# Patient Record
Sex: Female | Born: 1950
Health system: Southern US, Community
[De-identification: ages and names within clinical notes are randomized; demographics above are authoritative.]

## PROBLEM LIST (undated history)

## (undated) DIAGNOSIS — I10 Essential (primary) hypertension: Secondary | ICD-10-CM

## (undated) DIAGNOSIS — E119 Type 2 diabetes mellitus without complications: Secondary | ICD-10-CM

## (undated) DIAGNOSIS — M1990 Unspecified osteoarthritis, unspecified site: Secondary | ICD-10-CM

## (undated) DIAGNOSIS — R42 Dizziness and giddiness: Secondary | ICD-10-CM

## (undated) DIAGNOSIS — R002 Palpitations: Secondary | ICD-10-CM

## (undated) DIAGNOSIS — E785 Hyperlipidemia, unspecified: Secondary | ICD-10-CM

## (undated) DIAGNOSIS — K219 Gastro-esophageal reflux disease without esophagitis: Secondary | ICD-10-CM

## (undated) DIAGNOSIS — I2510 Atherosclerotic heart disease of native coronary artery without angina pectoris: Secondary | ICD-10-CM

## (undated) HISTORY — DX: Hyperlipidemia, unspecified: E78.5

## (undated) HISTORY — DX: Essential (primary) hypertension: I10

## (undated) HISTORY — DX: Palpitations: R00.2

## (undated) HISTORY — DX: Dizziness and giddiness: R42

## (undated) HISTORY — DX: Type 2 diabetes mellitus without complications: E11.9

---

## 1998-11-04 ENCOUNTER — Other Ambulatory Visit: Admission: RE | Admit: 1998-11-04 | Discharge: 1998-11-04 | Payer: Self-pay | Admitting: Family Medicine

## 1999-12-29 ENCOUNTER — Other Ambulatory Visit: Admission: RE | Admit: 1999-12-29 | Discharge: 1999-12-29 | Payer: Self-pay | Admitting: Family Medicine

## 2001-03-20 ENCOUNTER — Other Ambulatory Visit: Admission: RE | Admit: 2001-03-20 | Discharge: 2001-03-20 | Payer: Self-pay | Admitting: Family Medicine

## 2001-10-21 ENCOUNTER — Emergency Department (HOSPITAL_COMMUNITY): Admission: EM | Admit: 2001-10-21 | Discharge: 2001-10-21 | Payer: Self-pay | Admitting: Internal Medicine

## 2001-10-21 ENCOUNTER — Encounter: Payer: Self-pay | Admitting: Internal Medicine

## 2002-04-19 ENCOUNTER — Other Ambulatory Visit: Admission: RE | Admit: 2002-04-19 | Discharge: 2002-04-19 | Payer: Self-pay | Admitting: Family Medicine

## 2003-10-22 ENCOUNTER — Other Ambulatory Visit: Admission: RE | Admit: 2003-10-22 | Discharge: 2003-10-22 | Payer: Self-pay | Admitting: Family Medicine

## 2005-09-28 ENCOUNTER — Other Ambulatory Visit: Admission: RE | Admit: 2005-09-28 | Discharge: 2005-09-28 | Payer: Self-pay | Admitting: Family Medicine

## 2007-01-20 ENCOUNTER — Other Ambulatory Visit: Admission: RE | Admit: 2007-01-20 | Discharge: 2007-01-20 | Payer: Self-pay | Admitting: Family Medicine

## 2008-04-18 ENCOUNTER — Emergency Department (HOSPITAL_COMMUNITY): Admission: EM | Admit: 2008-04-18 | Discharge: 2008-04-18 | Payer: Self-pay | Admitting: Emergency Medicine

## 2010-12-06 ENCOUNTER — Encounter: Payer: Self-pay | Admitting: Family Medicine

## 2011-08-12 LAB — DIFFERENTIAL
Basophils Absolute: 0
Basophils Relative: 1
Eosinophils Relative: 4
Monocytes Absolute: 0.4
Neutro Abs: 2.8

## 2011-08-12 LAB — CBC
HCT: 37.1
Hemoglobin: 12.7
MCHC: 34.4
MCV: 82
Platelets: 359
RBC: 4.52
RDW: 15.4
WBC: 4.9

## 2011-08-12 LAB — URINALYSIS, ROUTINE W REFLEX MICROSCOPIC
Bilirubin Urine: NEGATIVE
Glucose, UA: NEGATIVE
Hgb urine dipstick: NEGATIVE
Ketones, ur: NEGATIVE
Nitrite: NEGATIVE
Protein, ur: NEGATIVE
Specific Gravity, Urine: 1.015
Urobilinogen, UA: 0.2
pH: 6.5

## 2011-08-12 LAB — BASIC METABOLIC PANEL
BUN: 10
CO2: 28
Calcium: 9.4
Chloride: 106
Creatinine, Ser: 0.69
GFR calc Af Amer: >60
GFR calc non Af Amer: >60
Glucose, Bld: 129 — ABNORMAL HIGH
Potassium: 4.3
Sodium: 141

## 2013-04-16 ENCOUNTER — Telehealth: Payer: Self-pay | Admitting: Nurse Practitioner

## 2013-04-16 NOTE — Telephone Encounter (Signed)
APPT MADE WITH MMM TOMORROW

## 2013-04-17 ENCOUNTER — Other Ambulatory Visit: Payer: Self-pay | Admitting: *Deleted

## 2013-04-17 ENCOUNTER — Encounter: Payer: Self-pay | Admitting: Nurse Practitioner

## 2013-04-17 ENCOUNTER — Ambulatory Visit (INDEPENDENT_AMBULATORY_CARE_PROVIDER_SITE_OTHER): Payer: BC Managed Care – PPO | Admitting: Nurse Practitioner

## 2013-04-17 VITALS — BP 142/82 | Temp 98.1°F | Ht 67.0 in | Wt 236.0 lb

## 2013-04-17 DIAGNOSIS — Z7985 Long-term (current) use of injectable non-insulin antidiabetic drugs: Secondary | ICD-10-CM | POA: Insufficient documentation

## 2013-04-17 DIAGNOSIS — E785 Hyperlipidemia, unspecified: Secondary | ICD-10-CM | POA: Insufficient documentation

## 2013-04-17 DIAGNOSIS — E119 Type 2 diabetes mellitus without complications: Secondary | ICD-10-CM | POA: Insufficient documentation

## 2013-04-17 DIAGNOSIS — I152 Hypertension secondary to endocrine disorders: Secondary | ICD-10-CM | POA: Insufficient documentation

## 2013-04-17 DIAGNOSIS — I10 Essential (primary) hypertension: Secondary | ICD-10-CM

## 2013-04-17 LAB — COMPLETE METABOLIC PANEL WITH GFR
AST: 18 U/L (ref 0–37)
Alkaline Phosphatase: 67 U/L (ref 39–117)
BUN: 8 mg/dL (ref 6–23)
Creat: 0.75 mg/dL (ref 0.50–1.10)

## 2013-04-17 MED ORDER — SITAGLIPTIN PHOS-METFORMIN HCL 50-1000 MG PO TABS: 1.0000 | ORAL_TABLET | Freq: Every morning | ORAL | Status: DC

## 2013-04-17 MED ORDER — GLIPIZIDE ER 10 MG PO TB24: 10.0000 mg | ORAL_TABLET | Freq: Every day | ORAL | Status: DC

## 2013-04-17 MED ORDER — LISINOPRIL 20 MG PO TABS: 20.0000 mg | ORAL_TABLET | Freq: Every day | ORAL | Status: DC

## 2013-04-17 MED ORDER — METFORMIN HCL 1000 MG PO TABS: 1000.0000 mg | ORAL_TABLET | Freq: Every day | ORAL | Status: DC

## 2013-04-17 MED ORDER — ROSUVASTATIN CALCIUM 20 MG PO TABS: 20.0000 mg | ORAL_TABLET | Freq: Every day | ORAL | Status: DC

## 2013-04-17 MED ORDER — MECLIZINE HCL 25 MG PO TABS: 25.0000 mg | ORAL_TABLET | Freq: Three times a day (TID) | ORAL | Status: DC | PRN

## 2013-04-17 NOTE — Progress Notes (Signed)
Subjective:    Patient ID: Erica Evans, female    DOB: [birth year], 62 y.o.   MRN: [id number]  Hyperlipidemia This is a chronic problem. The current episode started more than 1 year ago. The problem is uncontrolled. Recent lipid tests were reviewed and are high. Exacerbating diseases include diabetes and obesity. There are no known factors aggravating her hyperlipidemia. Pertinent negatives include no chest pain or shortness of breath. Current antihyperlipidemic treatment includes statins. The current treatment provides moderate improvement of lipids. Compliance problems include adherence to diet and adherence to exercise.  Risk factors for coronary artery disease include hypertension, diabetes mellitus, obesity and post-menopausal.  Hypertension This is a chronic problem. The current episode started more than 1 year ago. The problem is unchanged. The problem is uncontrolled. Pertinent negatives include no blurred vision, chest pain, headaches, peripheral edema, shortness of breath or sweats. There are no associated agents to hypertension. Risk factors for coronary artery disease include diabetes mellitus, dyslipidemia, obesity, post-menopausal state and sedentary lifestyle. Past treatments include ACE inhibitors. The current treatment provides moderate improvement. Compliance problems include diet and exercise.   Diabetes She presents for her follow-up diabetic visit. She has type 2 diabetes mellitus. No MedicAlert identification noted. The initial diagnosis of diabetes was made 10 years ago. There are no hypoglycemic associated symptoms. Pertinent negatives for hypoglycemia include no headaches or sweats. Pertinent negatives for diabetes include no blurred vision, no chest pain, no polydipsia, no polyphagia, no polyuria, no weakness and no weight loss. There are no hypoglycemic complications. Symptoms are stable. There are no diabetic complications. Risk factors for coronary artery disease include  dyslipidemia, hypertension, obesity, post-menopausal and sedentary lifestyle. Current diabetic treatment includes oral agent (triple therapy). She is compliant with treatment all of the time. Her weight is stable. She is following a generally healthy diet. When asked about meal planning, she reported none. She has not had a previous visit with a dietician. She rarely participates in exercise. Her breakfast blood glucose is taken between 8-9 am. Her breakfast blood glucose range is generally 130-140 mg/dl. Her highest blood glucose is 140-180 mg/dl. Her overall blood glucose range is 130-140 mg/dl. An ACE inhibitor/angiotensin II receptor blocker is being taken. She does not see a podiatrist.Eye exam is current (over a year ago- patient encouraged to make appointment.).      Review of Systems  Constitutional: Negative for weight loss.  Eyes: Negative for blurred vision.  Respiratory: Negative for shortness of breath.   Cardiovascular: Negative for chest pain.  Endocrine: Negative for polydipsia, polyphagia and polyuria.  Neurological: Negative for weakness and headaches.  All other systems reviewed and are negative.       Objective:   Physical Exam  Constitutional: She is oriented to person, place, and time. She appears well-developed and well-nourished.  HENT:  Nose: Nose normal.  Mouth/Throat: Oropharynx is clear and moist.  Eyes: EOM are normal.  Neck: Trachea normal, normal range of motion and full passive range of motion without pain. Neck supple. No JVD present. Carotid bruit is not present. No thyromegaly present.  Cardiovascular: Normal rate, normal heart sounds and intact distal pulses.  Exam reveals no gallop and no friction rub.   No murmur heard. Regular irregularity  Pulmonary/Chest: Effort normal and breath sounds normal.  Abdominal: Soft. Bowel sounds are normal. She exhibits no distension and no mass. There is no tenderness.  Musculoskeletal: Normal range of motion.   Lymphadenopathy:    She has no cervical adenopathy.  Neurological: She  is alert and oriented to person, place, and time. She has normal reflexes.  Skin: Skin is warm and dry.  Psychiatric: She has a normal mood and affect. Her behavior is normal. Judgment and thought content normal.  BP 142/82  Temp(Src) 98.1 F (36.7 C) (Oral)  Ht 5\' 7"  (1.702 m)  Wt 236 lb (107.049 kg)  BMI 36.95 kg/m2 See diabetic foot exam documentation Results for orders placed in visit on 04/17/13  POCT GLYCOSYLATED HEMOGLOBIN (HGB A1C)      Result Value Range   Hemoglobin A1C 7.4%            Assessment & Plan:  1. HTN (hypertension) Low NA+ diet - COMPLETE METABOLIC PANEL WITH GFR - lisinopril (PRINIVIL,ZESTRIL) 20 MG tablet; Take 1 tablet (20 mg total) by mouth daily.  Dispense: 90 tablet; Refill: 1  2. Other and unspecified hyperlipidemia Low fat diet and exercise - NMR Lipoprofile with Lipids - rosuvastatin (CRESTOR) 20 MG tablet; Take 1 tablet (20 mg total) by mouth daily.  Dispense: 90 tablet; Refill: 1  3. Diabetes Count carbs - POCT glycosylated hemoglobin (Hb A1C) - glipiZIDE (GLUCOTROL XL) 10 MG 24 hr tablet; Take 1 tablet (10 mg total) by mouth daily.  Dispense: 90 tablet; Refill: 1 - metFORMIN (GLUCOPHAGE) 1000 MG tablet; Take 1 tablet (1,000 mg total) by mouth at bedtime.  Dispense: 90 tablet; Refill: 1 - sitaGLIPtan-metformin (JANUMET) 50-1000 MG per tablet; Take 1 tablet by mouth every morning.  Dispense: 90 tablet; Refill: 1  Mary-Margaret Daphine Deutscher, FNP

## 2013-04-17 NOTE — Patient Instructions (Signed)
Diets for Diabetes, Food Labeling Look at food labels to help you decide how much of a product you can eat. You will want to check the amount of total carbohydrate in a serving to see how the food fits into your meal plan. In the list of ingredients, the ingredient present in the largest amount by weight must be listed first, followed by the other ingredients in descending order. STANDARD OF IDENTITY Most products have a list of ingredients. However, foods that the Food and Drug Administration (FDA) has given a standard of identity do not need a list of ingredients. A standard of identity means that a food must contain certain ingredients if it is called a particular name. Examples are mayonnaise, peanut butter, ketchup, jelly, and cheese. LABELING TERMS There are many terms found on food labels. Some of these terms have specific definitions. Some terms are regulated by the FDA, and the FDA has clearly specified how they can be used. Others are not regulated or well-defined and can be misleading and confusing. SPECIFICALLY DEFINED TERMS Nutritive Sweetener.  A sweetener that contains calories,such as table sugar or honey. Nonnutritive Sweetener.  A sweetener with few or no calories,such as saccharin, aspartame, sucralose, and cyclamate. LABELING TERMS REGULATED BY THE FDA Free.  The product contains only a tiny or small amount of fat, cholesterol, sodium, sugar, or calories. For example, a "fat-free" product will contain less than 0.5 g of fat per serving. Low.  A food described as "low" in fat, saturated fat, cholesterol, sodium, or calories could be eaten fairly often without exceeding dietary guidelines. For example, "low in fat" means no more than 3 g of fat per serving. Lean.  "Lean" and "extra lean" are U.S. Department of Agriculture (USDA) terms for use on meat and poultry products. "Lean" means the product contains less than 10 g of fat, 4 g of saturated fat, and 95 mg of cholesterol  per serving. "Lean" is not as low in fat as a product labeled "low." Extra Lean.  "Extra lean" means the product contains less than 5 g of fat, 2 g of saturated fat, and 95 mg of cholesterol per serving. While "extra lean" has less fat than "lean," it is still higher in fat than a product labeled "low." Reduced, Less, Fewer.  A diet product that contains 25% less of a nutrient or calories than the regular version. For example, hot dogs might be labeled "25% less fat than our regular hot dogs." Light/Lite.  A diet product that contains  fewer calories or  the fat of the original. For example, "light in sodium" means a product with  the usual sodium. More.  One serving contains at least 10% more of the daily value of a vitamin, mineral, or fiber than usual. Good Source Of.  One serving contains 10% to 19% of the daily value for a particular vitamin, mineral, or fiber. Excellent Source Of.  One serving contains 20% or more of the daily value for a particular nutrient. Other terms used might be "high in" or "rich in." Enriched or Fortified.  The product contains added vitamins, minerals, or protein. Nutrition labeling must be used on enriched or fortified foods. Imitation.  The product has been altered so that it is lower in protein, vitamins, or minerals than the usual food,such as imitation peanut butter. Total Fat.  The number listed is the total of all fat found in a serving of the product. Under total fat, food labels must list saturated fat and   trans fat, which are associated with raising bad cholesterol and an increased risk of heart blood vessel disease. Saturated Fat.  Mainly fats from animal-based sources. Some examples are red meat, cheese, cream, whole milk, and coconut oil. Trans Fat.  Found in some fried snack foods, packaged foods, and fried restaurant foods. It is recommended you eat as close to 0 g of trans fat as possible, since it raises bad cholesterol and lowers  good cholesterol. Polyunsaturated and Monounsaturated Fats.  More healthful fats. These fats are from plant sources. Total Carbohydrate.  The number of carbohydrate grams in a serving of the product. Under total carbohydrate are listed the other carbohydrate sources, such as dietary fiber and sugars. Dietary Fiber.  A carbohydrate from plant sources. Sugars.  Sugars listed on the label contain all naturally occurring sugars as well as added sugars. LABELING TERMS NOT REGULATED BY THE FDA Sugarless.  Table sugar (sucrose) has not been added. However, the manufacturer may use another form of sugar in place of sucrose to sweeten the product. For example, sugar alcohols are used to sweeten foods. Sugar alcohols are a form of sugar but are not table sugar. If a product contains sugar alcohols in place of sucrose, it can still be labeled "sugarless." Low Salt, Salt-Free, Unsalted, No Salt, No Salt Added, Without Added Salt.  Food that is usually processed with salt has been made without salt. However, the food may contain sodium-containing additives, such as preservatives, leavening agents, or flavorings. Natural.  This term has no legal meaning. Organic.  Foods that are certified as organic have been inspected and approved by the USDA to ensure they are produced without pesticides, fertilizers containing synthetic ingredients, bioengineering, or ionizing radiation. Document Released: 11/04/2003 Document Revised: 01/24/2012 Document Reviewed: 05/22/2009 [employer] Patient Information 2014 [employer].  

## 2013-04-18 LAB — NMR LIPOPROFILE WITH LIPIDS
Cholesterol, Total: 119 mg/dL (ref <200)
LDL (calc): 40 mg/dL (ref <100)
LDL Particle Number: 704 nmol/L (ref <1000)
LP-IR Score: 63 — ABNORMAL HIGH (ref <=45)
Triglycerides: 148 mg/dL (ref <150)
VLDL Size: 48.3 nm — ABNORMAL HIGH (ref <=46.6)

## 2013-04-19 ENCOUNTER — Telehealth: Payer: Self-pay | Admitting: Nurse Practitioner

## 2013-04-19 NOTE — Telephone Encounter (Signed)
lmtcb- 6/5-jhb

## 2013-04-25 ENCOUNTER — Other Ambulatory Visit (INDEPENDENT_AMBULATORY_CARE_PROVIDER_SITE_OTHER): Payer: BC Managed Care – PPO

## 2013-04-25 DIAGNOSIS — R7989 Other specified abnormal findings of blood chemistry: Secondary | ICD-10-CM

## 2013-04-25 LAB — BASIC METABOLIC PANEL WITH GFR
Chloride: 100 mEq/L (ref 96–112)
Creat: 0.72 mg/dL (ref 0.50–1.10)
GFR, Est Non African American: >89 mL/min
Potassium: 4.8 mEq/L (ref 3.5–5.3)

## 2013-04-25 NOTE — Progress Notes (Unsigned)
Patient came in for re-check for potassium

## 2013-05-03 ENCOUNTER — Telehealth: Payer: Self-pay | Admitting: Nurse Practitioner

## 2013-05-03 NOTE — Telephone Encounter (Signed)
Samples up front 

## 2013-05-08 ENCOUNTER — Telehealth: Payer: Self-pay

## 2013-05-08 ENCOUNTER — Telehealth: Payer: Self-pay | Admitting: Nurse Practitioner

## 2013-05-08 DIAGNOSIS — I499 Cardiac arrhythmia, unspecified: Secondary | ICD-10-CM

## 2013-05-08 NOTE — Telephone Encounter (Signed)
Want to get referral now for irregular heartbeat to [facility]

## 2013-05-08 NOTE — Telephone Encounter (Signed)
Patient aware referral made.

## 2013-05-08 NOTE — Telephone Encounter (Signed)
Referral made 

## 2013-07-09 ENCOUNTER — Ambulatory Visit (INDEPENDENT_AMBULATORY_CARE_PROVIDER_SITE_OTHER): Payer: BC Managed Care – PPO | Admitting: Cardiovascular Disease

## 2013-07-09 ENCOUNTER — Encounter: Payer: Self-pay | Admitting: Cardiovascular Disease

## 2013-07-09 ENCOUNTER — Other Ambulatory Visit: Payer: Self-pay

## 2013-07-09 ENCOUNTER — Encounter (INDEPENDENT_AMBULATORY_CARE_PROVIDER_SITE_OTHER): Payer: BC Managed Care – PPO

## 2013-07-09 ENCOUNTER — Encounter: Payer: Self-pay | Admitting: *Deleted

## 2013-07-09 ENCOUNTER — Ambulatory Visit (HOSPITAL_COMMUNITY): Payer: BC Managed Care – PPO | Attending: Internal Medicine | Admitting: Radiology

## 2013-07-09 VITALS — BP 152/72 | HR 65 | Wt 246.0 lb

## 2013-07-09 DIAGNOSIS — R002 Palpitations: Secondary | ICD-10-CM | POA: Insufficient documentation

## 2013-07-09 DIAGNOSIS — I4510 Unspecified right bundle-branch block: Secondary | ICD-10-CM | POA: Insufficient documentation

## 2013-07-09 DIAGNOSIS — R079 Chest pain, unspecified: Secondary | ICD-10-CM | POA: Insufficient documentation

## 2013-07-09 DIAGNOSIS — Z87891 Personal history of nicotine dependence: Secondary | ICD-10-CM | POA: Insufficient documentation

## 2013-07-09 DIAGNOSIS — I10 Essential (primary) hypertension: Secondary | ICD-10-CM | POA: Insufficient documentation

## 2013-07-09 DIAGNOSIS — R0609 Other forms of dyspnea: Secondary | ICD-10-CM

## 2013-07-09 DIAGNOSIS — R9431 Abnormal electrocardiogram [ECG] [EKG]: Secondary | ICD-10-CM

## 2013-07-09 DIAGNOSIS — R0602 Shortness of breath: Secondary | ICD-10-CM

## 2013-07-09 DIAGNOSIS — E785 Hyperlipidemia, unspecified: Secondary | ICD-10-CM

## 2013-07-09 DIAGNOSIS — E119 Type 2 diabetes mellitus without complications: Secondary | ICD-10-CM | POA: Insufficient documentation

## 2013-07-09 DIAGNOSIS — R55 Syncope and collapse: Secondary | ICD-10-CM

## 2013-07-09 DIAGNOSIS — R0600 Dyspnea, unspecified: Secondary | ICD-10-CM | POA: Insufficient documentation

## 2013-07-09 NOTE — Assessment & Plan Note (Signed)
In setting of diabetes and multiple cardiac symptoms including palpitations and dyspnea needs myovue

## 2013-07-09 NOTE — Assessment & Plan Note (Signed)
Seems to be the root casue of her pressure and dyspnea  F/u event monitor  Intermitant nature requires event monitor and not holter

## 2013-07-09 NOTE — Progress Notes (Signed)
Patient ID: Erica Evans, female   DOB: 1951/11/06, 62 y.o.   MRN: [id number] 62 yo referred by primary for irregular heart beat.  CRF;s elevated lipids DM and HTN She has let her self go quite a bit.  Sedentary with weight gain.  Husband has hydrocephalus and she is caring for him  Palpitations most days.  Fluttering feeling in throat with dyspnea and pressure.  Exacerbated by exertion.  Has had pre syncope that has been atributed to vertigo in past No previous ETT.  Palpitatoins worse last few months.  Pressure in chest associated with rapid palpitations and fluttering.    ROS: Denies fever, malais, weight loss, blurry vision, decreased visual acuity, cough, sputum, SOB, hemoptysis, pleuritic pain, palpitaitons, heartburn, abdominal pain, melena, lower extremity edema, claudication, or rash.  All other systems reviewed and negative   General: Affect appropriate Obese white female HEENT: normal Neck supple with no adenopathy JVP normal no bruits no thyromegaly Lungs clear with no wheezing and good diaphragmatic motion Heart:  S1/S2 no murmur,rub, gallop or click PMI normal Abdomen: benighn, BS positve, no tenderness, no AAA no bruit.  No HSM or HJR Distal pulses intact with no bruits No edema Neuro non-focal Skin warm and dry No muscular weakness  Medications Current Outpatient Prescriptions  Medication Sig Dispense Refill  . Cholecalciferol (VITAMIN D) 2000 UNITS CAPS Take 1 capsule by mouth daily.      Marland Kitchen glipiZIDE (GLUCOTROL XL) 10 MG 24 hr tablet Take 1 tablet (10 mg total) by mouth daily.  90 tablet  1  . lisinopril (PRINIVIL,ZESTRIL) 20 MG tablet Take 1 tablet (20 mg total) by mouth daily.  90 tablet  1  . meclizine (ANTIVERT) 25 MG tablet Take 1 tablet (25 mg total) by mouth 3 (three) times daily as needed.  30 tablet  3  . rosuvastatin (CRESTOR) 20 MG tablet Take 10 mg by mouth daily.      . sitaGLIPtan-metformin (JANUMET) 50-1000 MG per tablet Take 1 tablet by mouth every  morning.  90 tablet  1  . Sulfamethoxazole-Trimethoprim (BACTRIM PO) Take by mouth as directed.       No current facility-administered medications for this visit.    Allergies Lipitor and Morphine and related  Family History: Family History  Problem Relation Age of Onset  . Coronary artery disease Mother   . Diabetes Mother   . Coronary artery disease Father   . Diabetes Sister     Social History: History   Social History  . Marital Status: Married    Spouse Name: N/A    Number of Children: N/A  . Years of Education: N/A   Occupational History  . Not on file.   Social History Main Topics  . Smoking status: Former Games developer  . Smokeless tobacco: Not on file  . Alcohol Use: No  . Drug Use: No  . Sexual Activity: Not on file   Other Topics Concern  . Not on file   Social History Narrative  . No narrative on file    Electrocardiogram: SR rate 65 Low voltage nonspecific ST/T wave changes  Assessment and Plan

## 2013-07-09 NOTE — Assessment & Plan Note (Signed)
Cholesterol is at goal.  Continue current dose of statin and diet Rx.  No myalgias or side effects.  F/U  LFT's in 6 months. Lab Results  Component Value Date   LDLCALC 40 04/17/2013

## 2013-07-09 NOTE — Assessment & Plan Note (Signed)
Well controlled.  Continue current medications and low sodium Dash type diet.  I suspect she will have HTN response to exercise

## 2013-07-09 NOTE — Assessment & Plan Note (Signed)
Discussed low carb diet.  Target hemoglobin A1c is 6.5 or less.  Continue current medications.  

## 2013-07-09 NOTE — Assessment & Plan Note (Signed)
Seems funcitonal F/U echo for RV and LV function

## 2013-07-09 NOTE — Progress Notes (Signed)
Patient ID: Erica Evans, female   DOB: 03-17-1951, 62 y.o.   MRN: [id number] E-Cardio verite 30 day cardiac event monitor applied to patient.

## 2013-07-09 NOTE — Progress Notes (Signed)
Echocardiogram performed by Melissa Al-Rammal, RVT, RDMS.   Erica Evans, RT-N

## 2013-07-09 NOTE — Patient Instructions (Signed)
Your physician recommends that you schedule a follow-up appointment in: NEXT AVAILABLE Your physician recommends that you continue on your current medications as directed. Please refer to the Current Medication list given to you today.  Your physician has requested that you have an echocardiogram. Echocardiography is a painless test that uses sound waves to create images of your heart. It provides your doctor with information about the size and shape of your heart and how well your heart's chambers and valves are working. This procedure takes approximately one hour. There are no restrictions for this procedure.  Your physician has recommended that you wear an event monitor. Event monitors are medical devices that record the heart's electrical activity. Doctors most often [place] these monitors to diagnose arrhythmias. Arrhythmias are problems with the speed or rhythm of the heartbeat. The monitor is a small, portable device. You can wear one while you do your normal daily activities. This is usually used to diagnose what is causing palpitations/syncope (passing out). X 2 WEEKS  Your physician has requested that you have en exercise stress myoview. For further information please visit [email or web address] Please follow instruction sheet, as given.

## 2013-07-10 ENCOUNTER — Other Ambulatory Visit (HOSPITAL_COMMUNITY): Payer: BC Managed Care – PPO

## 2013-07-18 ENCOUNTER — Ambulatory Visit (HOSPITAL_COMMUNITY): Payer: BC Managed Care – PPO | Attending: Cardiovascular Disease | Admitting: Radiology

## 2013-07-18 DIAGNOSIS — R9431 Abnormal electrocardiogram [ECG] [EKG]: Secondary | ICD-10-CM

## 2013-07-18 DIAGNOSIS — R079 Chest pain, unspecified: Secondary | ICD-10-CM

## 2013-07-18 DIAGNOSIS — R0989 Other specified symptoms and signs involving the circulatory and respiratory systems: Secondary | ICD-10-CM

## 2013-07-18 MED ORDER — TECHNETIUM TC 99M SESTAMIBI GENERIC - CARDIOLITE
30.0000 | Freq: Once | INTRAVENOUS | Status: AC | PRN
  Administered 2013-07-18: 30 via INTRAVENOUS

## 2013-07-19 ENCOUNTER — Other Ambulatory Visit: Payer: Self-pay

## 2013-07-19 ENCOUNTER — Ambulatory Visit (HOSPITAL_COMMUNITY): Payer: BC Managed Care – PPO | Attending: Cardiology | Admitting: Radiology

## 2013-07-19 VITALS — BP 148/75 | HR 70 | Ht 67.0 in | Wt 244.0 lb

## 2013-07-19 DIAGNOSIS — R55 Syncope and collapse: Secondary | ICD-10-CM | POA: Insufficient documentation

## 2013-07-19 DIAGNOSIS — R110 Nausea: Secondary | ICD-10-CM | POA: Insufficient documentation

## 2013-07-19 DIAGNOSIS — R0602 Shortness of breath: Secondary | ICD-10-CM

## 2013-07-19 DIAGNOSIS — Z8249 Family history of ischemic heart disease and other diseases of the circulatory system: Secondary | ICD-10-CM | POA: Insufficient documentation

## 2013-07-19 DIAGNOSIS — R000 Tachycardia, unspecified: Secondary | ICD-10-CM | POA: Insufficient documentation

## 2013-07-19 DIAGNOSIS — R079 Chest pain, unspecified: Secondary | ICD-10-CM

## 2013-07-19 DIAGNOSIS — R5381 Other malaise: Secondary | ICD-10-CM | POA: Insufficient documentation

## 2013-07-19 DIAGNOSIS — E785 Hyperlipidemia, unspecified: Secondary | ICD-10-CM | POA: Insufficient documentation

## 2013-07-19 DIAGNOSIS — E119 Type 2 diabetes mellitus without complications: Secondary | ICD-10-CM | POA: Insufficient documentation

## 2013-07-19 DIAGNOSIS — Z87891 Personal history of nicotine dependence: Secondary | ICD-10-CM | POA: Insufficient documentation

## 2013-07-19 DIAGNOSIS — R0789 Other chest pain: Secondary | ICD-10-CM | POA: Insufficient documentation

## 2013-07-19 DIAGNOSIS — R0609 Other forms of dyspnea: Secondary | ICD-10-CM | POA: Insufficient documentation

## 2013-07-19 DIAGNOSIS — R0989 Other specified symptoms and signs involving the circulatory and respiratory systems: Secondary | ICD-10-CM | POA: Insufficient documentation

## 2013-07-19 DIAGNOSIS — R002 Palpitations: Secondary | ICD-10-CM | POA: Insufficient documentation

## 2013-07-19 DIAGNOSIS — R42 Dizziness and giddiness: Secondary | ICD-10-CM | POA: Insufficient documentation

## 2013-07-19 DIAGNOSIS — I10 Essential (primary) hypertension: Secondary | ICD-10-CM | POA: Insufficient documentation

## 2013-07-19 MED ORDER — TECHNETIUM TC 99M SESTAMIBI GENERIC - CARDIOLITE
30.0000 | Freq: Once | INTRAVENOUS | Status: AC | PRN
  Administered 2013-07-19: 30 via INTRAVENOUS

## 2013-07-19 NOTE — Telephone Encounter (Signed)
Last seen 05/14/13  MMM  Not on EPIC med list

## 2013-07-19 NOTE — Progress Notes (Signed)
[facility] SITE [address] NUCLEAR MED [address] [place], [place] [address] [phone]    Cardiology Nuclear Med Study  Erica Evans is a 62 y.o. female     MRN : [id number]     DOB: 1950-11-18  Procedure Date: 07/19/2013  Nuclear Med Background Indication for Stress Test:  Evaluation for Ischemia History: No prior known history of CAD,10 yrs ago GXT: Normal per patient at [facility], 07-09-13 Echo: EF=60-65% Cardiac Risk Factors: Strong Family History - CAD, History of Smoking, Hypertension, Lipids and NIDDM  Symptoms:Chest Pressure with/without exertion (last occurrence 1 to 2 weeks ago),   Dizziness, DOE, Fatigue, Fatigue with Exertion, Nausea, Near Syncope, Palpitations, Rapid HR and SOB   Nuclear Pre-Procedure Caffeine/Decaff Intake:  None NPO After: 8:00pm   Lungs:  clear O2 Sat: 99% on room air. IV 0.9% NS with Angio Cath:  22g  IV Site: R Hand  IV Started by:  Bonnita Levan, RN  Chest Size (in):  46 Cup Size: C  Height: 5\' 7"  (1.702 m)  Weight:  244 lb (110.678 kg)  BMI:  Body mass index is 38.21 kg/(m^2). Tech Comments:  N/A    Nuclear Med Study 1 or 2 day study: 2 day  Stress Test Type:  Stress  Reading MD: Marca Ancona, MD  Order Authorizing Provider:  Charlton Haws, MD  Resting Radionuclide: Technetium 20m Sestamibi  Resting Radionuclide Dose: 33.0 mCi  07/18/13  Stress Radionuclide:  Technetium 61m Sestamibi  Stress Radionuclide Dose: 33.0 mCi  07/19/13          Stress Protocol Rest HR: 70 Stress HR: 155  Rest BP: 148/75 Stress BP: 216/72  Exercise Time (min): 4:30 METS: 5.6   Predicted Max HR: 160 bpm % Max HR: 96.88 bpm Rate Pressure Product: 28413   Dose of Adenosine (mg):  n/a Dose of Lexiscan: n/a mg  Dose of Atropine (mg): n/a Dose of Dobutamine: n/a mcg/kg/min (at max HR)  Stress Test Technologist: Irean Hong, RN  Nuclear Technologist:  Doyne Keel, CNMT     Rest Procedure:  Myocardial perfusion imaging was performed at rest 45  minutes following the intravenous administration of Technetium 60m Sestamibi. Rest ECG: NSR - Normal EKG  Stress Procedure:  The patient exercised on the treadmill utilizing the Bruce Protocol for 4:30 minutes, RPE=15. The patient stopped due to DOE and denied any chest pain.  There was a marked hypertensive response to exercise on lisinopril.Technetium 60m Sestamibi was injected at peak exercise and myocardial perfusion imaging was performed after a brief delay. Stress ECG: Insignificant upsloping ST segment depression.  QPS Raw Data Images:  Normal; no motion artifact; normal heart/lung ratio. Stress Images:  Small, mild apical septal perfusion defect. Rest Images:  Small, mild apical septal perfusion defect, less prominent than on stress images.  Subtraction (SDS):  Small, mild partially reversible apical septal perfusion defect.  Transient Ischemic Dilatation (Normal <1.22):  n/a Lung/Heart Ratio (Normal <0.45):  0.45  Quantitative Gated Spect Images QGS EDV:  n/a ml QGS ESV:  n/a ml  Impression Exercise Capacity:  Poor exercise capacity. BP Response:  Normal blood pressure response. Clinical Symptoms:  Exertional dyspnea.  ECG Impression:  Insignificant upsloping ST segment depression. Comparison with Prior Nuclear Study: No previous nuclear study performed  Overall Impression:  Low risk stress nuclear study with small, mild partially reversible perfusion defect.  This could represent shifting breast artifact but cannot rule out a small area of ischemia. .  LV Ejection Fraction: Study  not gated.  LV Wall Motion:  NL LV Function; NL Wall Motion  Marca Ancona 07/19/2013

## 2013-07-25 MED ORDER — BUSPIRONE HCL 15 MG PO TABS: ORAL_TABLET | ORAL | Status: DC

## 2013-08-13 ENCOUNTER — Telehealth: Payer: Self-pay | Admitting: *Deleted

## 2013-08-13 NOTE — Telephone Encounter (Signed)
PT AWARE OR MONITOR  RESULTS./CY

## 2013-08-13 NOTE — Telephone Encounter (Signed)
LMTCB  RE   MONITOR RESULTS PER DR NISHAN  SR  OCC  PAC'S  NO  SIG  ARRHYTHMIA./CY

## 2013-08-16 ENCOUNTER — Other Ambulatory Visit: Payer: Self-pay | Admitting: *Deleted

## 2013-08-16 MED ORDER — BUSPIRONE HCL 15 MG PO TABS: ORAL_TABLET | ORAL | Status: DC

## 2013-10-13 ENCOUNTER — Other Ambulatory Visit: Payer: Self-pay | Admitting: Nurse Practitioner

## 2013-11-02 ENCOUNTER — Ambulatory Visit: Payer: BC Managed Care – PPO | Admitting: Nurse Practitioner

## 2013-11-05 ENCOUNTER — Other Ambulatory Visit: Payer: Self-pay | Admitting: Nurse Practitioner

## 2013-12-11 LAB — HM DIABETES EYE EXAM

## 2013-12-13 ENCOUNTER — Other Ambulatory Visit: Payer: Self-pay | Admitting: Nurse Practitioner

## 2014-01-10 ENCOUNTER — Other Ambulatory Visit: Payer: Self-pay | Admitting: Nurse Practitioner

## 2014-01-12 ENCOUNTER — Other Ambulatory Visit: Payer: Self-pay | Admitting: Nurse Practitioner

## 2014-01-15 NOTE — Telephone Encounter (Signed)
Last seen 04/17/13  MMM   Last glucose 04/25/13

## 2014-01-15 NOTE — Telephone Encounter (Signed)
No more refills without being seen 

## 2014-02-06 ENCOUNTER — Other Ambulatory Visit: Payer: Self-pay | Admitting: *Deleted

## 2014-02-06 MED ORDER — LISINOPRIL 20 MG PO TABS: ORAL_TABLET | ORAL | Status: DC

## 2014-02-25 ENCOUNTER — Telehealth: Payer: Self-pay | Admitting: Nurse Practitioner

## 2014-02-26 MED ORDER — SITAGLIPTIN PHOS-METFORMIN HCL 50-1000 MG PO TABS: ORAL_TABLET | ORAL | Status: DC

## 2014-02-26 NOTE — Telephone Encounter (Signed)
rx sent to pharmacy

## 2014-03-11 ENCOUNTER — Ambulatory Visit (INDEPENDENT_AMBULATORY_CARE_PROVIDER_SITE_OTHER): Payer: BC Managed Care – PPO | Admitting: Nurse Practitioner

## 2014-03-11 ENCOUNTER — Encounter: Payer: Self-pay | Admitting: Nurse Practitioner

## 2014-03-11 VITALS — BP 146/84 | Temp 97.2°F | Ht 67.0 in | Wt 243.0 lb

## 2014-03-11 DIAGNOSIS — I10 Essential (primary) hypertension: Secondary | ICD-10-CM

## 2014-03-11 DIAGNOSIS — F411 Generalized anxiety disorder: Secondary | ICD-10-CM

## 2014-03-11 DIAGNOSIS — E119 Type 2 diabetes mellitus without complications: Secondary | ICD-10-CM

## 2014-03-11 DIAGNOSIS — E785 Hyperlipidemia, unspecified: Secondary | ICD-10-CM

## 2014-03-11 LAB — POCT GLYCOSYLATED HEMOGLOBIN (HGB A1C): Hemoglobin A1C: 8.7

## 2014-03-11 LAB — POCT UA - MICROALBUMIN: Microalbumin Ur, POC: 20 mg/L

## 2014-03-11 MED ORDER — GLIPIZIDE ER 10 MG PO TB24: ORAL_TABLET | ORAL | Status: DC

## 2014-03-11 MED ORDER — LISINOPRIL 20 MG PO TABS: ORAL_TABLET | ORAL | Status: DC

## 2014-03-11 MED ORDER — ROSUVASTATIN CALCIUM 20 MG PO TABS: 10.0000 mg | ORAL_TABLET | Freq: Every day | ORAL | Status: DC

## 2014-03-11 MED ORDER — BUSPIRONE HCL 15 MG PO TABS: ORAL_TABLET | ORAL | Status: DC

## 2014-03-11 MED ORDER — SITAGLIPTIN PHOS-METFORMIN HCL 50-1000 MG PO TABS: ORAL_TABLET | ORAL | Status: DC

## 2014-03-11 NOTE — Progress Notes (Signed)
Subjective:    Patient ID: Erica Evans, female    DOB: [birth year], 63 y.o.   MRN: [id number]  Patient here today for follow up of chronic medical problems.  Hypertension This is a chronic problem. The current episode started more than 1 year ago. The problem is unchanged. The problem is uncontrolled. Pertinent negatives include no blurred vision, chest pain, headaches, peripheral edema, shortness of breath or sweats. There are no associated agents to hypertension. Risk factors for coronary artery disease include diabetes mellitus, dyslipidemia, obesity, post-menopausal state and sedentary lifestyle. Past treatments include ACE inhibitors. The current treatment provides moderate improvement. Compliance problems include diet and exercise.   Hyperlipidemia This is a chronic problem. The current episode started more than 1 year ago. The problem is uncontrolled. Recent lipid tests were reviewed and are high. Exacerbating diseases include diabetes and obesity. There are no known factors aggravating her hyperlipidemia. Pertinent negatives include no chest pain or shortness of breath. Current antihyperlipidemic treatment includes statins. The current treatment provides moderate improvement of lipids. Compliance problems include adherence to diet and adherence to exercise.  Risk factors for coronary artery disease include hypertension, diabetes mellitus, obesity and post-menopausal.  Diabetes She presents for her follow-up diabetic visit. She has type 2 diabetes mellitus. No MedicAlert identification noted. The initial diagnosis of diabetes was made 10 years ago. There are no hypoglycemic associated symptoms. Pertinent negatives for hypoglycemia include no headaches or sweats. Pertinent negatives for diabetes include no blurred vision, no chest pain, no polydipsia, no polyphagia, no polyuria, no weakness and no weight loss. There are no hypoglycemic complications. Symptoms are stable. There are no diabetic  complications. Risk factors for coronary artery disease include dyslipidemia, hypertension, obesity, post-menopausal and sedentary lifestyle. Current diabetic treatment includes oral agent (triple therapy). She is compliant with treatment all of the time. Her weight is stable. She is following a generally healthy diet. When asked about meal planning, she reported none. She has not had a previous visit with a dietician. She rarely participates in exercise. Her breakfast blood glucose is taken between 8-9 am. Her breakfast blood glucose range is generally 180-200 mg/dl. Her highest blood glucose is 180-200 mg/dl. Her overall blood glucose range is 180-200 mg/dl. An ACE inhibitor/angiotensin II receptor blocker is being taken. She does not see a podiatrist.Eye exam is current (over a year ago- patient encouraged to make appointment.).      Review of Systems  Constitutional: Negative for weight loss.  Eyes: Negative for blurred vision.  Respiratory: Negative for shortness of breath.   Cardiovascular: Negative for chest pain.  Endocrine: Negative for polydipsia, polyphagia and polyuria.  Neurological: Negative for weakness and headaches.  All other systems reviewed and are negative.      Objective:   Physical Exam  Constitutional: She is oriented to person, place, and time. She appears well-developed and well-nourished.  HENT:  Nose: Nose normal.  Mouth/Throat: Oropharynx is clear and moist.  Eyes: EOM are normal.  Neck: Trachea normal, normal range of motion and full passive range of motion without pain. Neck supple. No JVD present. Carotid bruit is not present. No thyromegaly present.  Cardiovascular: Normal rate, normal heart sounds and intact distal pulses.  Exam reveals no gallop and no friction rub.   No murmur heard. Regular irregularity  Pulmonary/Chest: Effort normal and breath sounds normal.  Abdominal: Soft. Bowel sounds are normal. She exhibits no distension and no mass. There is  no tenderness.  Musculoskeletal: Normal range of motion.  Lymphadenopathy:  She has no cervical adenopathy.  Neurological: She is alert and oriented to person, place, and time. She has normal reflexes.  Skin: Skin is warm and dry.  Psychiatric: She has a normal mood and affect. Her behavior is normal. Judgment and thought content normal.  BP 146/84  Temp(Src) 97.2 F (36.2 C) (Oral)  Ht 5' 7" (1.702 m)  Wt 243 lb (110.224 kg)  BMI 38.05 kg/m2 See diabetic foot exam documentation Results for orders placed in visit on 03/11/14  POCT GLYCOSYLATED HEMOGLOBIN (HGB A1C)      Result Value Ref Range   Hemoglobin A1C 8.7%            Assessment & Plan:   1. Diabetes   2. Hyperlipidemia   3. Hypertension   4. GAD (generalized anxiety disorder)    Orders Placed This Encounter  Procedures  . CMP14+EGFR  . NMR, lipoprofile  . POCT glycosylated hemoglobin (Hb A1C)  . HM DIABETES EYE EXAM    This external order was created through the Results Console.   Meds ordered this encounter  Medications  . DISCONTD: metFORMIN (GLUCOPHAGE) 1000 MG tablet    Sig: Take 1,000 mg by mouth daily.  . busPIRone (BUSPAR) 15 MG tablet    Sig: Take 1/2 to 1 tablet every 8 hours for nerves    Dispense:  60 tablet    Refill:  2    Order Specific Question:  Supervising Provider    Answer:  Chipper Herb [[id number]]  . glipiZIDE (GLIPIZIDE XL) 10 MG 24 hr tablet    Sig: TAKE 1 TABLET BY MOUTH EVERY DAY    Dispense:  90 tablet    Refill:  1    Order Specific Question:  Supervising Provider    Answer:  Chipper Herb [[id number]]  . lisinopril (PRINIVIL,ZESTRIL) 20 MG tablet    Sig: TAKE 1 TABLET BY MOUTH EVERY DAY    Dispense:  90 tablet    Refill:  1    Order Specific Question:  Supervising Provider    Answer:  Chipper Herb [[id number]]  . sitaGLIPtin-metformin (JANUMET) 50-1000 MG per tablet    Sig: TAKE 1 TABLET BY MOUTH TWICE A DAY    Dispense:  60 tablet    Refill:  5    Order Specific Question:   Supervising Provider    Answer:  Chipper Herb [[id number]]  . rosuvastatin (CRESTOR) 20 MG tablet    Sig: Take 0.5 tablets (10 mg total) by mouth daily.    Dispense:  90 tablet    Refill:  1    Order Specific Question:  Supervising Provider    Answer:  Chipper Herb [[id number]]  Stopped metformin and out on janumet BID Strict carb counting Labs pending Health maintenance reviewed Diet and exercise encouraged Continue all meds Follow up  In 3 months   [place], FNP

## 2014-03-11 NOTE — Patient Instructions (Signed)

## 2014-03-11 NOTE — Addendum Note (Signed)
Addended by: Pollyann Kennedy F on: 03/11/2014 04:57 PM   Modules accepted: Orders

## 2014-03-12 LAB — CMP14+EGFR
A/G RATIO: 2 (ref 1.1–2.5)
ALBUMIN: 4.3 g/dL (ref 3.6–4.8)
ALK PHOS: 64 IU/L (ref 39–117)
ALT: 16 IU/L (ref 0–32)
AST: 15 IU/L (ref 0–40)
BILIRUBIN TOTAL: 0.3 mg/dL (ref 0.0–1.2)
BUN / CREAT RATIO: 10 — AB (ref 11–26)
BUN: 8 mg/dL (ref 8–27)
CO2: 22 mmol/L (ref 18–29)
Calcium: 9.4 mg/dL (ref 8.7–10.3)
Chloride: 102 mmol/L (ref 97–108)
Creatinine, Ser: 0.79 mg/dL (ref 0.57–1.00)
GFR, EST AFRICAN AMERICAN: 93 mL/min/{1.73_m2} (ref >59)
GFR, EST NON AFRICAN AMERICAN: 81 mL/min/{1.73_m2} (ref >59)
GLUCOSE: 207 mg/dL — AB (ref 65–99)
Globulin, Total: 2.2 g/dL (ref 1.5–4.5)
POTASSIUM: 5.3 mmol/L — AB (ref 3.5–5.2)
Sodium: 140 mmol/L (ref 134–144)
TOTAL PROTEIN: 6.5 g/dL (ref 6.0–8.5)

## 2014-03-12 LAB — NMR, LIPOPROFILE
CHOLESTEROL: 115 mg/dL (ref <200)
HDL Cholesterol by NMR: 46 mg/dL (ref >=40)
HDL Particle Number: 35.1 umol/L (ref >=30.5)
LDL Particle Number: 742 nmol/L (ref <1000)
LDL SIZE: 20.8 nm (ref >20.5)
LDLC SERPL CALC-MCNC: 41 mg/dL (ref <100)
LP-IR SCORE: 66 — AB (ref <=45)
SMALL LDL PARTICLE NUMBER: 425 nmol/L (ref <=527)
TRIGLYCERIDES BY NMR: 139 mg/dL (ref <150)

## 2014-03-12 LAB — MICROALBUMIN, URINE: MICROALBUM., U, RANDOM: 26.3 ug/mL — AB (ref 0.0–17.0)

## 2014-03-14 ENCOUNTER — Telehealth: Payer: Self-pay

## 2014-03-14 NOTE — Telephone Encounter (Signed)
She left message on [facility] Voice mail so I returned her call and she needed to ask someone about her visit with Cardiologist for irregular heart beat.  She needs someone to return her call.

## 2014-03-14 NOTE — Telephone Encounter (Signed)
Patient was told in August that she didn't need cardio f/u for 2 years and that all tests were basically normal. She didn't recall discussing arrythmia with anyone.  This has greatly improved since d/c caffeine. She feels a flutter infrequently. She will f/u if it worsens. Note on cardiac event monitor stated "no significant arrythmia." Explained that infrequent arrhythmias are common. She will f/u if necessary.

## 2014-04-05 ENCOUNTER — Telehealth: Payer: Self-pay | Admitting: Nurse Practitioner

## 2014-04-05 DIAGNOSIS — E119 Type 2 diabetes mellitus without complications: Secondary | ICD-10-CM

## 2014-04-05 MED ORDER — SITAGLIPTIN PHOS-METFORMIN HCL 50-1000 MG PO TABS: ORAL_TABLET | ORAL | Status: DC

## 2014-04-05 NOTE — Telephone Encounter (Signed)
done

## 2014-05-16 ENCOUNTER — Other Ambulatory Visit: Payer: Self-pay | Admitting: Nurse Practitioner

## 2014-06-14 ENCOUNTER — Ambulatory Visit (INDEPENDENT_AMBULATORY_CARE_PROVIDER_SITE_OTHER): Payer: BC Managed Care – PPO

## 2014-06-14 ENCOUNTER — Ambulatory Visit (INDEPENDENT_AMBULATORY_CARE_PROVIDER_SITE_OTHER): Payer: BC Managed Care – PPO | Admitting: Nurse Practitioner

## 2014-06-14 ENCOUNTER — Encounter: Payer: Self-pay | Admitting: Nurse Practitioner

## 2014-06-14 VITALS — BP 141/91 | HR 62 | Temp 97.4°F | Ht 67.0 in | Wt 218.0 lb

## 2014-06-14 DIAGNOSIS — E785 Hyperlipidemia, unspecified: Secondary | ICD-10-CM

## 2014-06-14 DIAGNOSIS — F411 Generalized anxiety disorder: Secondary | ICD-10-CM | POA: Insufficient documentation

## 2014-06-14 DIAGNOSIS — Z0000 Encounter for general adult medical examination without abnormal findings: Secondary | ICD-10-CM

## 2014-06-14 DIAGNOSIS — Z87891 Personal history of nicotine dependence: Secondary | ICD-10-CM

## 2014-06-14 DIAGNOSIS — E119 Type 2 diabetes mellitus without complications: Secondary | ICD-10-CM

## 2014-06-14 DIAGNOSIS — I10 Essential (primary) hypertension: Secondary | ICD-10-CM

## 2014-06-14 DIAGNOSIS — Z713 Dietary counseling and surveillance: Secondary | ICD-10-CM

## 2014-06-14 DIAGNOSIS — Z6834 Body mass index (BMI) 34.0-34.9, adult: Secondary | ICD-10-CM

## 2014-06-14 LAB — POCT URINALYSIS DIPSTICK
BILIRUBIN UA: NEGATIVE
Blood, UA: NEGATIVE
GLUCOSE UA: NEGATIVE
KETONES UA: NEGATIVE
Leukocytes, UA: NEGATIVE
Nitrite, UA: NEGATIVE
PH UA: 5
Protein, UA: NEGATIVE
Spec Grav, UA: 1.01
Urobilinogen, UA: NEGATIVE

## 2014-06-14 LAB — POCT UA - MICROSCOPIC ONLY
Casts, Ur, LPF, POC: NEGATIVE
Crystals, Ur, HPF, POC: NEGATIVE
Mucus, UA: NEGATIVE
Yeast, UA: NEGATIVE

## 2014-06-14 LAB — POCT GLYCOSYLATED HEMOGLOBIN (HGB A1C)

## 2014-06-14 MED ORDER — BUSPIRONE HCL 15 MG PO TABS: ORAL_TABLET | ORAL | Status: DC

## 2014-06-14 MED ORDER — SITAGLIPTIN PHOS-METFORMIN HCL 50-1000 MG PO TABS: ORAL_TABLET | ORAL | Status: DC

## 2014-06-14 MED ORDER — ROSUVASTATIN CALCIUM 20 MG PO TABS: 10.0000 mg | ORAL_TABLET | Freq: Every day | ORAL | Status: DC

## 2014-06-14 MED ORDER — GLIPIZIDE ER 10 MG PO TB24: ORAL_TABLET | ORAL | Status: DC

## 2014-06-14 MED ORDER — LISINOPRIL 20 MG PO TABS: ORAL_TABLET | ORAL | Status: DC

## 2014-06-14 NOTE — Progress Notes (Signed)
Subjective:    Patient ID: Erica Evans, female    DOB: Oct 11, 1951, 63 y.o.   MRN: [id number]  Patient here today for Annual Physical exam without pap.  Hypertension This is a chronic problem. The current episode started more than 1 year ago. The problem is unchanged. The problem is uncontrolled. Pertinent negatives include no blurred vision, chest pain, headaches, peripheral edema, shortness of breath or sweats. There are no associated agents to hypertension. Risk factors for coronary artery disease include diabetes mellitus, dyslipidemia, obesity, post-menopausal state and sedentary lifestyle. Past treatments include ACE inhibitors. The current treatment provides moderate improvement. Compliance problems include diet and exercise.   Hyperlipidemia This is a chronic problem. The current episode started more than 1 year ago. The problem is uncontrolled. Recent lipid tests were reviewed and are high. Exacerbating diseases include diabetes and obesity. There are no known factors aggravating her hyperlipidemia. Pertinent negatives include no chest pain or shortness of breath. Current antihyperlipidemic treatment includes statins. The current treatment provides moderate improvement of lipids. Compliance problems include adherence to diet and adherence to exercise.  Risk factors for coronary artery disease include hypertension, diabetes mellitus, obesity and post-menopausal.  Diabetes She presents for her follow-up diabetic visit. She has type 2 diabetes mellitus. No MedicAlert identification noted. The initial diagnosis of diabetes was made 10 years ago. There are no hypoglycemic associated symptoms. Pertinent negatives for hypoglycemia include no headaches or sweats. Pertinent negatives for diabetes include no blurred vision, no chest pain, no polydipsia, no polyphagia, no polyuria, no weakness and no weight loss. There are no hypoglycemic complications. Symptoms are stable. There are no diabetic  complications. Risk factors for coronary artery disease include dyslipidemia, hypertension, obesity, post-menopausal and sedentary lifestyle. Current diabetic treatment includes oral agent (triple therapy). She is compliant with treatment all of the time. Her weight is stable. She is following a generally healthy diet. When asked about meal planning, she reported none. She has not had a previous visit with a dietician. She rarely participates in exercise. Her breakfast blood glucose is taken between 8-9 am. Her breakfast blood glucose range is generally 180-200 mg/dl. Her highest blood glucose is 180-200 mg/dl. Her overall blood glucose range is 180-200 mg/dl. An ACE inhibitor/angiotensin II receptor blocker is being taken. She does not see a podiatrist.Eye exam is current (over a year ago- patient encouraged to make appointment.).      Review of Systems  Constitutional: Negative for weight loss.  Eyes: Negative for blurred vision.  Respiratory: Negative for shortness of breath.   Cardiovascular: Negative for chest pain.  Endocrine: Negative for polydipsia, polyphagia and polyuria.  Neurological: Negative for weakness and headaches.  All other systems reviewed and are negative.      Objective:   Physical Exam  Constitutional: She is oriented to person, place, and time. She appears well-developed and well-nourished.  HENT:  Nose: Nose normal.  Mouth/Throat: Oropharynx is clear and moist.  Eyes: EOM are normal.  Neck: Trachea normal, normal range of motion and full passive range of motion without pain. Neck supple. No JVD present. Carotid bruit is not present. No thyromegaly present.  Cardiovascular: Normal rate, normal heart sounds and intact distal pulses.  Exam reveals no gallop and no friction rub.   No murmur heard. Regular irregularity  Pulmonary/Chest: Effort normal and breath sounds normal.  Abdominal: Soft. Bowel sounds are normal. She exhibits no distension and no mass. There is  no tenderness.  Musculoskeletal: Normal range of motion.  Lymphadenopathy:  She has no cervical adenopathy.  Neurological: She is alert and oriented to person, place, and time. She has normal reflexes.  Skin: Skin is warm and dry.  Psychiatric: She has a normal mood and affect. Her behavior is normal. Judgment and thought content normal.   BP 141/91  Pulse 62  Temp(Src) 97.4 F (36.3 C) (Oral)  Ht _0  (1.702 m)  Wt 218 lb (98.884 kg)  BMI 34.14 kg/m2   Results for orders placed in visit on 06/14/14  POCT URINALYSIS DIPSTICK      Result Value Ref Range   Color, UA gold     Clarity, UA clear     Glucose, UA negative     Bilirubin, UA negative     Ketones, UA negative     Spec Grav, UA 1.010     Blood, UA negative     pH, UA 5.0     Protein, UA negative     Urobilinogen, UA negative     Nitrite, UA negative     Leukocytes, UA Negative    POCT UA - MICROSCOPIC ONLY      Result Value Ref Range   WBC, Ur, HPF, POC 1-5     RBC, urine, microscopic rare     Bacteria, U Microscopic occ     Mucus, UA negative     Epithelial cells, urine per micros occ     Crystals, Ur, HPF, POC negative     Casts, Ur, LPF, POC negative     Yeast, UA negative    POCT GLYCOSYLATED HEMOGLOBIN (HGB A1C)      Result Value Ref Range   Hemoglobin A1C 6.2%            Assessment & Plan:.mmmpt   1. Annual physical exam   2. Type 2 diabetes mellitus without complication   3. Hyperlipidemia   4. Essential hypertension   5. GAD (generalized anxiety disorder)   6. Hx of smoking   7. BMI 34.0-34.9,adult   8. Weight loss counseling, encounter for     Orders Placed This Encounter  Procedures  . DG Chest 2 View    Standing Status: Future     Number of Occurrences:      Standing Expiration Date: 08/14/2015    Order Specific Question:  Reason for Exam (SYMPTOM  OR DIAGNOSIS REQUIRED)    Answer:  screening    Order Specific Question:  Preferred imaging location?    Answer:  Internal  .  CMP14+EGFR  . NMR, lipoprofile  . POCT urinalysis dipstick  . POCT UA - Microscopic Only  . POCT glycosylated hemoglobin (Hb A1C)   Meds ordered this encounter  Medications  . glipiZIDE (GLIPIZIDE XL) 10 MG 24 hr tablet    Sig: TAKE 1 TABLET BY MOUTH EVERY DAY    Dispense:  90 tablet    Refill:  1    Order Specific Question:  Supervising Provider    Answer:  Chipper Herb [[id number]]  . busPIRone (BUSPAR) 15 MG tablet    Sig: Take 1/2 to 1 tablet every 8 hours for nerves    Dispense:  180 tablet    Refill:  1    Order Specific Question:  Supervising Provider    Answer:  Chipper Herb [[id number]]  . rosuvastatin (CRESTOR) 20 MG tablet    Sig: Take 0.5 tablets (10 mg total) by mouth daily.    Dispense:  90 tablet    Refill:  1  Order Specific Question:  Supervising Provider    Answer:  Chipper Herb [[id number]]  . lisinopril (PRINIVIL,ZESTRIL) 20 MG tablet    Sig: TAKE 1 TABLET BY MOUTH EVERY DAY    Dispense:  90 tablet    Refill:  1    Order Specific Question:  Supervising Provider    Answer:  Chipper Herb [[id number]]  . sitaGLIPtin-metformin (JANUMET) 50-1000 MG per tablet    Sig: TAKE 1 TABLET BY MOUTH TWICE A DAY    Dispense:  60 tablet    Refill:  5    Order Specific Question:  Supervising Provider    Answer:  Chipper Herb [[id number]]    Labs pending Health maintenance reviewed Diet and exercise encouraged Continue all meds Follow up  In 3 months   [place], FNP

## 2014-06-14 NOTE — Patient Instructions (Signed)
Diabetes and Foot Care Diabetes may cause you to have problems because of poor blood supply (circulation) to your feet and legs. This may cause the skin on your feet to become thinner, break easier, and heal more slowly. Your skin may become dry, and the skin may peel and crack. You may also have nerve damage in your legs and feet causing decreased feeling in them. You may not notice minor injuries to your feet that could lead to infections or more serious problems. Taking care of your feet is one of the most important things you can do for yourself.  HOME CARE INSTRUCTIONS  Wear shoes at all times, even in the house. Do not go barefoot. Bare feet are easily injured.  Check your feet daily for blisters, cuts, and redness. If you cannot see the bottom of your feet, use a mirror or ask someone for help.  Wash your feet with warm water (do not use hot water) and mild soap. Then pat your feet and the areas between your toes until they are completely dry. Do not soak your feet as this can dry your skin.  Apply a moisturizing lotion or petroleum jelly (that does not contain alcohol and is unscented) to the skin on your feet and to dry, brittle toenails. Do not apply lotion between your toes.  Trim your toenails straight across. Do not dig under them or around the cuticle. File the edges of your nails with an emery board or nail file.  Do not cut corns or calluses or try to remove them with medicine.  Wear clean socks or stockings every day. Make sure they are not too tight. Do not wear knee-high stockings since they may decrease blood flow to your legs.  Wear shoes that fit properly and have enough cushioning. To break in new shoes, wear them for just a few hours a day. This prevents you from injuring your feet. Always look in your shoes before you put them on to be sure there are no objects inside.  Do not cross your legs. This may decrease the blood flow to your feet.  If you find a minor scrape,  cut, or break in the skin on your feet, keep it and the skin around it clean and dry. These areas may be cleansed with mild soap and water. Do not cleanse the area with peroxide, alcohol, or iodine.  When you remove an adhesive bandage, be sure not to damage the skin around it.  If you have a wound, look at it several times a day to make sure it is healing.  Do not use heating pads or hot water bottles. They may burn your skin. If you have lost feeling in your feet or legs, you may not know it is happening until it is too late.  Make sure your health care provider performs a complete foot exam at least annually or more often if you have foot problems. Report any cuts, sores, or bruises to your health care provider immediately. SEEK MEDICAL CARE IF:   You have an injury that is not healing.  You have cuts or breaks in the skin.  You have an ingrown nail.  You notice redness on your legs or feet.  You feel burning or tingling in your legs or feet.  You have pain or cramps in your legs and feet.  Your legs or feet are numb.  Your feet always feel cold. SEEK IMMEDIATE MEDICAL CARE IF:   There is increasing redness,   swelling, or pain in or around a wound.  There is a red line that goes up your leg.  Pus is coming from a wound.  You develop a fever or as directed by your health care provider.  You notice a bad smell coming from an ulcer or wound. Document Released: 10/29/2000 Document Revised: 07/04/2013 Document Reviewed: 04/10/2013 Exit[employer] Patient Information 2015 [employer]. This information is not intended to replace advice given to you by your health care provider. Make sure you discuss any questions you have with your health care provider.  

## 2014-06-15 LAB — CMP14+EGFR
A/G RATIO: 1.8 (ref 1.1–2.5)
ALK PHOS: 56 IU/L (ref 39–117)
ALT: 20 IU/L (ref 0–32)
AST: 20 IU/L (ref 0–40)
Albumin: 4.6 g/dL (ref 3.6–4.8)
BUN / CREAT RATIO: 13 (ref 11–26)
BUN: 12 mg/dL (ref 8–27)
CO2: 21 mmol/L (ref 18–29)
CREATININE: 0.94 mg/dL (ref 0.57–1.00)
Calcium: 9.9 mg/dL (ref 8.7–10.3)
Chloride: 97 mmol/L (ref 97–108)
GFR calc Af Amer: 76 mL/min/{1.73_m2} (ref >59)
GFR, EST NON AFRICAN AMERICAN: 66 mL/min/{1.73_m2} (ref >59)
GLOBULIN, TOTAL: 2.5 g/dL (ref 1.5–4.5)
Glucose: 122 mg/dL — ABNORMAL HIGH (ref 65–99)
Potassium: 5.4 mmol/L — ABNORMAL HIGH (ref 3.5–5.2)
Sodium: 136 mmol/L (ref 134–144)
Total Bilirubin: 0.3 mg/dL (ref 0.0–1.2)
Total Protein: 7.1 g/dL (ref 6.0–8.5)

## 2014-06-15 LAB — NMR, LIPOPROFILE
CHOLESTEROL: 179 mg/dL (ref 100–199)
HDL Cholesterol by NMR: 42 mg/dL (ref >39)
HDL PARTICLE NUMBER: 29.9 umol/L — AB (ref >=30.5)
LDL Particle Number: 1239 nmol/L — ABNORMAL HIGH (ref <1000)
LDL Size: 21.3 nm (ref >20.5)
LDLC SERPL CALC-MCNC: 99 mg/dL (ref 0–99)
LP-IR SCORE: 69 — AB (ref <=45)
SMALL LDL PARTICLE NUMBER: 471 nmol/L (ref <=527)
Triglycerides by NMR: 191 mg/dL — ABNORMAL HIGH (ref 0–149)

## 2014-06-16 ENCOUNTER — Other Ambulatory Visit: Payer: Self-pay | Admitting: Nurse Practitioner

## 2014-09-19 ENCOUNTER — Encounter: Payer: Self-pay | Admitting: Nurse Practitioner

## 2014-09-19 ENCOUNTER — Ambulatory Visit (INDEPENDENT_AMBULATORY_CARE_PROVIDER_SITE_OTHER): Payer: BC Managed Care – PPO | Admitting: Nurse Practitioner

## 2014-09-19 VITALS — BP 137/76 | HR 60 | Temp 97.3°F | Ht 67.0 in | Wt 207.8 lb

## 2014-09-19 DIAGNOSIS — I10 Essential (primary) hypertension: Secondary | ICD-10-CM

## 2014-09-19 DIAGNOSIS — F411 Generalized anxiety disorder: Secondary | ICD-10-CM

## 2014-09-19 DIAGNOSIS — Z6834 Body mass index (BMI) 34.0-34.9, adult: Secondary | ICD-10-CM

## 2014-09-19 DIAGNOSIS — E119 Type 2 diabetes mellitus without complications: Secondary | ICD-10-CM

## 2014-09-19 DIAGNOSIS — Z23 Encounter for immunization: Secondary | ICD-10-CM

## 2014-09-19 DIAGNOSIS — E785 Hyperlipidemia, unspecified: Secondary | ICD-10-CM

## 2014-09-19 LAB — POCT GLYCOSYLATED HEMOGLOBIN (HGB A1C): HEMOGLOBIN A1C: 5.5

## 2014-09-19 NOTE — Patient Instructions (Signed)

## 2014-09-19 NOTE — Progress Notes (Signed)
   Subjective:    Patient ID: Erica Evans, female    DOB: [birth year], 63 y.o.   MRN: [id number]  HPI Patient here today for follow up of chronic medical problems. No compliants today. Hypertension Currently taking lisinopril which keeps blood pressure under control- no c/o side effects- trying to  diet and  exercise. Hyperlipidemia Was on crestor but coupon card ran out so she has not taken it in over a month. Has lost a little weight. Diabetes Currently on jamumet and glipizide- SHe has cut her janumet dose in half at night because fasting blood sugars were low in the mornings.  GAD buspar keeps her calm without side effects. Vitamin d def OTC vitamin d daily  Review of Systems  Constitutional: Negative.   HENT: Negative.   Respiratory: Negative.   Cardiovascular: Negative.   Gastrointestinal: Negative.   Neurological: Negative.   Psychiatric/Behavioral: Negative.   All other systems reviewed and are negative.      Objective:   Physical Exam  Constitutional: She is oriented to person, place, and time. She appears well-developed and well-nourished.  HENT:  Nose: Nose normal.  Mouth/Throat: Oropharynx is clear and moist.  Eyes: EOM are normal.  Neck: Trachea normal, normal range of motion and full passive range of motion without pain. Neck supple. No JVD present. Carotid bruit is not present. No thyromegaly present.  Cardiovascular: Normal rate, regular rhythm, normal heart sounds and intact distal pulses.  Exam reveals no gallop and no friction rub.   No murmur heard. Pulmonary/Chest: Effort normal and breath sounds normal.  Abdominal: Soft. Bowel sounds are normal. She exhibits no distension and no mass. There is no tenderness.  Musculoskeletal: Normal range of motion.  Lymphadenopathy:    She has no cervical adenopathy.  Neurological: She is alert and oriented to person, place, and time. She has normal reflexes.  Skin: Skin is warm and dry.  Psychiatric: She has a  normal mood and affect. Her behavior is normal. Judgment and thought content normal.    BP 137/76 mmHg  Pulse 60  Temp(Src) 97.3 F (36.3 C) (Oral)  Ht _0  (1.702 m)  Wt 207 lb 12.8 oz (94.257 kg)  BMI 32.54 kg/m2  Results for orders placed or performed in visit on 09/19/14  POCT glycosylated hemoglobin (Hb A1C)  Result Value Ref Range   Hemoglobin A1C 5.5          Assessment & Plan:  1. Type 2 diabetes mellitus without complication PAtient told that would rather her cut her glipizide in half rather than decreasing janumet dose -go back on janumet BID Keep blood sugar diary - POCT glycosylated hemoglobin (Hb A1C)  2. Hyperlipidemia Low fat diet - NMR, lipoprofile  3. Essential hypertension Low Na+ diet - CMP14+EGFR  4. GAD (generalized anxiety disorder) Stress management  5. BMI 34.0-34.9,adult Discussed diet and exercise for person with BMI >25 Will recheck weight in 3-6 months   Flu shot today Patient encouraged to schedule mammo and PAP Discussed colnoscopy but refuses Labs pending Health maintenance reviewed Diet and exercise encouraged Continue all meds Follow up  In 3 months   [place], FNP

## 2014-09-20 LAB — CMP14+EGFR
ALT: 15 IU/L (ref 0–32)
AST: 20 IU/L (ref 0–40)
Albumin/Globulin Ratio: 1.7 (ref 1.1–2.5)
Albumin: 4.2 g/dL (ref 3.6–4.8)
Alkaline Phosphatase: 62 IU/L (ref 39–117)
BUN/Creatinine Ratio: 13 (ref 11–26)
BUN: 11 mg/dL (ref 8–27)
CALCIUM: 9.9 mg/dL (ref 8.7–10.3)
CO2: 24 mmol/L (ref 18–29)
Chloride: 100 mmol/L (ref 97–108)
Creatinine, Ser: 0.84 mg/dL (ref 0.57–1.00)
GFR calc Af Amer: 86 mL/min/{1.73_m2} (ref >59)
GFR calc non Af Amer: 75 mL/min/{1.73_m2} (ref >59)
GLUCOSE: 112 mg/dL — AB (ref 65–99)
Globulin, Total: 2.5 g/dL (ref 1.5–4.5)
POTASSIUM: 5 mmol/L (ref 3.5–5.2)
Sodium: 140 mmol/L (ref 134–144)
TOTAL PROTEIN: 6.7 g/dL (ref 6.0–8.5)
Total Bilirubin: 0.3 mg/dL (ref 0.0–1.2)

## 2014-09-20 LAB — NMR, LIPOPROFILE
Cholesterol: 201 mg/dL — ABNORMAL HIGH (ref 100–199)
HDL CHOLESTEROL BY NMR: 50 mg/dL (ref >39)
HDL Particle Number: 29.1 umol/L — ABNORMAL LOW (ref >=30.5)
LDL Particle Number: 1288 nmol/L — ABNORMAL HIGH (ref <1000)
LDL Size: 21.6 nm (ref >20.5)
LDL-C: 115 mg/dL — ABNORMAL HIGH (ref 0–99)
LP-IR Score: 54 — ABNORMAL HIGH (ref <=45)
Small LDL Particle Number: 177 nmol/L (ref <=527)
TRIGLYCERIDES BY NMR: 178 mg/dL — AB (ref 0–149)

## 2014-10-09 ENCOUNTER — Other Ambulatory Visit: Payer: Self-pay | Admitting: Nurse Practitioner

## 2014-12-15 ENCOUNTER — Other Ambulatory Visit: Payer: Self-pay | Admitting: Nurse Practitioner

## 2014-12-23 ENCOUNTER — Ambulatory Visit: Payer: BC Managed Care – PPO | Admitting: Nurse Practitioner

## 2015-02-28 ENCOUNTER — Other Ambulatory Visit: Payer: Self-pay | Admitting: Nurse Practitioner

## 2015-03-21 ENCOUNTER — Encounter: Payer: Self-pay | Admitting: Nurse Practitioner

## 2015-04-27 ENCOUNTER — Other Ambulatory Visit: Payer: Self-pay | Admitting: Nurse Practitioner

## 2015-05-25 ENCOUNTER — Other Ambulatory Visit: Payer: Self-pay | Admitting: Nurse Practitioner

## 2015-05-26 NOTE — Telephone Encounter (Signed)
Last seen 09/20/15 MMM  Requesting 90 day supply  No upcoming appt scheduled

## 2015-05-26 NOTE — Telephone Encounter (Signed)
No 90 day supply- only 30 - Patient NTBS for follow up and lab work

## 2015-05-27 NOTE — Telephone Encounter (Signed)
lmovm that pt will need an appt & labs before next refill

## 2015-06-02 ENCOUNTER — Other Ambulatory Visit: Payer: Self-pay | Admitting: Nurse Practitioner

## 2015-06-02 NOTE — Telephone Encounter (Signed)
Last seen 09/19/14 MMM

## 2015-06-02 NOTE — Telephone Encounter (Signed)
no more refills without being seen  

## 2015-06-04 ENCOUNTER — Other Ambulatory Visit: Payer: Self-pay | Admitting: Nurse Practitioner

## 2015-06-05 NOTE — Telephone Encounter (Signed)
Please call pahrmacy and ask them why co pay went up or what does insurance prefer DO we have coupons

## 2015-06-05 NOTE — Telephone Encounter (Signed)
We had a $5 co pay coupon card - pt aware to come and pick up

## 2015-06-23 ENCOUNTER — Other Ambulatory Visit: Payer: Self-pay | Admitting: Nurse Practitioner

## 2015-06-26 ENCOUNTER — Ambulatory Visit (INDEPENDENT_AMBULATORY_CARE_PROVIDER_SITE_OTHER): Payer: BLUE CROSS/BLUE SHIELD | Admitting: Family

## 2015-06-26 ENCOUNTER — Encounter: Payer: Self-pay | Admitting: Family

## 2015-06-26 ENCOUNTER — Encounter (INDEPENDENT_AMBULATORY_CARE_PROVIDER_SITE_OTHER): Payer: Self-pay

## 2015-06-26 VITALS — BP 155/77 | HR 75 | Temp 98.1°F | Ht 67.0 in | Wt 223.2 lb

## 2015-06-26 DIAGNOSIS — E119 Type 2 diabetes mellitus without complications: Secondary | ICD-10-CM | POA: Diagnosis not present

## 2015-06-26 DIAGNOSIS — I10 Essential (primary) hypertension: Secondary | ICD-10-CM | POA: Diagnosis not present

## 2015-06-26 DIAGNOSIS — Z6834 Body mass index (BMI) 34.0-34.9, adult: Secondary | ICD-10-CM | POA: Diagnosis not present

## 2015-06-26 DIAGNOSIS — F411 Generalized anxiety disorder: Secondary | ICD-10-CM

## 2015-06-26 DIAGNOSIS — Z0000 Encounter for general adult medical examination without abnormal findings: Secondary | ICD-10-CM | POA: Diagnosis not present

## 2015-06-26 LAB — POCT UA - MICROALBUMIN: Microalbumin Ur, POC: 20 mg/L

## 2015-06-26 LAB — POCT GLYCOSYLATED HEMOGLOBIN (HGB A1C): HEMOGLOBIN A1C: 6.3

## 2015-06-26 MED ORDER — BUSPIRONE HCL 15 MG PO TABS: ORAL_TABLET | ORAL | Status: DC

## 2015-06-26 MED ORDER — GLIPIZIDE ER 10 MG PO TB24: 10.0000 mg | ORAL_TABLET | Freq: Every day | ORAL | Status: DC

## 2015-06-26 MED ORDER — LISINOPRIL 20 MG PO TABS: 20.0000 mg | ORAL_TABLET | Freq: Every day | ORAL | Status: DC

## 2015-06-26 MED ORDER — SITAGLIPTIN PHOS-METFORMIN HCL 50-1000 MG PO TABS: 1.0000 | ORAL_TABLET | Freq: Two times a day (BID) | ORAL | Status: DC

## 2015-06-26 NOTE — Patient Instructions (Signed)

## 2015-06-26 NOTE — Progress Notes (Signed)
Subjective:    Patient ID: Erica Evans, female    DOB: December 13, 1950, 64 y.o.   MRN: [id number]  Pt presents to the office today for CPE without pap.  Diabetes She presents for her follow-up diabetic visit. She has type 2 diabetes mellitus. No MedicAlert identification noted. The initial diagnosis of diabetes was made 10 years ago. There are no hypoglycemic associated symptoms. Pertinent negatives for hypoglycemia include no headaches or sweats. Pertinent negatives for diabetes include no blurred vision, no polydipsia, no polyphagia, no polyuria, no weakness and no weight loss. There are no hypoglycemic complications. Symptoms are stable. There are no diabetic complications. Pertinent negatives for diabetic complications include no CVA. Risk factors for coronary artery disease include dyslipidemia, hypertension, obesity, post-menopausal and sedentary lifestyle. Current diabetic treatment includes oral agent (triple therapy). She is compliant with treatment all of the time. Her weight is stable. She is following a generally healthy diet. When asked about meal planning, she reported none. She has not had a previous visit with a dietitian. She rarely participates in exercise. Her breakfast blood glucose is taken between 8-9 am. Her breakfast blood glucose range is generally 110-130 mg/dl. An ACE inhibitor/angiotensin II receptor blocker is being taken. She does not see a podiatrist.Eye exam is current.  Hypertension This is a chronic problem. The current episode started more than 1 year ago. The problem has been waxing and waning since onset. The problem is uncontrolled. Pertinent negatives include no blurred vision, headaches, palpitations, peripheral edema, shortness of breath or sweats. Risk factors for coronary artery disease include obesity, post-menopausal state, sedentary lifestyle and family history. Past treatments include ACE inhibitors. The current treatment provides moderate improvement. There is  no history of kidney disease, CAD/MI, CVA, heart failure or a thyroid problem. There is no history of sleep apnea.      Review of Systems  Constitutional: Negative.  Negative for weight loss.  HENT: Negative.   Eyes: Negative.  Negative for blurred vision.  Respiratory: Negative.  Negative for shortness of breath.   Cardiovascular: Negative.  Negative for palpitations.  Gastrointestinal: Negative.   Endocrine: Negative.  Negative for polydipsia, polyphagia and polyuria.  Genitourinary: Negative.   Musculoskeletal: Negative.   Neurological: Negative.  Negative for weakness and headaches.  Hematological: Negative.   Psychiatric/Behavioral: Negative.   All other systems reviewed and are negative.      Objective:   Physical Exam  Constitutional: She is oriented to person, place, and time. She appears well-developed and well-nourished. No distress.  HENT:  Head: Normocephalic and atraumatic.  Right Ear: External ear normal.  Left Ear: External ear normal.  Nose: Nose normal.  Mouth/Throat: Oropharynx is clear and moist.  Eyes: Pupils are equal, round, and reactive to light.  Neck: Normal range of motion. Neck supple. No thyromegaly present.  Cardiovascular: Normal rate, regular rhythm, normal heart sounds and intact distal pulses.   No murmur heard. Pulmonary/Chest: Effort normal and breath sounds normal. No respiratory distress. She has no wheezes.  Abdominal: Soft. Bowel sounds are normal. She exhibits no distension. There is no tenderness.  Musculoskeletal: Normal range of motion. She exhibits no edema or tenderness.  Neurological: She is alert and oriented to person, place, and time. She has normal reflexes. No cranial nerve deficit.  Skin: Skin is warm and dry.  Psychiatric: She has a normal mood and affect. Her behavior is normal. Judgment and thought content normal.  Vitals reviewed.   BP 155/77 mmHg  Pulse 75  Temp(Src) 98.1  F (36.7 C) (Oral)  Ht '5\' 7"'  (1.702 m)   Wt 223 lb 3.2 oz (101.243 kg)  BMI 34.95 kg/m2       Assessment & Plan:  1. Essential hypertension  -Pt to continue monitoring BP at home- Pt states home reading is 130's/70's -Pt states she is having a lot stress related to her husband recent surgery -Dash diet information given -Exercise encouraged - Stress Management  -Continue current meds -RTO in 3 months - CMP14+EGFR - lisinopril (PRINIVIL,ZESTRIL) 20 MG tablet; Take 1 tablet (20 mg total) by mouth daily.  Dispense: 90 tablet; Refill: 3  2. Type 2 diabetes mellitus without complication - POCT glycosylated hemoglobin (Hb A1C) - POCT UA - Microalbumin - CMP14+EGFR - sitaGLIPtin-metformin (JANUMET) 50-1000 MG per tablet; Take 1 tablet by mouth 2 (two) times daily.  Dispense: 180 tablet; Refill: 1 - glipiZIDE (GLUCOTROL XL) 10 MG 24 hr tablet; Take 1 tablet (10 mg total) by mouth daily.  Dispense: 90 tablet; Refill: 2  3. GAD (generalized anxiety disorder) -Stress management- Pt states she is going through a lot of stress with her husband having recent surgery - CMP14+EGFR - busPIRone (BUSPAR) 15 MG tablet; Take 1/2 to 1 tablet every 8 hours for nerves  Dispense: 180 tablet; Refill: 1  4. BMI 34.0-34.9,adult - CMP14+EGFR  5. Annual physical exam - POCT glycosylated hemoglobin (Hb A1C) - POCT UA - Microalbumin - CMP14+EGFR - Lipid panel - Thyroid Panel With TSH - Vit D  25 hydroxy (rtn osteoporosis monitoring) - CBC with Differential   Continue all meds Labs pending Health Maintenance reviewed Diet and exercise encouraged RTO [place] [facility], FNP

## 2015-06-26 NOTE — Addendum Note (Signed)
Addended by: Selmer Dominion on: 06/26/2015 10:57 AM   Modules accepted: Orders

## 2015-06-27 ENCOUNTER — Telehealth: Payer: Self-pay | Admitting: Family

## 2015-06-27 ENCOUNTER — Other Ambulatory Visit: Payer: Self-pay | Admitting: Family

## 2015-06-27 DIAGNOSIS — E559 Vitamin D deficiency, unspecified: Secondary | ICD-10-CM | POA: Insufficient documentation

## 2015-06-27 DIAGNOSIS — E875 Hyperkalemia: Secondary | ICD-10-CM

## 2015-06-27 LAB — THYROID PANEL WITH TSH
FREE THYROXINE INDEX: 1.9 (ref 1.2–4.9)
T3 Uptake Ratio: 27 % (ref 24–39)
T4, Total: 7.2 ug/dL (ref 4.5–12.0)
TSH: 2.89 u[IU]/mL (ref 0.450–4.500)

## 2015-06-27 LAB — CMP14+EGFR
A/G RATIO: 1.6 (ref 1.1–2.5)
ALBUMIN: 4.3 g/dL (ref 3.6–4.8)
ALT: 15 IU/L (ref 0–32)
AST: 18 IU/L (ref 0–40)
Alkaline Phosphatase: 71 IU/L (ref 39–117)
BILIRUBIN TOTAL: 0.2 mg/dL (ref 0.0–1.2)
BUN/Creatinine Ratio: 12 (ref 11–26)
BUN: 10 mg/dL (ref 8–27)
CO2: 27 mmol/L (ref 18–29)
Calcium: 9.7 mg/dL (ref 8.7–10.3)
Chloride: 96 mmol/L — ABNORMAL LOW (ref 97–108)
Creatinine, Ser: 0.84 mg/dL (ref 0.57–1.00)
GFR calc non Af Amer: 75 mL/min/{1.73_m2} (ref >59)
GFR, EST AFRICAN AMERICAN: 86 mL/min/{1.73_m2} (ref >59)
Globulin, Total: 2.7 g/dL (ref 1.5–4.5)
Glucose: 97 mg/dL (ref 65–99)
Potassium: 5.8 mmol/L — ABNORMAL HIGH (ref 3.5–5.2)
Sodium: 139 mmol/L (ref 134–144)
TOTAL PROTEIN: 7 g/dL (ref 6.0–8.5)

## 2015-06-27 LAB — CBC WITH DIFFERENTIAL/PLATELET
BASOS ABS: 0 10*3/uL (ref 0.0–0.2)
Basos: 1 %
EOS (ABSOLUTE): 0.2 10*3/uL (ref 0.0–0.4)
Eos: 3 %
HEMATOCRIT: 40.8 % (ref 34.0–46.6)
Hemoglobin: 13.7 g/dL (ref 11.1–15.9)
Immature Grans (Abs): 0 10*3/uL (ref 0.0–0.1)
Immature Granulocytes: 0 %
Lymphocytes Absolute: 2.8 10*3/uL (ref 0.7–3.1)
Lymphs: 36 %
MCH: 26.9 pg (ref 26.6–33.0)
MCHC: 33.6 g/dL (ref 31.5–35.7)
MCV: 80 fL (ref 79–97)
MONOCYTES: 6 %
Monocytes Absolute: 0.5 10*3/uL (ref 0.1–0.9)
Neutrophils Absolute: 4.3 10*3/uL (ref 1.4–7.0)
Neutrophils: 54 %
Platelets: 437 10*3/uL — ABNORMAL HIGH (ref 150–379)
RBC: 5.1 x10E6/uL (ref 3.77–5.28)
RDW: 14.3 % (ref 12.3–15.4)
WBC: 7.8 10*3/uL (ref 3.4–10.8)

## 2015-06-27 LAB — LIPID PANEL
Chol/HDL Ratio: 3.7 ratio units (ref 0.0–4.4)
Cholesterol, Total: 207 mg/dL — ABNORMAL HIGH (ref 100–199)
HDL: 56 mg/dL (ref >39)
LDL Calculated: 113 mg/dL — ABNORMAL HIGH (ref 0–99)
TRIGLYCERIDES: 188 mg/dL — AB (ref 0–149)
VLDL Cholesterol Cal: 38 mg/dL (ref 5–40)

## 2015-06-27 LAB — VITAMIN D 25 HYDROXY (VIT D DEFICIENCY, FRACTURES): VIT D 25 HYDROXY: 28.1 ng/mL — AB (ref 30.0–100.0)

## 2015-06-27 LAB — MICROALBUMIN, URINE: Microalbumin, Urine: 7.8 ug/mL

## 2015-06-27 MED ORDER — VITAMIN D (ERGOCALCIFEROL) 1.25 MG (50000 UNIT) PO CAPS: 50000.0000 [IU] | ORAL_CAPSULE | ORAL | Status: DC

## 2015-06-28 ENCOUNTER — Other Ambulatory Visit: Payer: BLUE CROSS/BLUE SHIELD

## 2015-06-28 ENCOUNTER — Other Ambulatory Visit: Payer: Self-pay | Admitting: Family

## 2015-06-28 DIAGNOSIS — E875 Hyperkalemia: Secondary | ICD-10-CM

## 2015-06-29 ENCOUNTER — Other Ambulatory Visit: Payer: Self-pay | Admitting: Family

## 2015-06-29 DIAGNOSIS — E559 Vitamin D deficiency, unspecified: Secondary | ICD-10-CM

## 2015-06-29 DIAGNOSIS — E875 Hyperkalemia: Secondary | ICD-10-CM

## 2015-06-29 LAB — BMP8+EGFR
BUN/Creatinine Ratio: 14 (ref 11–26)
BUN: 11 mg/dL (ref 8–27)
CALCIUM: 9.9 mg/dL (ref 8.7–10.3)
CO2: 25 mmol/L (ref 18–29)
Chloride: 96 mmol/L — ABNORMAL LOW (ref 97–108)
Creatinine, Ser: 0.81 mg/dL (ref 0.57–1.00)
GFR calc Af Amer: 90 mL/min/{1.73_m2} (ref >59)
GFR, EST NON AFRICAN AMERICAN: 78 mL/min/{1.73_m2} (ref >59)
Glucose: 124 mg/dL — ABNORMAL HIGH (ref 65–99)
POTASSIUM: 6 mmol/L — AB (ref 3.5–5.2)
Sodium: 139 mmol/L (ref 134–144)

## 2015-06-29 MED ORDER — VITAMIN D (ERGOCALCIFEROL) 1.25 MG (50000 UNIT) PO CAPS: 50000.0000 [IU] | ORAL_CAPSULE | ORAL | Status: DC

## 2015-06-29 MED ORDER — SODIUM POLYSTYRENE SULFONATE 15 GM/60ML PO SUSP: 30.0000 g | Freq: Once | ORAL | Status: DC

## 2015-06-30 ENCOUNTER — Other Ambulatory Visit (INDEPENDENT_AMBULATORY_CARE_PROVIDER_SITE_OTHER): Payer: BLUE CROSS/BLUE SHIELD

## 2015-06-30 DIAGNOSIS — E875 Hyperkalemia: Secondary | ICD-10-CM

## 2015-06-30 NOTE — Progress Notes (Signed)
Lab only 

## 2015-07-01 ENCOUNTER — Other Ambulatory Visit: Payer: Self-pay | Admitting: Family

## 2015-07-01 ENCOUNTER — Telehealth: Payer: Self-pay | Admitting: Family

## 2015-07-01 DIAGNOSIS — E875 Hyperkalemia: Secondary | ICD-10-CM

## 2015-07-01 LAB — BMP8+EGFR
BUN/Creatinine Ratio: 14 (ref 11–26)
BUN: 10 mg/dL (ref 8–27)
CHLORIDE: 96 mmol/L — AB (ref 97–108)
CO2: 25 mmol/L (ref 18–29)
CREATININE: 0.74 mg/dL (ref 0.57–1.00)
Calcium: 9.7 mg/dL (ref 8.7–10.3)
GFR calc Af Amer: 100 mL/min/{1.73_m2} (ref >59)
GFR calc non Af Amer: 87 mL/min/{1.73_m2} (ref >59)
GLUCOSE: 137 mg/dL — AB (ref 65–99)
Potassium: 5.5 mmol/L — ABNORMAL HIGH (ref 3.5–5.2)
Sodium: 137 mmol/L (ref 134–144)

## 2015-07-01 NOTE — Telephone Encounter (Signed)
Spoke with patient earlier. She is to take one dose of kayexalate and come to lab tomorrow for repeat potassium.

## 2015-07-02 ENCOUNTER — Other Ambulatory Visit (INDEPENDENT_AMBULATORY_CARE_PROVIDER_SITE_OTHER): Payer: BLUE CROSS/BLUE SHIELD

## 2015-07-02 DIAGNOSIS — E875 Hyperkalemia: Secondary | ICD-10-CM

## 2015-07-02 NOTE — Progress Notes (Signed)
Lab only 

## 2015-07-03 LAB — POTASSIUM: POTASSIUM: 5.1 mmol/L (ref 3.5–5.2)

## 2015-08-04 ENCOUNTER — Other Ambulatory Visit: Payer: Self-pay | Admitting: Nurse Practitioner

## 2015-08-05 ENCOUNTER — Other Ambulatory Visit: Payer: Self-pay | Admitting: Nurse Practitioner

## 2015-08-23 ENCOUNTER — Other Ambulatory Visit: Payer: Self-pay | Admitting: Nurse Practitioner

## 2015-09-01 ENCOUNTER — Telehealth: Payer: Self-pay | Admitting: Family

## 2015-09-02 ENCOUNTER — Encounter: Payer: Self-pay | Admitting: Family

## 2015-09-03 ENCOUNTER — Ambulatory Visit (INDEPENDENT_AMBULATORY_CARE_PROVIDER_SITE_OTHER): Payer: BLUE CROSS/BLUE SHIELD

## 2015-09-03 DIAGNOSIS — Z23 Encounter for immunization: Secondary | ICD-10-CM | POA: Diagnosis not present

## 2015-10-03 ENCOUNTER — Ambulatory Visit: Payer: BLUE CROSS/BLUE SHIELD | Admitting: Family

## 2015-12-09 ENCOUNTER — Other Ambulatory Visit: Payer: Self-pay | Admitting: Family

## 2015-12-16 ENCOUNTER — Telehealth: Payer: Self-pay | Admitting: Family

## 2016-01-15 ENCOUNTER — Other Ambulatory Visit: Payer: Self-pay | Admitting: Family

## 2016-01-15 NOTE — Telephone Encounter (Signed)
Last seen 06/26/15 [facility]

## 2016-01-29 LAB — HM DIABETES EYE EXAM

## 2016-02-12 ENCOUNTER — Ambulatory Visit: Payer: BLUE CROSS/BLUE SHIELD | Admitting: Nurse Practitioner

## 2016-02-24 ENCOUNTER — Telehealth: Payer: Self-pay | Admitting: Family

## 2016-05-24 ENCOUNTER — Telehealth: Payer: Self-pay | Admitting: Family

## 2016-05-27 ENCOUNTER — Other Ambulatory Visit: Payer: Self-pay | Admitting: Family

## 2016-05-28 NOTE — Telephone Encounter (Signed)
Last seen 06/26/15  Erica Evans last Vit D 06/26/15   28.1

## 2016-06-16 ENCOUNTER — Other Ambulatory Visit: Payer: Self-pay | Admitting: Family

## 2016-06-16 DIAGNOSIS — I10 Essential (primary) hypertension: Secondary | ICD-10-CM

## 2016-06-28 ENCOUNTER — Ambulatory Visit (INDEPENDENT_AMBULATORY_CARE_PROVIDER_SITE_OTHER): Payer: BLUE CROSS/BLUE SHIELD | Admitting: Nurse Practitioner

## 2016-06-28 ENCOUNTER — Encounter: Payer: Self-pay | Admitting: Nurse Practitioner

## 2016-06-28 VITALS — BP 133/66 | HR 66 | Temp 97.2°F | Ht 67.0 in | Wt 229.0 lb

## 2016-06-28 DIAGNOSIS — E785 Hyperlipidemia, unspecified: Secondary | ICD-10-CM

## 2016-06-28 DIAGNOSIS — I10 Essential (primary) hypertension: Secondary | ICD-10-CM | POA: Diagnosis not present

## 2016-06-28 DIAGNOSIS — E119 Type 2 diabetes mellitus without complications: Secondary | ICD-10-CM

## 2016-06-28 DIAGNOSIS — Z6834 Body mass index (BMI) 34.0-34.9, adult: Secondary | ICD-10-CM

## 2016-06-28 DIAGNOSIS — F411 Generalized anxiety disorder: Secondary | ICD-10-CM

## 2016-06-28 DIAGNOSIS — E559 Vitamin D deficiency, unspecified: Secondary | ICD-10-CM

## 2016-06-28 LAB — BAYER DCA HB A1C WAIVED: HB A1C: 7.4 % — AB (ref <7.0)

## 2016-06-28 MED ORDER — GLIPIZIDE ER 10 MG PO TB24: 10.0000 mg | ORAL_TABLET | Freq: Every day | ORAL | 2 refills | Status: DC

## 2016-06-28 MED ORDER — SITAGLIPTIN PHOS-METFORMIN HCL 50-1000 MG PO TABS: 1.0000 | ORAL_TABLET | Freq: Two times a day (BID) | ORAL | 1 refills | Status: DC

## 2016-06-28 NOTE — Patient Instructions (Signed)
Diabetes and Foot Care Diabetes may cause you to have problems because of poor blood supply (circulation) to your feet and legs. This may cause the skin on your feet to become thinner, break easier, and heal more slowly. Your skin may become dry, and the skin may peel and crack. You may also have nerve damage in your legs and feet causing decreased feeling in them. You may not notice minor injuries to your feet that could lead to infections or more serious problems. Taking care of your feet is one of the most important things you can do for yourself.  HOME CARE INSTRUCTIONS  Wear shoes at all times, even in the house. Do not go barefoot. Bare feet are easily injured.  Check your feet daily for blisters, cuts, and redness. If you cannot see the bottom of your feet, use a mirror or ask someone for help.  Wash your feet with warm water (do not use hot water) and mild soap. Then pat your feet and the areas between your toes until they are completely dry. Do not soak your feet as this can dry your skin.  Apply a moisturizing lotion or petroleum jelly (that does not contain alcohol and is unscented) to the skin on your feet and to dry, brittle toenails. Do not apply lotion between your toes.  Trim your toenails straight across. Do not dig under them or around the cuticle. File the edges of your nails with an emery board or nail file.  Do not cut corns or calluses or try to remove them with medicine.  Wear clean socks or stockings every day. Make sure they are not too tight. Do not wear knee-high stockings since they may decrease blood flow to your legs.  Wear shoes that fit properly and have enough cushioning. To break in new shoes, wear them for just a few hours a day. This prevents you from injuring your feet. Always look in your shoes before you put them on to be sure there are no objects inside.  Do not cross your legs. This may decrease the blood flow to your feet.  If you find a minor scrape,  cut, or break in the skin on your feet, keep it and the skin around it clean and dry. These areas may be cleansed with mild soap and water. Do not cleanse the area with peroxide, alcohol, or iodine.  When you remove an adhesive bandage, be sure not to damage the skin around it.  If you have a wound, look at it several times a day to make sure it is healing.  Do not use heating pads or hot water bottles. They may burn your skin. If you have lost feeling in your feet or legs, you may not know it is happening until it is too late.  Make sure your health care provider performs a complete foot exam at least annually or more often if you have foot problems. Report any cuts, sores, or bruises to your health care provider immediately. SEEK MEDICAL CARE IF:   You have an injury that is not healing.  You have cuts or breaks in the skin.  You have an ingrown nail.  You notice redness on your legs or feet.  You feel burning or tingling in your legs or feet.  You have pain or cramps in your legs and feet.  Your legs or feet are numb.  Your feet always feel cold. SEEK IMMEDIATE MEDICAL CARE IF:   There is increasing redness,   swelling, or pain in or around a wound.  There is a red line that goes up your leg.  Pus is coming from a wound.  You develop a fever or as directed by your health care provider.  You notice a bad smell coming from an ulcer or wound.   This information is not intended to replace advice given to you by your health care provider. Make sure you discuss any questions you have with your health care provider.   Document Released: 10/29/2000 Document Revised: 07/04/2013 Document Reviewed: 04/10/2013 Elsevier Interactive Patient Education 2016 Elsevier Inc.  

## 2016-06-28 NOTE — Progress Notes (Signed)
Subjective:    Patient ID: Erica Evans, female    DOB: 1951-09-25, 64 y.o.   MRN: [id number]  HPI   Patient here today for annual physical and follow up of chronic medical problems.  Outpatient Encounter Prescriptions as of 06/28/2016  Medication Sig  . busPIRone (BUSPAR) 15 MG tablet Take 1/2 to 1 tablet every 8 hours for nerves  . glipiZIDE (GLUCOTROL XL) 10 MG 24 hr tablet Take 1 tablet (10 mg total) by mouth daily.  Marland Kitchen JANUMET 50-1000 MG tablet TAKE 1 TABLET BY MOUTH 2 (TWO) TIMES DAILY.  Marland Kitchen lisinopril (PRINIVIL,ZESTRIL) 20 MG tablet TAKE 1 TABLET (20 MG TOTAL) BY MOUTH DAILY.  Marland Kitchen Vitamin D, Ergocalciferol, (DRISDOL) 50000 units CAPS capsule TAKE 1 CAPSULE (50,000 UNITS TOTAL) BY MOUTH EVERY 7 (SEVEN) DAYS.     Hypertension Currently taking lisinopril which keeps blood pressure under control- no c/o side effects- trying to  diet and  exercise. Hyperlipidemia Was on crestor but coupon card ran out so she has not taken it in over a month. Has lost a little weight. Diabetes Currently on jamumet and glipizide-  At last visit she had cut her janumet dose in half at night because fasting blood sugars were low in the mornings. But she has not followed up in over a year. She is back to full dose of janumet BID. Fasting blood sugars have been  Averaging 130. Highest reading has been 139 in the last month. She says that a few months ago her blood sugars have been out of control but has gotten back on track the last month. GAD buspar keeps her calm without side effects. Vitamin d def OTC vitamin d daily- does not take everyday.  Review of Systems  Constitutional: Negative.   HENT: Negative.   Respiratory: Negative.   Cardiovascular: Negative.   Gastrointestinal: Negative.   Neurological: Negative.   Psychiatric/Behavioral: Negative.   All other systems reviewed and are negative.      Objective:   Physical Exam  Constitutional: She is oriented to person, place, and time. She  appears well-developed and well-nourished.  HENT:  Nose: Nose normal.  Mouth/Throat: Oropharynx is clear and moist.  Eyes: EOM are normal.  Neck: Trachea normal, normal range of motion and full passive range of motion without pain. Neck supple. No JVD present. Carotid bruit is not present. No thyromegaly present.  Cardiovascular: Normal rate, regular rhythm, normal heart sounds and intact distal pulses.  Exam reveals no gallop and no friction rub.   No murmur heard. Pulmonary/Chest: Effort normal and breath sounds normal.  Abdominal: Soft. Bowel sounds are normal. She exhibits no distension and no mass. There is no tenderness.  Musculoskeletal: Normal range of motion.  Lymphadenopathy:    She has no cervical adenopathy.  Neurological: She is alert and oriented to person, place, and time. She has normal reflexes.  Skin: Skin is warm and dry.  Psychiatric: She has a normal mood and affect. Her behavior is normal. Judgment and thought content normal.    BP 133/66   Pulse 66   Temp 97.2 F (36.2 C) (Oral)   Ht _0  (1.702 m)   Wt 229 lb (103.9 kg)   BMI 35.87 kg/m '      Assessment & Plan:  1. Essential hypertension Do not add salt to diet - CMP14+EGFR  2. Type 2 diabetes mellitus without complication, without long-term current use of insulin (HCC) Continue to watch carbs in diet - Bayer DCA Hb A1c  Waived - glipiZIDE (GLUCOTROL XL) 10 MG 24 hr tablet; Take 1 tablet (10 mg total) by mouth daily.  Dispense: 90 tablet; Refill: 2  3. Hyperlipidemia Low fat diet - Lipid panel  4. BMI 34.0-34.9,adult Discussed diet and exercise for person with BMI >25 Will recheck weight in 3-6 months  5. GAD (generalized anxiety disorder) Stress management  6. Vitamin D deficiency continue vitamin d OTC   Labs pending Health maintenance reviewed Diet and exercise encouraged Continue all meds Follow up  In 3 months   [place], FNP

## 2016-06-29 LAB — CMP14+EGFR
A/G RATIO: 1.6 (ref 1.2–2.2)
ALK PHOS: 62 IU/L (ref 39–117)
ALT: 26 IU/L (ref 0–32)
AST: 25 IU/L (ref 0–40)
Albumin: 4.3 g/dL (ref 3.6–4.8)
BUN / CREAT RATIO: 13 (ref 12–28)
BUN: 9 mg/dL (ref 8–27)
Bilirubin Total: 0.3 mg/dL (ref 0.0–1.2)
CO2: 22 mmol/L (ref 18–29)
Calcium: 9.6 mg/dL (ref 8.7–10.3)
Chloride: 99 mmol/L (ref 96–106)
Creatinine, Ser: 0.7 mg/dL (ref 0.57–1.00)
GFR calc Af Amer: 107 mL/min/{1.73_m2} (ref >59)
GFR, EST NON AFRICAN AMERICAN: 93 mL/min/{1.73_m2} (ref >59)
GLOBULIN, TOTAL: 2.7 g/dL (ref 1.5–4.5)
Glucose: 159 mg/dL — ABNORMAL HIGH (ref 65–99)
POTASSIUM: 4.7 mmol/L (ref 3.5–5.2)
SODIUM: 140 mmol/L (ref 134–144)
Total Protein: 7 g/dL (ref 6.0–8.5)

## 2016-06-29 LAB — LIPID PANEL
CHOL/HDL RATIO: 3.7 ratio (ref 0.0–4.4)
CHOLESTEROL TOTAL: 175 mg/dL (ref 100–199)
HDL: 47 mg/dL (ref >39)
LDL Calculated: 91 mg/dL (ref 0–99)
Triglycerides: 183 mg/dL — ABNORMAL HIGH (ref 0–149)
VLDL Cholesterol Cal: 37 mg/dL (ref 5–40)

## 2016-06-30 ENCOUNTER — Telehealth: Payer: Self-pay | Admitting: Nurse Practitioner

## 2016-07-01 NOTE — Telephone Encounter (Signed)
Labs were mailed today. °

## 2016-07-01 NOTE — Telephone Encounter (Signed)
Should automatically post on my chart- can mail a copy if would like

## 2016-07-09 ENCOUNTER — Other Ambulatory Visit: Payer: Self-pay | Admitting: Family

## 2016-08-04 ENCOUNTER — Ambulatory Visit (INDEPENDENT_AMBULATORY_CARE_PROVIDER_SITE_OTHER): Payer: BLUE CROSS/BLUE SHIELD

## 2016-08-04 DIAGNOSIS — Z23 Encounter for immunization: Secondary | ICD-10-CM

## 2016-09-29 ENCOUNTER — Telehealth: Payer: Self-pay | Admitting: Nurse Practitioner

## 2016-09-29 NOTE — Telephone Encounter (Signed)
Patient states that [facility] in [place] has told her three times that she needed to be on a Stain because of her diabetes.  Patient would like to know if she can get put on a statin. Please advise.

## 2016-09-30 NOTE — Telephone Encounter (Signed)
Pt aware.

## 2016-09-30 NOTE — Telephone Encounter (Signed)
Does not need to be on statin - her LDL were 91

## 2016-10-05 ENCOUNTER — Telehealth: Payer: Self-pay | Admitting: Nurse Practitioner

## 2016-10-05 DIAGNOSIS — Z1231 Encounter for screening mammogram for malignant neoplasm of breast: Secondary | ICD-10-CM

## 2016-10-05 NOTE — Telephone Encounter (Signed)
Please schedule

## 2016-10-11 NOTE — Telephone Encounter (Signed)
No and referral placed for patient.

## 2016-10-11 NOTE — Telephone Encounter (Signed)
Has she ever had an abnormal mammo in past? If so needs to go back to same place.

## 2016-10-25 ENCOUNTER — Ambulatory Visit (HOSPITAL_COMMUNITY)
Admission: RE | Admit: 2016-10-25 | Discharge: 2016-10-25 | Disposition: A | Discharge status: Home / Self Care | Payer: BLUE CROSS/BLUE SHIELD | Source: Ambulatory Visit | Attending: Nurse Practitioner | Admitting: Nurse Practitioner

## 2016-10-25 ENCOUNTER — Ambulatory Visit (HOSPITAL_COMMUNITY): Payer: Self-pay

## 2016-10-25 ENCOUNTER — Other Ambulatory Visit: Payer: Self-pay | Admitting: Nurse Practitioner

## 2016-10-25 DIAGNOSIS — Z1231 Encounter for screening mammogram for malignant neoplasm of breast: Secondary | ICD-10-CM

## 2016-11-05 ENCOUNTER — Telehealth: Payer: Self-pay

## 2016-11-05 NOTE — Telephone Encounter (Signed)
Forwarding to PCP/MMM 

## 2016-11-09 NOTE — Telephone Encounter (Signed)
Pt aware to make appt here

## 2016-11-09 NOTE — Telephone Encounter (Signed)
NTBS here first

## 2017-01-12 ENCOUNTER — Encounter: Payer: BLUE CROSS/BLUE SHIELD | Admitting: *Deleted

## 2017-03-17 ENCOUNTER — Other Ambulatory Visit: Payer: Self-pay | Admitting: Nurse Practitioner

## 2017-03-17 NOTE — Telephone Encounter (Signed)
Last refill without being seen 

## 2017-03-18 NOTE — Telephone Encounter (Signed)
Left message- rx sent and will need to be seen for any more refills.

## 2017-03-25 ENCOUNTER — Other Ambulatory Visit: Payer: Self-pay | Admitting: Nurse Practitioner

## 2017-03-25 DIAGNOSIS — E119 Type 2 diabetes mellitus without complications: Secondary | ICD-10-CM

## 2017-04-16 ENCOUNTER — Other Ambulatory Visit: Payer: Self-pay | Admitting: Family

## 2017-04-17 ENCOUNTER — Other Ambulatory Visit: Payer: Self-pay | Admitting: Nurse Practitioner

## 2017-04-18 ENCOUNTER — Encounter: Payer: Self-pay | Admitting: Nurse Practitioner

## 2017-04-18 ENCOUNTER — Ambulatory Visit (INDEPENDENT_AMBULATORY_CARE_PROVIDER_SITE_OTHER): Payer: BLUE CROSS/BLUE SHIELD | Admitting: Nurse Practitioner

## 2017-04-18 VITALS — BP 132/74 | HR 66 | Temp 97.0°F | Ht 67.0 in | Wt 230.0 lb

## 2017-04-18 DIAGNOSIS — E119 Type 2 diabetes mellitus without complications: Secondary | ICD-10-CM | POA: Diagnosis not present

## 2017-04-18 DIAGNOSIS — Z6834 Body mass index (BMI) 34.0-34.9, adult: Secondary | ICD-10-CM | POA: Diagnosis not present

## 2017-04-18 DIAGNOSIS — E559 Vitamin D deficiency, unspecified: Secondary | ICD-10-CM | POA: Diagnosis not present

## 2017-04-18 DIAGNOSIS — E785 Hyperlipidemia, unspecified: Secondary | ICD-10-CM | POA: Diagnosis not present

## 2017-04-18 DIAGNOSIS — I10 Essential (primary) hypertension: Secondary | ICD-10-CM

## 2017-04-18 DIAGNOSIS — F411 Generalized anxiety disorder: Secondary | ICD-10-CM

## 2017-04-18 DIAGNOSIS — M5442 Lumbago with sciatica, left side: Secondary | ICD-10-CM

## 2017-04-18 LAB — BAYER DCA HB A1C WAIVED: HB A1C (BAYER DCA - WAIVED): 7.3 % — ABNORMAL HIGH (ref <7.0)

## 2017-04-18 MED ORDER — LISINOPRIL 20 MG PO TABS: 20.0000 mg | ORAL_TABLET | Freq: Every day | ORAL | 3 refills | Status: DC

## 2017-04-18 MED ORDER — SITAGLIPTIN PHOS-METFORMIN HCL 50-1000 MG PO TABS: 1.0000 | ORAL_TABLET | Freq: Two times a day (BID) | ORAL | 1 refills | Status: DC

## 2017-04-18 MED ORDER — CYCLOBENZAPRINE HCL 5 MG PO TABS: 5.0000 mg | ORAL_TABLET | Freq: Three times a day (TID) | ORAL | 1 refills | Status: DC | PRN

## 2017-04-18 MED ORDER — NAPROXEN 500 MG PO TABS
500.0000 mg | ORAL_TABLET | Freq: Two times a day (BID) | ORAL | 1 refills | Status: DC
Start: 2017-04-18 — End: 2017-04-28

## 2017-04-18 NOTE — Telephone Encounter (Signed)
Last refill without being seen 

## 2017-04-18 NOTE — Telephone Encounter (Signed)
Last labs 06/2016

## 2017-04-18 NOTE — Telephone Encounter (Signed)
Last vit. D 2016

## 2017-04-18 NOTE — Progress Notes (Signed)
Subjective:    Patient ID: Erica Evans, female    DOB: Feb 08, 1951, 66 y.o.   MRN: [id number]  HPI  Erica Evans is here today for follow up of chronic medical problem.  Outpatient Encounter Prescriptions as of 04/18/2017  Medication Sig  . busPIRone (BUSPAR) 15 MG tablet Take 1/2 to 1 tablet every 8 hours for nerves  . glipiZIDE (GLUCOTROL XL) 10 MG 24 hr tablet TAKE 1 TABLET BY MOUTH DAILY **NEEDS OFFICE VISIT FOR NEXT REFILL**  . JANUMET 50-1000 MG tablet TAKE 1 TABLET BY MOUTH TWICE A DAY  . lisinopril (PRINIVIL,ZESTRIL) 20 MG tablet TAKE 1 TABLET (20 MG TOTAL) BY MOUTH DAILY.  . sitaGLIPtin-metformin (JANUMET) 50-1000 MG tablet Take 1 tablet by mouth 2 (two) times daily with a meal.  . Vitamin D, Ergocalciferol, (DRISDOL) 50000 units CAPS capsule TAKE 1 CAPSULE (50,000 UNITS TOTAL) BY MOUTH EVERY 7 (SEVEN) DAYS.   No facility-administered encounter medications on file as of 04/18/2017.     1. Essential hypertension  No c/o chest pain,SOB or HA. Doe snot check blood pressures at home  2. Type 2 diabetes mellitus without complication, without long-term current use of insulin (HCC) last HGBA!C was 7.4%. Does ot check blood sugar everyday but are below 130 when she does check them  3. Hyperlipidemia, unspecified hyperlipidemia type  tries to watch diet  4. Vitamin D deficiency  Has not had levels checked lately  5. GAD (generalized anxiety disorder)  Use to take buspar but has not needed as of late  6. BMI 34.0-34.9,adult  No recent weight gain or weight loss    New complaints: C/O low back pain- has had intermittently for over 2 months- flared up again yesterday after driving to [facility] and sitting in car for couple of hours. Laying down decreases pain- being up sitting or standing increases pain. Rates pain 9/10 currently. Has been taking advil OTC that has not helped.     Review of Systems  Constitutional: Negative for diaphoresis.  HENT: Negative.   Eyes:  Negative for pain.  Respiratory: Negative for shortness of breath.   Cardiovascular: Negative for chest pain, palpitations and leg swelling.  Gastrointestinal: Negative for abdominal pain.  Endocrine: Negative for polydipsia.  Genitourinary: Negative.   Musculoskeletal: Negative.   Skin: Negative for rash.  Neurological: Negative for dizziness, weakness and headaches.  Hematological: Does not bruise/bleed easily.  Psychiatric/Behavioral: Negative.   All other systems reviewed and are negative.      Objective:   Physical Exam  Constitutional: She is oriented to person, place, and time. She appears well-developed and well-nourished.  HENT:  Nose: Nose normal.  Mouth/Throat: Oropharynx is clear and moist.  Eyes: EOM are normal.  Neck: Trachea normal, normal range of motion and full passive range of motion without pain. Neck supple. No JVD present. Carotid bruit is not present. No thyromegaly present.  Cardiovascular: Normal rate, regular rhythm, normal heart sounds and intact distal pulses.  Exam reveals no gallop and no friction rub.   No murmur heard. Pulmonary/Chest: Effort normal and breath sounds normal.  Abdominal: Soft. Bowel sounds are normal. She exhibits no distension and no mass. There is no tenderness.  Musculoskeletal: Normal range of motion.  Lymphadenopathy:    She has no cervical adenopathy.  Neurological: She is alert and oriented to person, place, and time. She has normal reflexes.  Skin: Skin is warm and dry.  Psychiatric: She has a normal mood and affect. Her behavior is  normal. Judgment and thought content normal.   BP 132/74   Pulse 66   Temp 97 F (36.1 C) (Oral)   Ht _0  (1.702 m)   Wt 230 lb (104.3 kg)   SpO2 97%   BMI 36.02 kg/m   hgba1c 7.3%       Assessment & Plan:  1. Essential hypertension Low sodium diet - CMP14+EGFR - lisinopril (PRINIVIL,ZESTRIL) 20 MG tablet; Take 1 tablet (20 mg total) by mouth daily.  Dispense: 90 tablet; Refill:  3  2. Type 2 diabetes mellitus without complication, without long-term current use of insulin (HCC) Continue to watch carbsin diet - Bayer DCA Hb A1c Waived - Microalbumin / creatinine urine ratio - sitaGLIPtin-metformin (JANUMET) 50-1000 MG tablet; Take 1 tablet by mouth 2 (two) times daily with a meal.  Dispense: 180 tablet; Refill: 1  3. Hyperlipidemia, unspecified hyperlipidemia type Low fat diet - Lipid panel  4. Vitamin D deficiency Will check levels today  5. GAD (generalized anxiety disorder) Stress management  6. BMI 34.0-34.9,adult Discussed diet and exercise for person with BMI >25 Will recheck weight in 3-6 months  7. Acute left-sided low back pain with left-sided sciatica Moist heat to back Rest\ RTO if not inproving in 3-4 days -naproxen (NAPROSYN) 500 MG tablet; Take 1 tablet (500 mg total) by mouth 2 (two) times daily with a meal.  Dispense: 60 tablet; Refill: 1 - cyclobenzaprine (FLEXERIL) 5 MG tablet; Take 1 tablet (5 mg total) by mouth 3 (three) times daily as needed for muscle spasms.  Dispense: 30 tablet; Refill: 1    Labs pending Health maintenance reviewed Diet and exercise encouraged Continue all meds Follow up  In 3 months   [place], FNP

## 2017-04-18 NOTE — Patient Instructions (Signed)

## 2017-04-18 NOTE — Telephone Encounter (Signed)
Pt was just seen today

## 2017-04-19 ENCOUNTER — Ambulatory Visit: Payer: BLUE CROSS/BLUE SHIELD | Admitting: Nurse Practitioner

## 2017-04-19 LAB — CMP14+EGFR
ALBUMIN: 4.3 g/dL (ref 3.6–4.8)
ALT: 20 IU/L (ref 0–32)
AST: 19 IU/L (ref 0–40)
Albumin/Globulin Ratio: 1.7 (ref 1.2–2.2)
Alkaline Phosphatase: 61 IU/L (ref 39–117)
BUN / CREAT RATIO: 13 (ref 12–28)
BUN: 10 mg/dL (ref 8–27)
Bilirubin Total: 0.3 mg/dL (ref 0.0–1.2)
CO2: 25 mmol/L (ref 18–29)
CREATININE: 0.75 mg/dL (ref 0.57–1.00)
Calcium: 9.7 mg/dL (ref 8.7–10.3)
Chloride: 95 mmol/L — ABNORMAL LOW (ref 96–106)
GFR calc Af Amer: 97 mL/min/{1.73_m2} (ref >59)
GFR calc non Af Amer: 85 mL/min/{1.73_m2} (ref >59)
GLUCOSE: 154 mg/dL — AB (ref 65–99)
Globulin, Total: 2.6 g/dL (ref 1.5–4.5)
Potassium: 5.4 mmol/L — ABNORMAL HIGH (ref 3.5–5.2)
Sodium: 134 mmol/L (ref 134–144)
TOTAL PROTEIN: 6.9 g/dL (ref 6.0–8.5)

## 2017-04-19 LAB — LIPID PANEL
CHOLESTEROL TOTAL: 195 mg/dL (ref 100–199)
Chol/HDL Ratio: 4 ratio (ref 0.0–4.4)
HDL: 49 mg/dL (ref >39)
LDL CALC: 108 mg/dL — AB (ref 0–99)
Triglycerides: 188 mg/dL — ABNORMAL HIGH (ref 0–149)
VLDL CHOLESTEROL CAL: 38 mg/dL (ref 5–40)

## 2017-04-19 LAB — VITAMIN D 25 HYDROXY (VIT D DEFICIENCY, FRACTURES): Vit D, 25-Hydroxy: 37 ng/mL (ref 30.0–100.0)

## 2017-04-20 ENCOUNTER — Other Ambulatory Visit: Payer: BLUE CROSS/BLUE SHIELD

## 2017-04-20 ENCOUNTER — Other Ambulatory Visit: Payer: Self-pay | Admitting: *Deleted

## 2017-04-20 DIAGNOSIS — E875 Hyperkalemia: Secondary | ICD-10-CM

## 2017-04-21 ENCOUNTER — Other Ambulatory Visit: Payer: Self-pay | Admitting: Nurse Practitioner

## 2017-04-21 LAB — CMP14+EGFR
A/G RATIO: 1.7 (ref 1.2–2.2)
ALBUMIN: 4.3 g/dL (ref 3.6–4.8)
ALT: 21 IU/L (ref 0–32)
AST: 19 IU/L (ref 0–40)
Alkaline Phosphatase: 60 IU/L (ref 39–117)
BILIRUBIN TOTAL: 0.2 mg/dL (ref 0.0–1.2)
BUN / CREAT RATIO: 13 (ref 12–28)
BUN: 10 mg/dL (ref 8–27)
CHLORIDE: 97 mmol/L (ref 96–106)
CO2: 23 mmol/L (ref 18–29)
Calcium: 9.6 mg/dL (ref 8.7–10.3)
Creatinine, Ser: 0.79 mg/dL (ref 0.57–1.00)
GFR calc non Af Amer: 79 mL/min/{1.73_m2} (ref >59)
GFR, EST AFRICAN AMERICAN: 91 mL/min/{1.73_m2} (ref >59)
Globulin, Total: 2.5 g/dL (ref 1.5–4.5)
Glucose: 147 mg/dL — ABNORMAL HIGH (ref 65–99)
POTASSIUM: 5.8 mmol/L — AB (ref 3.5–5.2)
Sodium: 135 mmol/L (ref 134–144)
TOTAL PROTEIN: 6.8 g/dL (ref 6.0–8.5)

## 2017-04-21 MED ORDER — AMLODIPINE BESYLATE 5 MG PO TABS: 5.0000 mg | ORAL_TABLET | Freq: Every day | ORAL | 1 refills | Status: DC

## 2017-04-25 ENCOUNTER — Ambulatory Visit (INDEPENDENT_AMBULATORY_CARE_PROVIDER_SITE_OTHER): Payer: Commercial Managed Care - PPO | Admitting: Family Medicine

## 2017-04-25 ENCOUNTER — Ambulatory Visit (INDEPENDENT_AMBULATORY_CARE_PROVIDER_SITE_OTHER): Payer: BLUE CROSS/BLUE SHIELD

## 2017-04-25 ENCOUNTER — Encounter: Payer: Self-pay | Admitting: Family Medicine

## 2017-04-25 VITALS — BP 164/94 | HR 78 | Temp 97.1°F | Ht 67.0 in | Wt 229.0 lb

## 2017-04-25 DIAGNOSIS — E875 Hyperkalemia: Secondary | ICD-10-CM

## 2017-04-25 DIAGNOSIS — R300 Dysuria: Secondary | ICD-10-CM

## 2017-04-25 DIAGNOSIS — M546 Pain in thoracic spine: Secondary | ICD-10-CM

## 2017-04-25 DIAGNOSIS — I10 Essential (primary) hypertension: Secondary | ICD-10-CM

## 2017-04-25 LAB — URINALYSIS, COMPLETE
Bilirubin, UA: NEGATIVE
GLUCOSE, UA: NEGATIVE
Ketones, UA: NEGATIVE
Leukocytes, UA: NEGATIVE
NITRITE UA: NEGATIVE
Protein, UA: NEGATIVE
RBC, UA: NEGATIVE
Specific Gravity, UA: <1.005 — ABNORMAL LOW (ref 1.005–1.030)
UUROB: 0.2 mg/dL (ref 0.2–1.0)
pH, UA: 5.5 (ref 5.0–7.5)

## 2017-04-25 LAB — MICROSCOPIC EXAMINATION
BACTERIA UA: NONE SEEN
RBC MICROSCOPIC, UA: NONE SEEN /HPF (ref 0–?)
Renal Epithel, UA: NONE SEEN /hpf
WBC UA: NONE SEEN /HPF (ref 0–?)

## 2017-04-25 MED ORDER — HYDROCHLOROTHIAZIDE 25 MG PO TABS: 25.0000 mg | ORAL_TABLET | Freq: Every day | ORAL | 2 refills | Status: DC

## 2017-04-25 NOTE — Progress Notes (Signed)
   HPI  Patient presents today here for follow-up hypertension, also discuss dysuria and back pain.  Dysuria One-day symptoms, improved somewhat today, intermittent. No fever, chills, sweats.  Back pain Started after a drive to the leg, previously was left-sided low back pain, has migrated upwards over thoracic back and is now bilateral. Not really helped by naproxen or Flexeril. No trauma. it does seem to be radiating around to the left lower rib cage area.  Hypertension Blood pressure previously well controlled on lisinopril alone, patient was stopped due to hyperkalemia 5.8. Patient requests lisinopril again. Amlodipine causing mild leg edema. Did take amlodipine this morning.   PMH: Smoking status noted ROS: Per HPI  Objective: BP (!) 213/101   Pulse 78   Temp 97.1 F (36.2 C) (Oral)   Ht 5\' 7"  (1.702 m)   Wt 229 lb (103.9 kg)   BMI 35.87 kg/m  Gen: NAD, alert, cooperative with exam HEENT: NCAT CV: RRR, good S1/S2, no murmur Resp: CTABL, no wheezes, non-labored Abd: Mild tenderness to palpation in left upper quadrant, no guarding, no suprapubic tenderness Ext: No edema, warm Neuro: Alert and oriented, No gross deficits MSK Mild tenderness to palpation in bilateral thoracic paraspinal muscles  Assessment and plan:  # Hypertension Severe range, however no red flags for hypertensive emergency. Improved after 15 minutes of resting Start HCTZ, discontinue amlodipine. Follow-up one to 2 weeks with PCP  I agree with her, potassium of 5.8 is likely too high to continue lisinopril.  # Dysuria Urinalysis does not indicate UTI, physical exam also inconsistent with UTI. Culture.  # Thoracic back pain Muscle spasms most likely. Also could be intercostal rib pain. Continue current regimen, add heat. Watchful waiting    Orders Placed This Encounter  Procedures  . Urine Culture  . DG Thoracic Spine 2 View    Standing Status:   Future    Standing Expiration  Date:   06/25/2018    Order Specific Question:   Reason for Exam (SYMPTOM  OR DIAGNOSIS REQUIRED)    Answer:   back pain    Order Specific Question:   Preferred imaging location?    Answer:   Internal  . Urinalysis, Complete    Meds ordered this encounter  Medications  . hydrochlorothiazide (HYDRODIURIL) 25 MG tablet    Sig: Take 1 tablet (25 mg total) by mouth daily.    Dispense:  30 tablet    Refill:  [place], MD [facility] 04/25/2017, 2:02 PM

## 2017-04-25 NOTE — Addendum Note (Signed)
Addended by: Liliane Bade on: 04/25/2017 02:12 PM   Modules accepted: Orders

## 2017-04-25 NOTE — Patient Instructions (Signed)
Great to see you!  Come back in 1-2 weeks to see [facility] HCTZ, stop amlodipine  Please seek medical help right away for severe headache, chest pain, or any difficulty breathing.

## 2017-04-26 LAB — BMP8+EGFR
BUN / CREAT RATIO: 12 (ref 12–28)
BUN: 9 mg/dL (ref 8–27)
CO2: 24 mmol/L (ref 20–29)
CREATININE: 0.76 mg/dL (ref 0.57–1.00)
Calcium: 9.8 mg/dL (ref 8.7–10.3)
Chloride: 92 mmol/L — ABNORMAL LOW (ref 96–106)
GFR, EST AFRICAN AMERICAN: 96 mL/min/{1.73_m2} (ref >59)
GFR, EST NON AFRICAN AMERICAN: 83 mL/min/{1.73_m2} (ref >59)
GLUCOSE: 134 mg/dL — AB (ref 65–99)
Potassium: 5.2 mmol/L (ref 3.5–5.2)
SODIUM: 135 mmol/L (ref 134–144)

## 2017-04-27 ENCOUNTER — Telehealth: Payer: Self-pay | Admitting: Family Medicine

## 2017-04-27 LAB — URINE CULTURE

## 2017-04-27 NOTE — Telephone Encounter (Signed)
Was seen 04-25-17 by Dr. Wendi Snipes for posterior chest pain and had x-ray. Requesting something for pain until ortho appointment. Please advise.

## 2017-04-27 NOTE — Telephone Encounter (Signed)
Forward to Dr. Wendi Snipes

## 2017-04-28 ENCOUNTER — Other Ambulatory Visit: Payer: Self-pay | Admitting: *Deleted

## 2017-04-28 ENCOUNTER — Telehealth: Payer: Self-pay | Admitting: Orthopedic Surgery

## 2017-04-28 ENCOUNTER — Encounter: Payer: Self-pay | Admitting: Family Medicine

## 2017-04-28 DIAGNOSIS — M549 Dorsalgia, unspecified: Secondary | ICD-10-CM

## 2017-04-28 MED ORDER — CIPROFLOXACIN HCL 250 MG PO TABS: 250.0000 mg | ORAL_TABLET | Freq: Two times a day (BID) | ORAL | 0 refills | Status: DC

## 2017-04-28 MED ORDER — DICLOFENAC SODIUM 75 MG PO TBEC: 75.0000 mg | DELAYED_RELEASE_TABLET | Freq: Two times a day (BID) | ORAL | 0 refills | Status: DC

## 2017-04-28 NOTE — Telephone Encounter (Signed)
Needs to be seen for anything controlled. Can try voltaren as an alternative.   Laroy Apple, MD [facility] 04/28/2017, 7:42 AM

## 2017-04-28 NOTE — Telephone Encounter (Signed)
She needs to have failed PT and 6 weeks of nsaids first

## 2017-04-28 NOTE — Telephone Encounter (Signed)
Pt aware Rx sent in & recommendations

## 2017-04-28 NOTE — Telephone Encounter (Signed)
Called this patient to setup an appointment for back pain per W/Q Referral from [facility] . She stated that she had an x-ray done on Monday 04/25/17. Per patient she has had no surgeries or PT. I explained to her that you would need to review her x-ray and notes possibly before making an appointment.  Please advise

## 2017-05-10 ENCOUNTER — Other Ambulatory Visit: Payer: Self-pay | Admitting: Nurse Practitioner

## 2017-05-10 ENCOUNTER — Telehealth: Payer: Self-pay | Admitting: Nurse Practitioner

## 2017-05-10 DIAGNOSIS — M5442 Lumbago with sciatica, left side: Secondary | ICD-10-CM

## 2017-05-10 MED ORDER — CYCLOBENZAPRINE HCL 5 MG PO TABS: 5.0000 mg | ORAL_TABLET | Freq: Three times a day (TID) | ORAL | 1 refills | Status: DC | PRN

## 2017-05-10 NOTE — Progress Notes (Signed)
Referral made for PT-call if do not hear anything by next week

## 2017-05-10 NOTE — Telephone Encounter (Signed)
Patient requesting refill on flexeril. Please review referral notes.

## 2017-05-10 NOTE — Telephone Encounter (Signed)
Pt aware refill has been sent to pharmacy

## 2017-05-10 NOTE — Telephone Encounter (Signed)
Flexeril rx sent to pharmacy

## 2017-05-10 NOTE — Progress Notes (Signed)
Pt aware.

## 2017-05-10 NOTE — Telephone Encounter (Signed)
Let patient know and see if she agrees to physical therapy

## 2017-05-10 NOTE — Addendum Note (Signed)
Addended by: Chevis Pretty on: 05/10/2017 12:14 PM   Modules accepted: Orders

## 2017-05-10 NOTE — Telephone Encounter (Signed)
Patient aware that referral to ortho was denied.  Patient is to have 6 weeks of failed PT and NSAIDs.  Patient is willing to try PT

## 2017-05-11 ENCOUNTER — Other Ambulatory Visit: Payer: Self-pay | Admitting: Family Medicine

## 2017-05-23 ENCOUNTER — Ambulatory Visit: Payer: Commercial Managed Care - PPO | Attending: Nurse Practitioner | Admitting: Physical Therapy

## 2017-05-23 DIAGNOSIS — M546 Pain in thoracic spine: Secondary | ICD-10-CM | POA: Diagnosis present

## 2017-05-23 DIAGNOSIS — R293 Abnormal posture: Secondary | ICD-10-CM | POA: Diagnosis present

## 2017-05-23 NOTE — Therapy (Signed)
[facility] [place], [place], [address] Phone: [phone]   Fax:  [phone]  Physical Therapy Evaluation  Patient Details  Name: Erica Evans MRN: [id number] Date of Birth: [birth year] Referring Provider: Mary-Margaret Hassell Done.  Encounter Date: 05/23/2017      PT End of Session - 05/23/17 1005    Visit Number 1   Number of Visits 12   Date for PT Re-Evaluation 07/04/17   PT Start Time 0951   PT Stop Time 1035   PT Time Calculation (min) 44 min   Activity Tolerance Patient tolerated treatment well   Behavior During Therapy [facility] for tasks assessed/performed      Past Medical History:  Diagnosis Date  . Diabetes mellitus without complication ([place])   . Hyperlipidemia   . Hypertension   . Palpitations   . Vertigo     No past surgical history on file.  There were no vitals filed for this visit.       Subjective Assessment - 05/23/17 1007    Subjective The patient reports back pain with an onset around June 1st of 2018.  She states tha pain was severe but is now improving.  She reports pain coming from her mid-back and radiating into her ribs toward her chest.  Today, at rest, she reports a pain-level of 1/10.  Although picking up objects and bending increase her pain to higher level.   Pertinent History Patient states she has OP.   Patient Stated Goals Get out of pain.   Currently in Pain? Yes   Pain Score 1    Pain Location Back   Pain Orientation Right;Left   Pain Descriptors / Indicators Dull   Pain Type Acute pain   Pain Radiating Towards Right > left.   Pain Onset More than a month ago   Pain Frequency Constant   Aggravating Factors  See above.   Pain Relieving Factors See above.            [facility] PT Assessment - 05/23/17 0001      Assessment   Medical Diagnosis Acute back pain.   Referring Provider Mary-Margaret Hassell Done.   Onset Date/Surgical Date --  04/15/17.     Precautions   Precautions  --  Patient states she has OP.     Restrictions   Weight Bearing Restrictions No     Balance Screen   Has the patient fallen in the past 6 months No   Has the patient had a decrease in activity level because of a fear of falling?  Yes   Is the patient reluctant to leave their home because of a fear of falling?  No     Home Environment   Living Environment Private residence     Prior Function   Level of Independence Independent     Posture/Postural Control   Posture/Postural Control Postural limitations   Postural Limitations Rounded Shoulders;Forward head;Increased thoracic kyphosis     ROM / Strength   AROM / PROM / Strength Strength  Functional spinal movement without pain increase.     Strength   Overall Strength Comments Normal bilateral LE strength.     Palpation   Palpation comment Tender to palpation just right of T5-7 spinous processes.  Increased tone of right thoracic paraspinal musculature.     Special Tests    Special Tests --  Normal LE DTR's.     Ambulation/Gait   Gait Comments WNL.  Objective measurements completed on examination: See above findings.          [facility] Adult PT Treatment/Exercise - 05/23/17 0001      Modalities   Modalities Electrical Stimulation;Moist Heat     Moist Heat Therapy   Number Minutes Moist Heat 20 Minutes   Moist Heat Location --  Thoracic.     Acupuncturist Location Right T5-T7 region.   Electrical Stimulation Action Constant Pre-mod.   Electrical Stimulation Parameters 80-150 Hz x 20 minutes.   Electrical Stimulation Goals Tone;Pain                     PT Long Term Goals - 05/23/17 1020      PT LONG TERM GOAL #1   Title Independent with an HEP.   Time 6   Period Weeks   Status New     PT LONG TERM GOAL #2   Title Perform ADL's wiht pain not > 1-2/10.   Time 6   Period Weeks   Status New                Plan - 05/23/17 1016     Clinical Impression Statement Patient presents mid-thoracic pain with radiation bilateral to ribs right > left.  She reports her pain has steadily been improving and she is doing better.  Lifting and certain movements increase her pain and therefore is limiting her ability to perform ADL's as previous.  Patient will benefit from skilled physical therapy to address pain and associated functional limitations.   Clinical Presentation Stable   Clinical Decision Making Low   Rehab Potential Excellent   PT Frequency 2x / week   PT Duration 6 weeks   PT Treatment/Interventions ADLs/Self Care Home Management;Electrical Stimulation;Cryotherapy;Ultrasound;Moist Heat;Therapeutic activities;Therapeutic exercise;Patient/family education;Manual techniques   PT Next Visit Plan Modalites and STW/M to affected thoracic musculature f/b postural exercises and thoracic strengthening.   Consulted and Agree with Plan of Care Patient      Patient will benefit from skilled therapeutic intervention in order to improve the following deficits and impairments:  Decreased activity tolerance, Pain, Postural dysfunction  Visit Diagnosis: Pain in thoracic spine - Plan: PT plan of care cert/re-cert  Abnormal posture - Plan: PT plan of care cert/re-cert     Problem List Patient Active Problem List   Diagnosis Date Noted  . Vitamin D deficiency 06/27/2015  . BMI 34.0-34.9,adult 06/14/2014  . GAD (generalized anxiety disorder) 06/14/2014  . Hypertension 04/17/2013  . Diabetes ([place]) 04/17/2013    APPLEGATE, [place] MPT 05/23/2017, 10:57 AM  [facility] [address] [place], [place], [address] Phone: [phone]   Fax:  [phone]  Name: Erica Evans MRN: [id number] Date of Birth: Sep 07, 1951

## 2017-05-26 ENCOUNTER — Encounter: Payer: Self-pay | Admitting: Physical Therapy

## 2017-05-26 ENCOUNTER — Ambulatory Visit: Payer: Commercial Managed Care - PPO | Admitting: Physical Therapy

## 2017-05-26 DIAGNOSIS — M546 Pain in thoracic spine: Secondary | ICD-10-CM | POA: Diagnosis not present

## 2017-05-26 DIAGNOSIS — R293 Abnormal posture: Secondary | ICD-10-CM

## 2017-05-26 NOTE — Therapy (Addendum)
[facility] [place], [place], [address] Phone: [phone]   Fax:  [phone]  Physical Therapy Treatment  Patient Details  Name: Erica Evans MRN: [id number] Date of Birth: 1951-06-10 Referring Provider: Mary-Margaret Hassell Done.  Encounter Date: 05/26/2017      PT End of Session - 05/26/17 1518    Visit Number 2   Number of Visits 12   Date for PT Re-Evaluation 07/04/17   PT Start Time 0230   PT Stop Time 0329   PT Time Calculation (min) 59 min   Activity Tolerance Patient tolerated treatment well   Behavior During Therapy [facility] for tasks assessed/performed      Past Medical History:  Diagnosis Date  . Diabetes mellitus without complication ([place])   . Hyperlipidemia   . Hypertension   . Palpitations   . Vertigo     History reviewed. No pertinent surgical history.  There were no vitals filed for this visit.      Subjective Assessment - 05/26/17 1519    Subjective That treatment helped.  I'm doing mch better.   Currently in Pain? Yes   Pain Score 1    Pain Location Back   Pain Descriptors / Indicators Dull   Pain Type Acute pain   Pain Onset More than a month ago                         [facility] Adult PT Treatment/Exercise - 05/26/17 0001      Exercises   Exercises --     Modalities   Modalities Electrical Stimulation;Moist Heat;Ultrasound     Moist Heat Therapy   Number Minutes Moist Heat 20 Minutes   Moist Heat Location --  Thoracic spine.     Acupuncturist Location Right T5-T7.   Electrical Stimulation Action Constant Pre-mod.   Electrical Stimulation Parameters 80-150 hz x 20 minutes.   Electrical Stimulation Goals Tone;Pain     Ultrasound   Ultrasound Location Affected mid-thoracic region.   Ultrasound Parameters U/S at 1.50 W/CM2 x 12 minutes.     Manual Therapy   Manual Therapy Soft tissue mobilization   Manual therapy comments  Patient seated with head resting on 2 pillows on plinth:  STW/M x 11 minutes to affected thoracic region.                     PT Long Term Goals - 05/23/17 1020      PT LONG TERM GOAL #1   Title Independent with an HEP.   Time 6   Period Weeks   Status New     PT LONG TERM GOAL #2   Title Perform ADL's wiht pain not > 1-2/10.   Time 6   Period Weeks   Status New               Plan - 05/26/17 1522    Clinical Impression Statement Excellent response to treatment today.      Patient will benefit from skilled therapeutic intervention in order to improve the following deficits and impairments:  Decreased activity tolerance, Pain, Postural dysfunction  Visit Diagnosis: Pain in thoracic spine  Abnormal posture     Problem List Patient Active Problem List   Diagnosis Date Noted  . Vitamin D deficiency 06/27/2015  . BMI 34.0-34.9,adult 06/14/2014  . GAD (generalized anxiety disorder) 06/14/2014  . Hypertension 04/17/2013  . Diabetes ([place]) 04/17/2013    Hudsyn Barich,  [place] MPT 05/26/2017, 3:29 PM  [facility] [address] [place], [place], [address] Phone: [phone]   Fax:  [phone]  Name: Erica Evans MRN: [id number] Date of Birth: 02/19/1951  PHYSICAL THERAPY DISCHARGE SUMMARY  Visits from Start of Care: 2.  Current functional level related to goals / functional outcomes: See above.   Remaining deficits: Pt. did not return.   Education / Equipment:  Plan: Patient agrees to discharge.  Patient goals were not met. Patient is being discharged due to not returning since the last visit.  ?????         [place] Tyran Huser MPT

## 2017-06-06 ENCOUNTER — Other Ambulatory Visit: Payer: Self-pay | Admitting: Nurse Practitioner

## 2017-06-06 DIAGNOSIS — M5442 Lumbago with sciatica, left side: Secondary | ICD-10-CM

## 2017-07-03 ENCOUNTER — Other Ambulatory Visit: Payer: Self-pay | Admitting: Nurse Practitioner

## 2017-07-03 DIAGNOSIS — M5442 Lumbago with sciatica, left side: Secondary | ICD-10-CM

## 2017-07-11 ENCOUNTER — Encounter (INDEPENDENT_AMBULATORY_CARE_PROVIDER_SITE_OTHER): Payer: Self-pay | Admitting: *Deleted

## 2017-07-11 ENCOUNTER — Ambulatory Visit (INDEPENDENT_AMBULATORY_CARE_PROVIDER_SITE_OTHER): Payer: Commercial Managed Care - PPO

## 2017-07-11 ENCOUNTER — Ambulatory Visit (INDEPENDENT_AMBULATORY_CARE_PROVIDER_SITE_OTHER): Payer: Commercial Managed Care - PPO | Admitting: Family Medicine

## 2017-07-11 ENCOUNTER — Encounter: Payer: Self-pay | Admitting: Family Medicine

## 2017-07-11 VITALS — BP 148/93 | HR 87 | Temp 97.0°F | Ht 67.0 in | Wt 229.4 lb

## 2017-07-11 DIAGNOSIS — R0609 Other forms of dyspnea: Secondary | ICD-10-CM | POA: Diagnosis not present

## 2017-07-11 DIAGNOSIS — Z780 Asymptomatic menopausal state: Secondary | ICD-10-CM | POA: Diagnosis not present

## 2017-07-11 DIAGNOSIS — R0600 Dyspnea, unspecified: Secondary | ICD-10-CM

## 2017-07-11 DIAGNOSIS — Z0000 Encounter for general adult medical examination without abnormal findings: Secondary | ICD-10-CM | POA: Diagnosis not present

## 2017-07-11 DIAGNOSIS — I10 Essential (primary) hypertension: Secondary | ICD-10-CM

## 2017-07-11 DIAGNOSIS — M8000XD Age-related osteoporosis with current pathological fracture, unspecified site, subsequent encounter for fracture with routine healing: Secondary | ICD-10-CM

## 2017-07-11 MED ORDER — NAPROXEN 500 MG PO TABS: 500.0000 mg | ORAL_TABLET | Freq: Two times a day (BID) | ORAL | 1 refills | Status: DC

## 2017-07-11 MED ORDER — ALENDRONATE SODIUM 70 MG PO TABS: 70.0000 mg | ORAL_TABLET | ORAL | 3 refills | Status: DC

## 2017-07-11 NOTE — Patient Instructions (Signed)
Great to see you!  Health Maintenance, Female Adopting a healthy lifestyle and getting preventive care can go a long way to promote health and wellness. Talk with your health care provider about what schedule of regular examinations is right for you. This is a good chance for you to check in with your provider about disease prevention and staying healthy. In between checkups, there are plenty of things you can do on your own. Experts have done a lot of research about which lifestyle changes and preventive measures are most likely to keep you healthy. Ask your health care provider for more information. Weight and diet Eat a healthy diet  Be sure to include plenty of vegetables, fruits, low-fat dairy products, and lean protein.  Do not eat a lot of foods high in solid fats, added sugars, or salt.  Get regular exercise. This is one of the most important things you can do for your health. ? Most adults should exercise for at least 150 minutes each week. The exercise should increase your heart rate and make you sweat (moderate-intensity exercise). ? Most adults should also do strengthening exercises at least twice a week. This is in addition to the moderate-intensity exercise.  Maintain a healthy weight  Body mass index (BMI) is a measurement that can be used to identify possible weight problems. It estimates body fat based on height and weight. Your health care provider can help determine your BMI and help you achieve or maintain a healthy weight.  For females 9 years of age and older: ? A BMI below 18.5 is considered underweight. ? A BMI of 18.5 to 24.9 is normal. ? A BMI of 25 to 29.9 is considered overweight. ? A BMI of 30 and above is considered obese.  Watch levels of cholesterol and blood lipids  You should start having your blood tested for lipids and cholesterol at 65 years of age, then have this test every 5 years.  You may need to have your cholesterol levels checked more often  if: ? Your lipid or cholesterol levels are high. ? You are older than 65 years of age. ? You are at high risk for heart disease.  Cancer screening Lung Cancer  Lung cancer screening is recommended for adults 63-83 years old who are at high risk for lung cancer because of a history of smoking.  A yearly low-dose CT scan of the lungs is recommended for people who: ? Currently smoke. ? Have quit within the past 15 years. ? Have at least a 30-pack-year history of smoking. A pack year is smoking an average of one pack of cigarettes a day for 1 year.  Yearly screening should continue until it has been 15 years since you quit.  Yearly screening should stop if you develop a health problem that would prevent you from having lung cancer treatment.  Breast Cancer  Practice breast self-awareness. This means understanding how your breasts normally appear and feel.  It also means doing regular breast self-exams. Let your health care provider know about any changes, no matter how small.  If you are in your 20s or 30s, you should have a clinical breast exam (CBE) by a health care provider every 1-3 years as part of a regular health exam.  If you are 36 or older, have a CBE every year. Also consider having a breast X-ray (mammogram) every year.  If you have a family history of breast cancer, talk to your health care provider about genetic screening.  If  you are at high risk for breast cancer, talk to your health care provider about having an MRI and a mammogram every year.  Breast cancer gene (BRCA) assessment is recommended for women who have family members with BRCA-related cancers. BRCA-related cancers include: ? Breast. ? Ovarian. ? Tubal. ? Peritoneal cancers.  Results of the assessment will determine the need for genetic counseling and BRCA1 and BRCA2 testing.  Cervical Cancer Your health care provider may recommend that you be screened regularly for cancer of the pelvic organs  (ovaries, uterus, and vagina). This screening involves a pelvic examination, including checking for microscopic changes to the surface of your cervix (Pap test). You may be encouraged to have this screening done every 3 years, beginning at age 21.  For women ages 30-65, health care providers may recommend pelvic exams and Pap testing every 3 years, or they may recommend the Pap and pelvic exam, combined with testing for human papilloma virus (HPV), every 5 years. Some types of HPV increase your risk of cervical cancer. Testing for HPV may also be done on women of any age with unclear Pap test results.  Other health care providers may not recommend any screening for nonpregnant women who are considered low risk for pelvic cancer and who do not have symptoms. Ask your health care provider if a screening pelvic exam is right for you.  If you have had past treatment for cervical cancer or a condition that could lead to cancer, you need Pap tests and screening for cancer for at least 20 years after your treatment. If Pap tests have been discontinued, your risk factors (such as having a new sexual partner) need to be reassessed to determine if screening should resume. Some women have medical problems that increase the chance of getting cervical cancer. In these cases, your health care provider may recommend more frequent screening and Pap tests.  Colorectal Cancer  This type of cancer can be detected and often prevented.  Routine colorectal cancer screening usually begins at 65 years of age and continues through 65 years of age.  Your health care provider may recommend screening at an earlier age if you have risk factors for colon cancer.  Your health care provider may also recommend using home test kits to check for hidden blood in the stool.  A small camera at the end of a tube can be used to examine your colon directly (sigmoidoscopy or colonoscopy). This is done to check for the earliest forms of  colorectal cancer.  Routine screening usually begins at age 50.  Direct examination of the colon should be repeated every 5-10 years through 65 years of age. However, you may need to be screened more often if early forms of precancerous polyps or small growths are found.  Skin Cancer  Check your skin from head to toe regularly.  Tell your health care provider about any new moles or changes in moles, especially if there is a change in a mole's shape or color.  Also tell your health care provider if you have a mole that is larger than the size of a pencil eraser.  Always use sunscreen. Apply sunscreen liberally and repeatedly throughout the day.  Protect yourself by wearing long sleeves, pants, a wide-brimmed hat, and sunglasses whenever you are outside.  Heart disease, diabetes, and high blood pressure  High blood pressure causes heart disease and increases the risk of stroke. High blood pressure is more likely to develop in: ? People who have blood pressure   in the high end of the normal range (130-139/85-89 mm Hg). ? People who are overweight or obese. ? People who are African American.  If you are 28-40 years of age, have your blood pressure checked every 3-5 years. If you are 66 years of age or older, have your blood pressure checked every year. You should have your blood pressure measured twice-once when you are at a hospital or clinic, and once when you are not at a hospital or clinic. Record the average of the two measurements. To check your blood pressure when you are not at a hospital or clinic, you can use: ? An automated blood pressure machine at a pharmacy. ? A home blood pressure monitor.  If you are between 22 years and 24 years old, ask your health care provider if you should take aspirin to prevent strokes.  Have regular diabetes screenings. This involves taking a blood sample to check your fasting blood sugar level. ? If you are at a normal weight and have a low risk  for diabetes, have this test once every three years after 65 years of age. ? If you are overweight and have a high risk for diabetes, consider being tested at a younger age or more often. Preventing infection Hepatitis B  If you have a higher risk for hepatitis B, you should be screened for this virus. You are considered at high risk for hepatitis B if: ? You were born in a country where hepatitis B is common. Ask your health care provider which countries are considered high risk. ? Your parents were born in a high-risk country, and you have not been immunized against hepatitis B (hepatitis B vaccine). ? You have HIV or AIDS. ? You use needles to inject street drugs. ? You live with someone who has hepatitis B. ? You have had sex with someone who has hepatitis B. ? You get hemodialysis treatment. ? You take certain medicines for conditions, including cancer, organ transplantation, and autoimmune conditions.  Hepatitis C  Blood testing is recommended for: ? Everyone born from [birth year] through 1964. ? Anyone with known risk factors for hepatitis C.  Sexually transmitted infections (STIs)  You should be screened for sexually transmitted infections (STIs) including gonorrhea and chlamydia if: ? You are sexually active and are younger than 65 years of age. ? You are older than 65 years of age and your health care provider tells you that you are at risk for this type of infection. ? Your sexual activity has changed since you were last screened and you are at an increased risk for chlamydia or gonorrhea. Ask your health care provider if you are at risk.  If you do not have HIV, but are at risk, it may be recommended that you take a prescription medicine daily to prevent HIV infection. This is called pre-exposure prophylaxis (PrEP). You are considered at risk if: ? You are sexually active and do not regularly use condoms or know the HIV status of your partner(s). ? You take drugs by  injection. ? You are sexually active with a partner who has HIV.  Talk with your health care provider about whether you are at high risk of being infected with HIV. If you choose to begin PrEP, you should first be tested for HIV. You should then be tested every 3 months for as long as you are taking PrEP. Pregnancy  If you are premenopausal and you may become pregnant, ask your health care provider about preconception counseling.  If you may become pregnant, take 400 to 800 micrograms (mcg) of folic acid every day.  If you want to prevent pregnancy, talk to your health care provider about birth control (contraception). Osteoporosis and menopause  Osteoporosis is a disease in which the bones lose minerals and strength with aging. This can result in serious bone fractures. Your risk for osteoporosis can be identified using a bone density scan.  If you are 17 years of age or older, or if you are at risk for osteoporosis and fractures, ask your health care provider if you should be screened.  Ask your health care provider whether you should take a calcium or vitamin D supplement to lower your risk for osteoporosis.  Menopause may have certain physical symptoms and risks.  Hormone replacement therapy may reduce some of these symptoms and risks. Talk to your health care provider about whether hormone replacement therapy is right for you. Follow these instructions at home:  Schedule regular health, dental, and eye exams.  Stay current with your immunizations.  Do not use any tobacco products including cigarettes, chewing tobacco, or electronic cigarettes.  If you are pregnant, do not drink alcohol.  If you are breastfeeding, limit how much and how often you drink alcohol.  Limit alcohol intake to no more than 1 drink per day for nonpregnant women. One drink equals 12 ounces of beer, 5 ounces of wine, or 1 ounces of hard liquor.  Do not use street drugs.  Do not share needles.  Ask  your health care provider for help if you need support or information about quitting drugs.  Tell your health care provider if you often feel depressed.  Tell your health care provider if you have ever been abused or do not feel safe at home. This information is not intended to replace advice given to you by your health care provider. Make sure you discuss any questions you have with your health care provider. Document Released: 05/17/2011 Document Revised: 04/08/2016 Document Reviewed: 08/05/2015 Elsevier Interactive Patient Education  [employer].

## 2017-07-11 NOTE — Progress Notes (Signed)
HPI  Patient presents today here for annual physical exam, also some acute complaints.  Patient is willing to get a colonoscopy, Pap smear. She would like referrals for these.  Shortness of breath Started after her back pain, states that she has shortness of breath with less exercise than she used to. She denies any chest pain, palpitations, or severe shortness of breath Patient states that she can typically make it 1700 steps and then has shortness of breath or back pain. Naproxen is helping back pain. She also uses 1 Aleve at times instead.  Good medication compliance overall.  She reports that she is here to get back to exercising, previous to the compression fracture she was exercising regularly.  PMH: Smoking status noted ROS: Per HPI  Objective: BP (!) 148/93   Pulse 87   Temp (!) 97 F (36.1 C) (Oral)   Ht '5\' 7"'  (1.702 m)   Wt 229 lb 6.4 oz (104.1 kg)   SpO2 97%   BMI 35.93 kg/m  Gen: NAD, alert, cooperative with exam HEENT: NCAT, EOMI, PERRL, oropharynx moist and clear, TMs normal bilaterally, nares clear CV: RRR, good S1/S2, no murmur Resp: CTABL, no wheezes, non-labored Abd: SNTND, BS present, no guarding or organomegaly Ext: No edema, warm Neuro: Alert and oriented, strength 5/5 in bilateral lower extremity with 2+ patellar tendon reflexes  Assessment and plan:  # Annual physical exam Normal exam except for obesity Basic labs collected, cholesterol is up-to-date BMP redrawn with previous hyperkalemia, lisinopril was stopped GI referral for colonoscopy, GYN referral for Pap smear, she is 65 currently and may actually be 66 by the time she is referred  # Hypertension Well-controlled with at-home blood pressure log showing blood pressures mostly 120s over 70s and 80s No changes Continue current meds Lisinopril was stopped due to hyperkalemia  # Dyspnea on exertion Likely deconditioning Patient with history of mild AS Chest x-ray, BNP  #  Osteoporosis With vertebral fracture, no DEXA scan is available currently Start Fosamax, order DEXA scan    Orders Placed This Encounter  Procedures  . DG Chest 2 View    Standing Status:   Future    Number of Occurrences:   1    Standing Expiration Date:   09/10/2018    Order Specific Question:   Reason for Exam (SYMPTOM  OR DIAGNOSIS REQUIRED)    Answer:   dyspnea    Order Specific Question:   Preferred imaging location?    Answer:   Internal    Order Specific Question:   Radiology Contrast Protocol - do NOT remove file path    Answer:   [email or web address]  . DG [facility] DEXA    Order Specific Question:   Reason for Exam (SYMPTOM  OR DIAGNOSIS REQUIRED)    Answer:   screeening  . Brain natriuretic peptide  . BMP8+EGFR  . TSH  . Ambulatory referral to Gastroenterology    Referral Priority:   Routine    Referral Type:   Consultation    Referral Reason:   Specialty Services Required    Number of Visits Requested:   1  . Ambulatory referral to Obstetrics / Gynecology    Referral Priority:   Routine    Referral Type:   Consultation    Referral Reason:   Specialty Services Required    Requested Specialty:   Obstetrics and Gynecology    Number of Visits Requested:   1    Meds ordered this encounter  Medications  . DISCONTD:  naproxen (NAPROSYN) 500 MG tablet    Sig: Take 1 tablet by mouth 2 (two) times daily.    Refill:  1  . naproxen (NAPROSYN) 500 MG tablet    Sig: Take 1 tablet (500 mg total) by mouth 2 (two) times daily.    Dispense:  60 tablet    Refill:  1  . alendronate (FOSAMAX) 70 MG tablet    Sig: Take 1 tablet (70 mg total) by mouth every 7 (seven) days. Take with a full glass of water on an empty stomach.    Dispense:  12 tablet    Refill:  [place], MD [place] 07/11/2017, 12:00 PM

## 2017-07-12 ENCOUNTER — Encounter: Payer: Self-pay | Admitting: *Deleted

## 2017-07-12 LAB — BRAIN NATRIURETIC PEPTIDE: BNP: 18.7 pg/mL (ref 0.0–100.0)

## 2017-07-12 LAB — BMP8+EGFR
BUN/Creatinine Ratio: 11 — ABNORMAL LOW (ref 12–28)
BUN: 11 mg/dL (ref 8–27)
CHLORIDE: 91 mmol/L — AB (ref 96–106)
CO2: 25 mmol/L (ref 20–29)
Calcium: 9.9 mg/dL (ref 8.7–10.3)
Creatinine, Ser: 0.99 mg/dL (ref 0.57–1.00)
GFR, EST AFRICAN AMERICAN: 70 mL/min/{1.73_m2} (ref >59)
GFR, EST NON AFRICAN AMERICAN: 60 mL/min/{1.73_m2} (ref >59)
Glucose: 133 mg/dL — ABNORMAL HIGH (ref 65–99)
POTASSIUM: 5.4 mmol/L — AB (ref 3.5–5.2)
SODIUM: 133 mmol/L — AB (ref 134–144)

## 2017-07-12 LAB — TSH: TSH: 2.92 u[IU]/mL (ref 0.450–4.500)

## 2017-07-18 ENCOUNTER — Other Ambulatory Visit: Payer: Self-pay | Admitting: Family Medicine

## 2017-07-25 ENCOUNTER — Ambulatory Visit (INDEPENDENT_AMBULATORY_CARE_PROVIDER_SITE_OTHER): Payer: Commercial Managed Care - PPO | Admitting: Orthopedic Surgery

## 2017-07-25 ENCOUNTER — Encounter: Payer: Self-pay | Admitting: Orthopedic Surgery

## 2017-07-25 VITALS — BP 156/100 | HR 91 | Ht 67.0 in | Wt 227.0 lb

## 2017-07-25 DIAGNOSIS — M546 Pain in thoracic spine: Secondary | ICD-10-CM | POA: Diagnosis not present

## 2017-07-25 NOTE — Progress Notes (Signed)
  Patient evaluation for thoracic back pain. The patient did some increased activity in June had an x-ray after reporting pain showed a mild anterior vertebral body fracture mid thoracic spine. She went for therapy and now comes in today complaining of no pain although initially she had moderate dull aching pain in her upper thoracic spine. Again that has now resolved.  Review of Systems  Constitutional: Negative for chills, fever and weight loss.  Respiratory: Negative for shortness of breath.   Cardiovascular: Negative for chest pain.  Neurological: Negative for tingling.   Past Medical History:  Diagnosis Date  . Diabetes mellitus without complication ([place])   . Hyperlipidemia   . Hypertension   . Palpitations   . Vertigo    BP (!) 156/100   Pulse 91   Ht 5\' 7"  (1.702 m)   Wt 227 lb (103 kg)   BMI 35.55 kg/m   General appearance normal body habitus Oriented 3 Mood affect normal Gait normal No tenderness in the thoracic spine mild gentle kyphosis when viewed from the side. She has normal range of motion  Her lower extremities and upper extremities have normal range of motion stability strength alignment skin and neurovascular exam is intact  X-ray dated 04/25/2017 shows a mid thoracic vertebral body wedge fracture. Report read as the same.  Patient is now asymptomatic  Encounter Diagnosis  Name Primary?  . Acute midline thoracic back pain Yes     Patient can be released to normal activity

## 2017-09-01 DIAGNOSIS — R69 Illness, unspecified: Secondary | ICD-10-CM | POA: Diagnosis not present

## 2017-10-10 ENCOUNTER — Other Ambulatory Visit: Payer: Self-pay | Admitting: *Deleted

## 2017-10-10 MED ORDER — HYDROCHLOROTHIAZIDE 25 MG PO TABS: 25.0000 mg | ORAL_TABLET | Freq: Every day | ORAL | 1 refills | Status: DC

## 2017-10-19 ENCOUNTER — Other Ambulatory Visit: Payer: Commercial Managed Care - PPO | Admitting: Obstetrics and Gynecology

## 2017-11-10 ENCOUNTER — Other Ambulatory Visit: Payer: Self-pay | Admitting: *Deleted

## 2017-11-10 DIAGNOSIS — E119 Type 2 diabetes mellitus without complications: Secondary | ICD-10-CM

## 2017-11-10 MED ORDER — SITAGLIPTIN PHOS-METFORMIN HCL 50-1000 MG PO TABS: 1.0000 | ORAL_TABLET | Freq: Two times a day (BID) | ORAL | 1 refills | Status: DC

## 2017-12-20 ENCOUNTER — Other Ambulatory Visit: Payer: Self-pay | Admitting: Nurse Practitioner

## 2017-12-20 DIAGNOSIS — E119 Type 2 diabetes mellitus without complications: Secondary | ICD-10-CM

## 2017-12-21 ENCOUNTER — Other Ambulatory Visit: Payer: Self-pay | Admitting: Nurse Practitioner

## 2017-12-21 DIAGNOSIS — Z1231 Encounter for screening mammogram for malignant neoplasm of breast: Secondary | ICD-10-CM

## 2017-12-23 ENCOUNTER — Other Ambulatory Visit: Payer: Self-pay | Admitting: Nurse Practitioner

## 2017-12-23 DIAGNOSIS — E119 Type 2 diabetes mellitus without complications: Secondary | ICD-10-CM

## 2017-12-27 ENCOUNTER — Encounter: Payer: Self-pay | Admitting: Family Medicine

## 2017-12-27 ENCOUNTER — Ambulatory Visit (INDEPENDENT_AMBULATORY_CARE_PROVIDER_SITE_OTHER): Payer: Medicare HMO | Admitting: Family Medicine

## 2017-12-27 VITALS — BP 158/91 | HR 91 | Temp 97.4°F | Ht 67.0 in | Wt 226.8 lb

## 2017-12-27 DIAGNOSIS — E119 Type 2 diabetes mellitus without complications: Secondary | ICD-10-CM | POA: Diagnosis not present

## 2017-12-27 DIAGNOSIS — M8000XD Age-related osteoporosis with current pathological fracture, unspecified site, subsequent encounter for fracture with routine healing: Secondary | ICD-10-CM | POA: Diagnosis not present

## 2017-12-27 DIAGNOSIS — I10 Essential (primary) hypertension: Secondary | ICD-10-CM

## 2017-12-27 DIAGNOSIS — M810 Age-related osteoporosis without current pathological fracture: Secondary | ICD-10-CM | POA: Insufficient documentation

## 2017-12-27 LAB — BAYER DCA HB A1C WAIVED: HB A1C: 7.8 % — AB (ref <7.0)

## 2017-12-27 MED ORDER — IBANDRONATE SODIUM 150 MG PO TABS: 150.0000 mg | ORAL_TABLET | ORAL | 3 refills | Status: DC

## 2017-12-27 MED ORDER — HYDROCHLOROTHIAZIDE 25 MG PO TABS: 25.0000 mg | ORAL_TABLET | Freq: Every day | ORAL | 3 refills | Status: DC

## 2017-12-27 MED ORDER — GLIPIZIDE ER 10 MG PO TB24: ORAL_TABLET | ORAL | 3 refills | Status: DC

## 2017-12-27 MED ORDER — SITAGLIPTIN PHOS-METFORMIN HCL 50-1000 MG PO TABS: 1.0000 | ORAL_TABLET | Freq: Two times a day (BID) | ORAL | 3 refills | Status: DC

## 2017-12-27 NOTE — Patient Instructions (Signed)
Great to see you!   

## 2017-12-27 NOTE — Progress Notes (Signed)
   HPI  Patient presents today for follow-up chronic medical conditions.  She feels that Fosamax may be causing high blood sugars.  She would like to change to something else if possible.  Diabetes Good medication compliance, no hypoglycemia Average fasting blood sugar 150-175.  Hypertension Good medication compliance No headaches or chest pain  PMH: Smoking status noted ROS: Per HPI  Objective: BP (!) 158/91   Pulse 91   Temp (!) 97.4 F (36.3 C) (Oral)   Ht _0  (1.702 m)   Wt 226 lb 12.8 oz (102.9 kg)   BMI 35.52 kg/m  Gen: NAD, alert, cooperative with exam HEENT: NCAT CV: RRR, good S1/S2, no murmur Resp: CTABL, no wheezes, non-labored Ext: No edema, warm Neuro: Alert and oriented, No gross deficits  Assessment and plan:  #Type 2 diabetes A1c pending, likely borderline control Follow-up 3 months No changes for now, she has had dietary discretion which she is correcting  #Hypertension Well-controlled, no changes to HCTZ Labs  #Osteoporosis Patient feels Fosamax is causing elevated blood sugars, this is not a listed side effect. Changing to [place] This Encounter  Procedures  . Bayer DCA Hb A1c Waived  . Lipid panel  . CMP14+EGFR  . CBC with Differential/Platelet    Meds ordered this encounter  Medications  . hydrochlorothiazide (HYDRODIURIL) 25 MG tablet    Sig: Take 1 tablet (25 mg total) by mouth daily.    Dispense:  90 tablet    Refill:  3  . glipiZIDE (GLUCOTROL XL) 10 MG 24 hr tablet    Sig: TAKE 1 TABLET BY MOUTH DAILY **NEEDS OFFICE VISIT FOR NEXT REFILL**    Dispense:  90 tablet    Refill:  3  . sitaGLIPtin-metformin (JANUMET) 50-1000 MG tablet    Sig: Take 1 tablet by mouth 2 (two) times daily with a meal.    Dispense:  180 tablet    Refill:  3  . ibandronate (BONIVA) 150 MG tablet    Sig: Take 1 tablet (150 mg total) by mouth every 30 (thirty) days. Take in the morning with a full glass of water, on an empty stomach,  and do not take anything else by mouth or lie down for the next 30 min.    Dispense:  3 tablet    Refill:  Charles Mix, MD [place] 12/27/2017, 10:58 AM

## 2017-12-28 ENCOUNTER — Encounter (HOSPITAL_COMMUNITY): Payer: Self-pay

## 2017-12-28 ENCOUNTER — Ambulatory Visit (HOSPITAL_COMMUNITY)
Admission: RE | Admit: 2017-12-28 | Discharge: 2017-12-28 | Disposition: A | Discharge status: Home / Self Care | Payer: Medicare HMO | Source: Ambulatory Visit | Attending: Nurse Practitioner | Admitting: Nurse Practitioner

## 2017-12-28 DIAGNOSIS — Z1231 Encounter for screening mammogram for malignant neoplasm of breast: Secondary | ICD-10-CM | POA: Diagnosis not present

## 2017-12-28 LAB — CMP14+EGFR
ALBUMIN: 4.6 g/dL (ref 3.6–4.8)
ALK PHOS: 54 IU/L (ref 39–117)
ALT: 35 IU/L — ABNORMAL HIGH (ref 0–32)
AST: 34 IU/L (ref 0–40)
Albumin/Globulin Ratio: 1.5 (ref 1.2–2.2)
BUN / CREAT RATIO: 12 (ref 12–28)
BUN: 12 mg/dL (ref 8–27)
Bilirubin Total: 0.3 mg/dL (ref 0.0–1.2)
CO2: 20 mmol/L (ref 20–29)
CREATININE: 1.03 mg/dL — AB (ref 0.57–1.00)
Calcium: 10.1 mg/dL (ref 8.7–10.3)
Chloride: 91 mmol/L — ABNORMAL LOW (ref 96–106)
GFR calc Af Amer: 66 mL/min/{1.73_m2} (ref >59)
GFR calc non Af Amer: 57 mL/min/{1.73_m2} — ABNORMAL LOW (ref >59)
GLUCOSE: 222 mg/dL — AB (ref 65–99)
Globulin, Total: 3.1 g/dL (ref 1.5–4.5)
Potassium: 5.1 mmol/L (ref 3.5–5.2)
Sodium: 135 mmol/L (ref 134–144)
Total Protein: 7.7 g/dL (ref 6.0–8.5)

## 2017-12-28 LAB — CBC WITH DIFFERENTIAL/PLATELET
BASOS ABS: 0 10*3/uL (ref 0.0–0.2)
Basos: 0 %
EOS (ABSOLUTE): 0.2 10*3/uL (ref 0.0–0.4)
EOS: 3 %
Hematocrit: 40.1 % (ref 34.0–46.6)
Hemoglobin: 12.9 g/dL (ref 11.1–15.9)
Immature Grans (Abs): 0 10*3/uL (ref 0.0–0.1)
Immature Granulocytes: 0 %
LYMPHS ABS: 2.3 10*3/uL (ref 0.7–3.1)
Lymphs: 29 %
MCH: 25.3 pg — AB (ref 26.6–33.0)
MCHC: 32.2 g/dL (ref 31.5–35.7)
MCV: 79 fL (ref 79–97)
Monocytes Absolute: 0.6 10*3/uL (ref 0.1–0.9)
Monocytes: 7 %
NEUTROS PCT: 61 %
Neutrophils Absolute: 5 10*3/uL (ref 1.4–7.0)
PLATELETS: 441 10*3/uL — AB (ref 150–379)
RBC: 5.09 x10E6/uL (ref 3.77–5.28)
RDW: 14.8 % (ref 12.3–15.4)
WBC: 8.2 10*3/uL (ref 3.4–10.8)

## 2017-12-28 LAB — LIPID PANEL
CHOLESTEROL TOTAL: 199 mg/dL (ref 100–199)
Chol/HDL Ratio: 4 ratio (ref 0.0–4.4)
HDL: 50 mg/dL (ref >39)
LDL CALC: 117 mg/dL — AB (ref 0–99)
Triglycerides: 158 mg/dL — ABNORMAL HIGH (ref 0–149)
VLDL Cholesterol Cal: 32 mg/dL (ref 5–40)

## 2018-01-24 DIAGNOSIS — H35033 Hypertensive retinopathy, bilateral: Secondary | ICD-10-CM | POA: Diagnosis not present

## 2018-01-24 DIAGNOSIS — H524 Presbyopia: Secondary | ICD-10-CM | POA: Diagnosis not present

## 2018-01-24 LAB — HM DIABETES EYE EXAM

## 2018-03-02 ENCOUNTER — Telehealth: Payer: Self-pay

## 2018-03-02 MED ORDER — BLOOD GLUCOSE METER KIT: PACK | 0 refills | Status: DC

## 2018-03-02 NOTE — Addendum Note (Signed)
Addended by: Karle Plumber on: 03/02/2018 02:13 PM   Modules accepted: Orders

## 2018-03-02 NOTE — Telephone Encounter (Signed)
My BS machine broken  I need a new one sent into the pharmacy  [facility]

## 2018-03-02 NOTE — Telephone Encounter (Signed)
OK kwith new glucometer, will ask nursing to discuss and send appropriate brand.   Laroy Apple, MD [facility] 03/02/2018, 11:21 AM

## 2018-03-02 NOTE — Telephone Encounter (Signed)
lmtcb

## 2018-03-02 NOTE — Telephone Encounter (Signed)
Rx done and [facility]

## 2018-03-07 ENCOUNTER — Other Ambulatory Visit: Payer: Self-pay | Admitting: *Deleted

## 2018-03-07 ENCOUNTER — Telehealth: Payer: Self-pay | Admitting: Family Medicine

## 2018-03-07 DIAGNOSIS — R69 Illness, unspecified: Secondary | ICD-10-CM | POA: Diagnosis not present

## 2018-03-07 MED ORDER — BLOOD GLUCOSE METER KIT: PACK | 0 refills | Status: DC

## 2018-03-07 NOTE — Telephone Encounter (Signed)
Glucometer Rx sent to pharmacy

## 2018-03-07 NOTE — Telephone Encounter (Signed)
[facility] [place]

## 2018-03-18 ENCOUNTER — Other Ambulatory Visit: Payer: Self-pay | Admitting: Nurse Practitioner

## 2018-03-27 ENCOUNTER — Other Ambulatory Visit: Payer: Self-pay | Admitting: Nurse Practitioner

## 2018-03-27 ENCOUNTER — Ambulatory Visit: Payer: Medicare HMO | Admitting: Family Medicine

## 2018-04-02 ENCOUNTER — Other Ambulatory Visit: Payer: Self-pay | Admitting: Nurse Practitioner

## 2018-04-03 NOTE — Telephone Encounter (Signed)
Last Vit D 04/18/17  37.0

## 2018-05-01 DIAGNOSIS — H2513 Age-related nuclear cataract, bilateral: Secondary | ICD-10-CM | POA: Diagnosis not present

## 2018-05-01 DIAGNOSIS — H40012 Open angle with borderline findings, low risk, left eye: Secondary | ICD-10-CM | POA: Diagnosis not present

## 2018-05-01 DIAGNOSIS — Z83511 Family history of glaucoma: Secondary | ICD-10-CM | POA: Diagnosis not present

## 2018-05-01 DIAGNOSIS — H5030 Unspecified intermittent heterotropia: Secondary | ICD-10-CM | POA: Diagnosis not present

## 2018-05-01 DIAGNOSIS — H40011 Open angle with borderline findings, low risk, right eye: Secondary | ICD-10-CM | POA: Diagnosis not present

## 2018-05-01 DIAGNOSIS — H40013 Open angle with borderline findings, low risk, bilateral: Secondary | ICD-10-CM | POA: Diagnosis not present

## 2018-05-24 DIAGNOSIS — D2262 Melanocytic nevi of left upper limb, including shoulder: Secondary | ICD-10-CM | POA: Diagnosis not present

## 2018-05-24 DIAGNOSIS — D485 Neoplasm of uncertain behavior of skin: Secondary | ICD-10-CM | POA: Diagnosis not present

## 2018-05-24 DIAGNOSIS — L570 Actinic keratosis: Secondary | ICD-10-CM | POA: Diagnosis not present

## 2018-05-24 DIAGNOSIS — L821 Other seborrheic keratosis: Secondary | ICD-10-CM | POA: Diagnosis not present

## 2018-05-24 DIAGNOSIS — Z85828 Personal history of other malignant neoplasm of skin: Secondary | ICD-10-CM | POA: Diagnosis not present

## 2018-05-30 ENCOUNTER — Other Ambulatory Visit: Payer: Self-pay | Admitting: Family Medicine

## 2018-05-30 DIAGNOSIS — R69 Illness, unspecified: Secondary | ICD-10-CM | POA: Diagnosis not present

## 2018-06-26 ENCOUNTER — Other Ambulatory Visit: Payer: Self-pay

## 2018-06-26 MED ORDER — VITAMIN D3 1.25 MG (50000 UT) PO CAPS: ORAL_CAPSULE | ORAL | 0 refills | Status: DC

## 2018-06-26 NOTE — Telephone Encounter (Signed)
Last Vit D Dr Wendi Snipes 04/15/17  37.0

## 2018-08-24 ENCOUNTER — Ambulatory Visit (INDEPENDENT_AMBULATORY_CARE_PROVIDER_SITE_OTHER): Payer: Medicare HMO

## 2018-08-24 DIAGNOSIS — Z23 Encounter for immunization: Secondary | ICD-10-CM

## 2018-08-26 DIAGNOSIS — R69 Illness, unspecified: Secondary | ICD-10-CM | POA: Diagnosis not present

## 2018-09-13 ENCOUNTER — Other Ambulatory Visit: Payer: Self-pay | Admitting: Family Medicine

## 2018-09-26 ENCOUNTER — Ambulatory Visit: Payer: Medicare HMO | Admitting: Family Medicine

## 2018-10-17 ENCOUNTER — Encounter: Payer: Self-pay | Admitting: Family Medicine

## 2018-10-17 ENCOUNTER — Ambulatory Visit (INDEPENDENT_AMBULATORY_CARE_PROVIDER_SITE_OTHER): Payer: Medicare HMO | Admitting: Family Medicine

## 2018-10-17 VITALS — BP 158/80 | HR 90 | Temp 97.9°F | Ht 67.0 in | Wt 230.0 lb

## 2018-10-17 DIAGNOSIS — R69 Illness, unspecified: Secondary | ICD-10-CM | POA: Diagnosis not present

## 2018-10-17 DIAGNOSIS — E119 Type 2 diabetes mellitus without complications: Secondary | ICD-10-CM | POA: Diagnosis not present

## 2018-10-17 DIAGNOSIS — I10 Essential (primary) hypertension: Secondary | ICD-10-CM | POA: Diagnosis not present

## 2018-10-17 DIAGNOSIS — Z23 Encounter for immunization: Secondary | ICD-10-CM | POA: Diagnosis not present

## 2018-10-17 DIAGNOSIS — I152 Hypertension secondary to endocrine disorders: Secondary | ICD-10-CM

## 2018-10-17 DIAGNOSIS — F411 Generalized anxiety disorder: Secondary | ICD-10-CM | POA: Diagnosis not present

## 2018-10-17 DIAGNOSIS — M810 Age-related osteoporosis without current pathological fracture: Secondary | ICD-10-CM | POA: Diagnosis not present

## 2018-10-17 DIAGNOSIS — E1169 Type 2 diabetes mellitus with other specified complication: Secondary | ICD-10-CM | POA: Diagnosis not present

## 2018-10-17 DIAGNOSIS — M5442 Lumbago with sciatica, left side: Secondary | ICD-10-CM

## 2018-10-17 DIAGNOSIS — E1159 Type 2 diabetes mellitus with other circulatory complications: Secondary | ICD-10-CM | POA: Diagnosis not present

## 2018-10-17 DIAGNOSIS — Z0001 Encounter for general adult medical examination with abnormal findings: Secondary | ICD-10-CM

## 2018-10-17 DIAGNOSIS — Z1211 Encounter for screening for malignant neoplasm of colon: Secondary | ICD-10-CM | POA: Diagnosis not present

## 2018-10-17 DIAGNOSIS — Z0000 Encounter for general adult medical examination without abnormal findings: Secondary | ICD-10-CM

## 2018-10-17 DIAGNOSIS — E785 Hyperlipidemia, unspecified: Secondary | ICD-10-CM | POA: Diagnosis not present

## 2018-10-17 DIAGNOSIS — G8929 Other chronic pain: Secondary | ICD-10-CM

## 2018-10-17 LAB — BAYER DCA HB A1C WAIVED: HB A1C (BAYER DCA - WAIVED): 8 % — ABNORMAL HIGH (ref <7.0)

## 2018-10-17 MED ORDER — GLIPIZIDE ER 10 MG PO TB24: ORAL_TABLET | ORAL | 3 refills | Status: DC

## 2018-10-17 MED ORDER — HYDROCHLOROTHIAZIDE 25 MG PO TABS: 25.0000 mg | ORAL_TABLET | Freq: Every day | ORAL | 3 refills | Status: DC

## 2018-10-17 MED ORDER — BUSPIRONE HCL 15 MG PO TABS: ORAL_TABLET | ORAL | 1 refills | Status: DC

## 2018-10-17 MED ORDER — SITAGLIP PHOS-METFORMIN HCL ER 100-1000 MG PO TB24: 1.0000 | ORAL_TABLET | Freq: Every day | ORAL | 1 refills | Status: DC

## 2018-10-17 MED ORDER — IBANDRONATE SODIUM 150 MG PO TABS
ORAL_TABLET | ORAL | 3 refills | Status: DC
Start: 2018-10-17 — End: 2019-05-28

## 2018-10-17 MED ORDER — CYCLOBENZAPRINE HCL 5 MG PO TABS: ORAL_TABLET | ORAL | 1 refills | Status: DC

## 2018-10-17 MED ORDER — AMLODIPINE BESYLATE 5 MG PO TABS: 5.0000 mg | ORAL_TABLET | Freq: Every day | ORAL | 0 refills | Status: DC

## 2018-10-17 NOTE — Patient Instructions (Addendum)
To do list:  Continue to check your blood pressure daily and record  We have changed your blood pressure medication today.  Come back in 1 week to have it checked with the nurse.    See me in 6 weeks for your blood pressure, sooner if you have concerns.  Your GOAL Blood pressure is less than 150/90. Ideally, around 120/80.  Your A1c was 8 today.  This is uncontrolled diabetes.  I have changed your Janumet to the extended release and increased the Januvia portion to '100mg'$  daily.  We will see eachother in 3 months to recheck this.  Health Maintenance, Female Adopting a healthy lifestyle and getting preventive care can go a long way to promote health and wellness. Talk with your health care provider about what schedule of regular examinations is right for you. This is a good chance for you to check in with your provider about disease prevention and staying healthy. In between checkups, there are plenty of things you can do on your own. Experts have done a lot of research about which lifestyle changes and preventive measures are most likely to keep you healthy. Ask your health care provider for more information. Weight and diet Eat a healthy diet  Be sure to include plenty of vegetables, fruits, low-fat dairy products, and lean protein.  Do not eat a lot of foods high in solid fats, added sugars, or salt.  Get regular exercise. This is one of the most important things you can do for your health. ? Most adults should exercise for at least 150 minutes each week. The exercise should increase your heart rate and make you sweat (moderate-intensity exercise). ? Most adults should also do strengthening exercises at least twice a week. This is in addition to the moderate-intensity exercise.  Maintain a healthy weight  Body mass index (BMI) is a measurement that can be used to identify possible weight problems. It estimates body fat based on height and weight. Your health care provider can help  determine your BMI and help you achieve or maintain a healthy weight.  For females 65 years of age and older: ? A BMI below 18.5 is considered underweight. ? A BMI of 18.5 to 24.9 is normal. ? A BMI of 25 to 29.9 is considered overweight. ? A BMI of 30 and above is considered obese.  Watch levels of cholesterol and blood lipids  You should start having your blood tested for lipids and cholesterol at 66 years of age, then have this test every 5 years.  You may need to have your cholesterol levels checked more often if: ? Your lipid or cholesterol levels are high. ? You are older than 66 years of age. ? You are at high risk for heart disease.  Cancer screening Lung Cancer  Lung cancer screening is recommended for adults 89-100 years old who are at high risk for lung cancer because of a history of smoking.  A yearly low-dose CT scan of the lungs is recommended for people who: ? Currently smoke. ? Have quit within the past 15 years. ? Have at least a 30-pack-year history of smoking. A pack year is smoking an average of one pack of cigarettes a day for 1 year.  Yearly screening should continue until it has been 15 years since you quit.  Yearly screening should stop if you develop a health problem that would prevent you from having lung cancer treatment.  Breast Cancer  Practice breast self-awareness. This means understanding how your  breasts normally appear and feel.  It also means doing regular breast self-exams. Let your health care provider know about any changes, no matter how small.  If you are in your 20s or 30s, you should have a clinical breast exam (CBE) by a health care provider every 1-3 years as part of a regular health exam.  If you are 52 or older, have a CBE every year. Also consider having a breast X-ray (mammogram) every year.  If you have a family history of breast cancer, talk to your health care provider about genetic screening.  If you are at high risk for  breast cancer, talk to your health care provider about having an MRI and a mammogram every year.  Breast cancer gene (BRCA) assessment is recommended for women who have family members with BRCA-related cancers. BRCA-related cancers include: ? Breast. ? Ovarian. ? Tubal. ? Peritoneal cancers.  Results of the assessment will determine the need for genetic counseling and BRCA1 and BRCA2 testing.  Cervical Cancer Your health care provider may recommend that you be screened regularly for cancer of the pelvic organs (ovaries, uterus, and vagina). This screening involves a pelvic examination, including checking for microscopic changes to the surface of your cervix (Pap test). You may be encouraged to have this screening done every 3 years, beginning at age 63.  For women ages 54-65, health care providers may recommend pelvic exams and Pap testing every 3 years, or they may recommend the Pap and pelvic exam, combined with testing for human papilloma virus (HPV), every 5 years. Some types of HPV increase your risk of cervical cancer. Testing for HPV may also be done on women of any age with unclear Pap test results.  Other health care providers may not recommend any screening for nonpregnant women who are considered low risk for pelvic cancer and who do not have symptoms. Ask your health care provider if a screening pelvic exam is right for you.  If you have had past treatment for cervical cancer or a condition that could lead to cancer, you need Pap tests and screening for cancer for at least 20 years after your treatment. If Pap tests have been discontinued, your risk factors (such as having a new sexual partner) need to be reassessed to determine if screening should resume. Some women have medical problems that increase the chance of getting cervical cancer. In these cases, your health care provider may recommend more frequent screening and Pap tests.  Colorectal Cancer  This type of cancer can be  detected and often prevented.  Routine colorectal cancer screening usually begins at 66 years of age and continues through 66 years of age.  Your health care provider may recommend screening at an earlier age if you have risk factors for colon cancer.  Your health care provider may also recommend using home test kits to check for hidden blood in the stool.  A small camera at the end of a tube can be used to examine your colon directly (sigmoidoscopy or colonoscopy). This is done to check for the earliest forms of colorectal cancer.  Routine screening usually begins at age 13.  Direct examination of the colon should be repeated every 5-10 years through 66 years of age. However, you may need to be screened more often if early forms of precancerous polyps or small growths are found.  Skin Cancer  Check your skin from head to toe regularly.  Tell your health care provider about any new moles or changes in  moles, especially if there is a change in a mole's shape or color.  Also tell your health care provider if you have a mole that is larger than the size of a pencil eraser.  Always use sunscreen. Apply sunscreen liberally and repeatedly throughout the day.  Protect yourself by wearing long sleeves, pants, a wide-brimmed hat, and sunglasses whenever you are outside.  Heart disease, diabetes, and high blood pressure  High blood pressure causes heart disease and increases the risk of stroke. High blood pressure is more likely to develop in: ? People who have blood pressure in the high end of the normal range (130-139/85-89 mm Hg). ? People who are overweight or obese. ? People who are African American.  If you are 39-16 years of age, have your blood pressure checked every 3-5 years. If you are 59 years of age or older, have your blood pressure checked every year. You should have your blood pressure measured twice-once when you are at a hospital or clinic, and once when you are not at a  hospital or clinic. Record the average of the two measurements. To check your blood pressure when you are not at a hospital or clinic, you can use: ? An automated blood pressure machine at a pharmacy. ? A home blood pressure monitor.  If you are between 59 years and 37 years old, ask your health care provider if you should take aspirin to prevent strokes.  Have regular diabetes screenings. This involves taking a blood sample to check your fasting blood sugar level. ? If you are at a normal weight and have a low risk for diabetes, have this test once every three years after 66 years of age. ? If you are overweight and have a high risk for diabetes, consider being tested at a younger age or more often. Preventing infection Hepatitis B  If you have a higher risk for hepatitis B, you should be screened for this virus. You are considered at high risk for hepatitis B if: ? You were born in a country where hepatitis B is common. Ask your health care provider which countries are considered high risk. ? Your parents were born in a high-risk country, and you have not been immunized against hepatitis B (hepatitis B vaccine). ? You have HIV or AIDS. ? You use needles to inject street drugs. ? You live with someone who has hepatitis B. ? You have had sex with someone who has hepatitis B. ? You get hemodialysis treatment. ? You take certain medicines for conditions, including cancer, organ transplantation, and autoimmune conditions.  Hepatitis C  Blood testing is recommended for: ? Everyone born from [birth year] through 1964. ? Anyone with known risk factors for hepatitis C.  Sexually transmitted infections (STIs)  You should be screened for sexually transmitted infections (STIs) including gonorrhea and chlamydia if: ? You are sexually active and are younger than 66 years of age. ? You are older than 66 years of age and your health care provider tells you that you are at risk for this type of  infection. ? Your sexual activity has changed since you were last screened and you are at an increased risk for chlamydia or gonorrhea. Ask your health care provider if you are at risk.  If you do not have HIV, but are at risk, it may be recommended that you take a prescription medicine daily to prevent HIV infection. This is called pre-exposure prophylaxis (PrEP). You are considered at risk if: ? You are  sexually active and do not regularly use condoms or know the HIV status of your partner(s). ? You take drugs by injection. ? You are sexually active with a partner who has HIV.  Talk with your health care provider about whether you are at high risk of being infected with HIV. If you choose to begin PrEP, you should first be tested for HIV. You should then be tested every 3 months for as long as you are taking PrEP. Pregnancy  If you are premenopausal and you may become pregnant, ask your health care provider about preconception counseling.  If you may become pregnant, take 400 to 800 micrograms (mcg) of folic acid every day.  If you want to prevent pregnancy, talk to your health care provider about birth control (contraception). Osteoporosis and menopause  Osteoporosis is a disease in which the bones lose minerals and strength with aging. This can result in serious bone fractures. Your risk for osteoporosis can be identified using a bone density scan.  If you are 35 years of age or older, or if you are at risk for osteoporosis and fractures, ask your health care provider if you should be screened.  Ask your health care provider whether you should take a calcium or vitamin D supplement to lower your risk for osteoporosis.  Menopause may have certain physical symptoms and risks.  Hormone replacement therapy may reduce some of these symptoms and risks. Talk to your health care provider about whether hormone replacement therapy is right for you. Follow these instructions at home:  Schedule  regular health, dental, and eye exams.  Stay current with your immunizations.  Do not use any tobacco products including cigarettes, chewing tobacco, or electronic cigarettes.  If you are pregnant, do not drink alcohol.  If you are breastfeeding, limit how much and how often you drink alcohol.  Limit alcohol intake to no more than 1 drink per day for nonpregnant women. One drink equals 12 ounces of beer, 5 ounces of wine, or 1 ounces of hard liquor.  Do not use street drugs.  Do not share needles.  Ask your health care provider for help if you need support or information about quitting drugs.  Tell your health care provider if you often feel depressed.  Tell your health care provider if you have ever been abused or do not feel safe at home. This information is not intended to replace advice given to you by your health care provider. Make sure you discuss any questions you have with your health care provider. Document Released: 05/17/2011 Document Revised: 04/08/2016 Document Reviewed: 08/05/2015 Elsevier Interactive Patient Education  [employer].

## 2018-10-17 NOTE — Progress Notes (Signed)
Erica Evans is a 67 y.o. female presents to office today for annual physical exam examination.    Concerns today include: 1.  Type 2 Diabetes w/ HTN and hyperlipidemia Patient reports taking medication(s): Janumet 50-1000 twice daily, glipizide XL 10 daily, and hydrochlorothiazide 25 mg. side effects: none.  She was unable to tolerate Lipitor in the past is not on any medications for cholesterol.  Last eye exam: UTD Last foot exam: due Last A1c: 7.8 in 12/2017 Nephropathy screen indicated?: yes Last flu, zoster and/or pneumovax: Needs PNA  ROS: denies dizziness, LOC, polyuria, polydipsia, unintended weight loss/gain, foot ulcerations, numbness or tingling in extremities.  She does report an occasional twinge in her chest that lasts a couple of seconds and is not associated with any particular activities.  She has no shortness of breath, nausea, vomiting or associated diaphoresis.  2.  Osteoporosis Patient reports a diagnosis about 1 year ago.  She reports compliance with Boniva.  3.  Generalized anxiety disorder Patient reports good control with BuSpar.  No SI or HI.  4.  Chronic low back pain Patient reports chronic left-sided low back pain with sciatica.  She occasionally needs to use Flexeril for this.  She does report good relief with Flexeril, citing that it helps her to rest.  She denies any falls.  She uses the Flexeril very rarely.  Again, no numbness or tingling in the lower extremities.  Marital status: married, Substance use: none Diet: fair, Exercise: no structured Last eye exam: UTD Last colonoscopy: She has never had a colonoscopy or any colon cancer screening.  She becomes very tearful during discussion of this, citing that her brother passed away following a colonoscopy.  She has been reluctant to have this done since then.  She is interested in having Cologuard however.  Denies any rectal bleeding or family history of colon cancers. Last mammogram: She is  up-to-date Last pap smear: Greater than 41 years old.  No longer indicated.  No vaginal concerns.   Past Medical History:  Diagnosis Date  . Diabetes mellitus without complication ([place])   . Hyperlipidemia   . Hypertension   . Palpitations   . Vertigo    Social History   Socioeconomic History  . Marital status: Married    Spouse name: Not on file  . Number of children: Not on file  . Years of education: Not on file  . Highest education level: Not on file  Occupational History  . Not on file  Social Needs  . Financial resource strain: Not on file  . Food insecurity:    Worry: Not on file    Inability: Not on file  . Transportation needs:    Medical: Not on file    Non-medical: Not on file  Tobacco Use  . Smoking status: Former Research scientist (life sciences)  . Smokeless tobacco: Never Used  Substance and Sexual Activity  . Alcohol use: No  . Drug use: No  . Sexual activity: Not on file  Lifestyle  . Physical activity:    Days per week: Not on file    Minutes per session: Not on file  . Stress: Not on file  Relationships  . Social connections:    Talks on phone: Not on file    Gets together: Not on file    Attends religious service: Not on file    Active member of club or organization: Not on file    Attends meetings of clubs or organizations: Not on file  Relationship status: Not on file  . Intimate partner violence:    Fear of current or ex partner: Not on file    Emotionally abused: Not on file    Physically abused: Not on file    Forced sexual activity: Not on file  Other Topics Concern  . Not on file  Social History Narrative  . Not on file   No past surgical history on file. Family History  Problem Relation Age of Onset  . Coronary artery disease Mother   . Diabetes Mother   . Coronary artery disease Father   . Diabetes Sister     Current Outpatient Medications:  .  blood glucose meter kit and supplies, Check BS once daily, Disp: 1 each, Rfl: 0 .  busPIRone  (BUSPAR) 15 MG tablet, Take 1/2 to 1 tablet every 8 hours for nerves, Disp: 180 tablet, Rfl: 1 .  Cholecalciferol (VITAMIN D3) 50000 units CAPS, TAKE 1 CAPSULE (50,000 UNITS TOTAL) BY MOUTH EVERY 7 (SEVEN) DAYS., Disp: 12 capsule, Rfl: 0 .  cyclobenzaprine (FLEXERIL) 5 MG tablet, TAKE 1 TABLET BY MOUTH 3 TIMES A DAY AS NEEDED FOR MUSCLE SPASMS, Disp: 30 tablet, Rfl: 1 .  glipiZIDE (GLUCOTROL XL) 10 MG 24 hr tablet, TAKE 1 TABLET BY MOUTH DAILY **NEEDS OFFICE VISIT FOR NEXT REFILL**, Disp: 90 tablet, Rfl: 3 .  hydrochlorothiazide (HYDRODIURIL) 25 MG tablet, Take 1 tablet (25 mg total) by mouth daily., Disp: 90 tablet, Rfl: 3 .  ibandronate (BONIVA) 150 MG tablet, Take 1 tablet (150 mg total) by mouth every 30 (thirty) days. Take in the morning with a full glass of water, on an empty stomach, and do not take anything else by mouth or lie down for the next 30 min., Disp: 3 tablet, Rfl: 3 .  ONETOUCH VERIO test strip, USE TO TEST BLOOD SUGAR ONCE DAILY, Disp: 100 each, Rfl: 11 .  sitaGLIPtin-metformin (JANUMET) 50-1000 MG tablet, Take 1 tablet by mouth 2 (two) times daily with a meal., Disp: 180 tablet, Rfl: 3  Allergies  Allergen Reactions  . Lipitor [Atorvastatin]   . Morphine And Related      ROS: Review of Systems Constitutional: negative Eyes: negative Ears, nose, mouth, throat, and face: negative Respiratory: negative Cardiovascular: negative Gastrointestinal: negative Genitourinary:negative Integument/breast: negative Hematologic/lymphatic: negative Musculoskeletal:positive for back pain Neurological: negative Behavioral/Psych: positive for anxiety Endocrine: negative Allergic/Immunologic: negative    Physical exam BP (!) 158/80   Pulse 90   Temp 97.9 F (36.6 C) (Oral)   Ht _0  (1.702 m)   Wt 230 lb (104.3 kg)   BMI 36.02 kg/m  General appearance: alert, cooperative, appears stated age and no distress Head: Normocephalic, without obvious abnormality, atraumatic Eyes:  negative findings: lids and lashes normal, conjunctivae and sclerae normal, corneas clear and pupils equal, round, reactive to light and accomodation Ears: normal TM's and external ear canals both ears Nose: Nares normal. Septum midline. Mucosa normal. No drainage or sinus tenderness. Throat: lips, mucosa, and tongue normal; teeth and gums normal Neck: no adenopathy, no carotid bruit, supple, symmetrical, trachea midline and thyroid not enlarged, symmetric, no tenderness/mass/nodules Back: symmetric, no curvature. ROM normal. No CVA tenderness. Lungs: clear to auscultation bilaterally Breasts: deferred Heart: regular rate and rhythm, S1, S2 normal, no murmur, click, rub or gallop Abdomen: soft, non-tender; bowel sounds normal; no masses,  no organomegaly Pelvic: deferred Extremities: extremities normal, atraumatic, no cyanosis or edema Pulses: 2+ and symmetric Skin: Skin color, texture, turgor normal. No rashes or lesions Lymph  nodes: Cervical, supraclavicular, and axillary nodes normal. Neurologic: Alert and oriented X 3, normal strength and tone. Normal symmetric reflexes. Normal coordination and gait  Psych: Mood stable, speech normal, affect appropriate, pleasant and interactive.  Becomes tearful somewhat when talking about her brother  Depression screen [facility] 2/9 10/17/2018 12/27/2017 07/11/2017  Decreased Interest 1 0 1  Down, Depressed, Hopeless 0 0 1  PHQ - 2 Score 1 0 2  Altered sleeping 0 - 0  Tired, decreased energy 0 - 1  Change in appetite 0 - 1  Feeling bad or failure about yourself  0 - 0  Trouble concentrating 0 - 0  Moving slowly or fidgety/restless 0 - 0  Suicidal thoughts 0 - 0  PHQ-9 Score 1 - 4   Assessment/ Plan: [name] here for annual physical exam.   1. Annual physical exam Pneumococcal vaccine updated.  I have ordered Cologuard for colon cancer screening.    2. Hypertension associated with diabetes ([place]) Blood pressure not at goal.  Check CMP  and lipid.  Add Norvasc.  Per her report today, she had been on lisinopril in the past but was taken off of it for lip swelling.  We are checking urine microalbumin for type 2 diabetes.  She will continue to monitor blood pressures at home and follow-up with the nurse in 1 week for blood pressure recheck.  Plan to see me in the next 4 to 6 weeks for recheck here in office. - Lipid Panel - CMP14+EGFR  3. Type 2 diabetes mellitus without complication, without long-term current use of insulin (HCC) A1c is not at goal.  It is 8.0 today.  I have increased the Januvia component of her Janumet to 100 mg daily.  I have also made this extended release and reviewed with the patient this is now once daily medication.  She will follow-up in 3 months to have her A1c rechecked.  We will plan to obtain a foot exam at that visit. - Lipid Panel - CMP14+EGFR - Bayer DCA Hb A1c Waived - Microalbumin / creatinine urine ratio - glipiZIDE (GLUCOTROL XL) 10 MG 24 hr tablet; TAKE 1 TABLET BY MOUTH DAILY **NEEDS OFFICE VISIT FOR NEXT REFILL**  Dispense: 90 tablet; Refill: 3  4. Hyperlipidemia associated with type 2 diabetes mellitus (HCC) Check lipid panel.  May need to consider Zetia or alternative since intolerant to Lipitor. - Lipid Panel  5. Age-related osteoporosis without current pathological fracture Check vitamin D and calcium as above. - VITAMIN D 25 Hydroxy (Vit-D Deficiency, Fractures)  6. GAD (generalized anxiety disorder) Doing well BuSpar.  This is been refilled. - busPIRone (BUSPAR) 15 MG tablet; Take 1/2 to 1 tablet every 8 hours for nerves  Dispense: 180 tablet; Refill: 1  7. Morbid obesity (HCC) - TSH  8. Chronic left-sided low back pain with left-sided sciatica - cyclobenzaprine (FLEXERIL) 5 MG tablet; TAKE 1 TABLET BY MOUTH 3 TIMES A DAY AS NEEDED FOR MUSCLE SPASMS  Dispense: 30 tablet; Refill: 1  9. Screening for malignant neoplasm of colon We discussed that if there is a positive result  this would still result in a recommendation for a colonoscopy.  She voiced good understanding and wished to proceed with the exam. - Cologuard  Handout on healthy lifestyle choices, including diet (rich in fruits, vegetables and lean meats and low in salt and simple carbohydrates) and exercise (at least 30 minutes of moderate physical activity daily).  Patient to follow up in 1 year for  annual exam and in 1 month for BP check  Ashly M. Lajuana Ripple, DO

## 2018-10-17 NOTE — Addendum Note (Signed)
Addended byCarrolyn Leigh on: 10/17/2018 04:34 PM   Modules accepted: Orders

## 2018-10-18 LAB — LIPID PANEL
CHOLESTEROL TOTAL: 190 mg/dL (ref 100–199)
Chol/HDL Ratio: 3.7 ratio (ref 0.0–4.4)
HDL: 52 mg/dL (ref >39)
LDL Calculated: 107 mg/dL — ABNORMAL HIGH (ref 0–99)
Triglycerides: 154 mg/dL — ABNORMAL HIGH (ref 0–149)
VLDL Cholesterol Cal: 31 mg/dL (ref 5–40)

## 2018-10-18 LAB — CMP14+EGFR
ALBUMIN: 4.5 g/dL (ref 3.6–4.8)
ALT: 27 IU/L (ref 0–32)
AST: 25 IU/L (ref 0–40)
Albumin/Globulin Ratio: 1.8 (ref 1.2–2.2)
Alkaline Phosphatase: 53 IU/L (ref 39–117)
BUN/Creatinine Ratio: 11 — ABNORMAL LOW (ref 12–28)
BUN: 10 mg/dL (ref 8–27)
Bilirubin Total: 0.3 mg/dL (ref 0.0–1.2)
CHLORIDE: 92 mmol/L — AB (ref 96–106)
CO2: 22 mmol/L (ref 20–29)
CREATININE: 0.89 mg/dL (ref 0.57–1.00)
Calcium: 9.5 mg/dL (ref 8.7–10.3)
GFR, EST AFRICAN AMERICAN: 78 mL/min/{1.73_m2} (ref >59)
GFR, EST NON AFRICAN AMERICAN: 68 mL/min/{1.73_m2} (ref >59)
GLUCOSE: 184 mg/dL — AB (ref 65–99)
Globulin, Total: 2.5 g/dL (ref 1.5–4.5)
Potassium: 4.4 mmol/L (ref 3.5–5.2)
Sodium: 133 mmol/L — ABNORMAL LOW (ref 134–144)
TOTAL PROTEIN: 7 g/dL (ref 6.0–8.5)

## 2018-10-18 LAB — MICROALBUMIN / CREATININE URINE RATIO
CREATININE, UR: 96.7 mg/dL
Microalb/Creat Ratio: 8.3 mg/g creat (ref 0.0–30.0)
Microalbumin, Urine: 8 ug/mL

## 2018-10-18 LAB — TSH: TSH: 3.65 u[IU]/mL (ref 0.450–4.500)

## 2018-10-18 LAB — VITAMIN D 25 HYDROXY (VIT D DEFICIENCY, FRACTURES): VIT D 25 HYDROXY: 77.5 ng/mL (ref 30.0–100.0)

## 2018-10-19 DIAGNOSIS — M79672 Pain in left foot: Secondary | ICD-10-CM | POA: Diagnosis not present

## 2018-10-19 DIAGNOSIS — D2372 Other benign neoplasm of skin of left lower limb, including hip: Secondary | ICD-10-CM | POA: Diagnosis not present

## 2018-10-20 DIAGNOSIS — Z1212 Encounter for screening for malignant neoplasm of rectum: Secondary | ICD-10-CM | POA: Diagnosis not present

## 2018-10-20 DIAGNOSIS — Z1211 Encounter for screening for malignant neoplasm of colon: Secondary | ICD-10-CM | POA: Diagnosis not present

## 2018-10-24 ENCOUNTER — Ambulatory Visit: Payer: Medicare HMO | Admitting: *Deleted

## 2018-10-24 ENCOUNTER — Other Ambulatory Visit: Payer: Self-pay | Admitting: Family Medicine

## 2018-10-24 VITALS — BP 142/84 | HR 74

## 2018-10-24 DIAGNOSIS — E785 Hyperlipidemia, unspecified: Principal | ICD-10-CM

## 2018-10-24 DIAGNOSIS — Z0130 Encounter for examination of blood pressure without abnormal findings: Secondary | ICD-10-CM

## 2018-10-24 DIAGNOSIS — E1169 Type 2 diabetes mellitus with other specified complication: Secondary | ICD-10-CM

## 2018-10-24 MED ORDER — PRAVASTATIN SODIUM 40 MG PO TABS: 40.0000 mg | ORAL_TABLET | Freq: Every day | ORAL | 1 refills | Status: DC

## 2018-10-24 NOTE — Progress Notes (Signed)
Pt here for BP check BP 148 82 P 73  Rck BP 142 84 P 74

## 2018-10-25 ENCOUNTER — Telehealth: Payer: Self-pay | Admitting: Family Medicine

## 2018-10-25 NOTE — Telephone Encounter (Signed)
Pt aware of rx being called in per result note.

## 2018-10-27 LAB — COLOGUARD: COLOGUARD: NEGATIVE

## 2018-11-10 ENCOUNTER — Telehealth: Payer: Self-pay | Admitting: Family Medicine

## 2018-11-10 NOTE — Telephone Encounter (Signed)
Left message stating Dr Darnell Level out of office today but will return on Monday and will call pt back once this has been addressed.

## 2018-11-10 NOTE — Telephone Encounter (Signed)
Pt states that some of her medicine was changed (janumet and metformin) states that she is needing metformin to take at night. She is used to take one in morning and one at night, now she is taking one janumet in morning, its not enough for her. States that her BS have been high. She has never this high of BS, in mornings it can be up in 200's.  Pharmacy: [facility]

## 2018-11-13 ENCOUNTER — Other Ambulatory Visit: Payer: Self-pay | Admitting: Family Medicine

## 2018-11-13 MED ORDER — METFORMIN HCL 1000 MG PO TABS: 1000.0000 mg | ORAL_TABLET | Freq: Every day | ORAL | 0 refills | Status: DC

## 2018-11-13 NOTE — Telephone Encounter (Signed)
Pm Metformin sent to pharmacy.

## 2018-11-13 NOTE — Telephone Encounter (Signed)
Left message, medicine was sent in.

## 2018-11-21 ENCOUNTER — Ambulatory Visit: Payer: Medicare HMO | Admitting: Family Medicine

## 2019-01-07 ENCOUNTER — Other Ambulatory Visit: Payer: Self-pay | Admitting: Family Medicine

## 2019-01-18 ENCOUNTER — Ambulatory Visit: Payer: Medicare HMO | Admitting: Family

## 2019-02-04 ENCOUNTER — Other Ambulatory Visit: Payer: Self-pay | Admitting: Family Medicine

## 2019-03-14 DIAGNOSIS — R69 Illness, unspecified: Secondary | ICD-10-CM | POA: Diagnosis not present

## 2019-04-03 ENCOUNTER — Other Ambulatory Visit: Payer: Self-pay | Admitting: Family Medicine

## 2019-04-17 ENCOUNTER — Other Ambulatory Visit: Payer: Self-pay | Admitting: Family Medicine

## 2019-04-23 ENCOUNTER — Ambulatory Visit (INDEPENDENT_AMBULATORY_CARE_PROVIDER_SITE_OTHER): Payer: Medicare HMO | Admitting: Family Medicine

## 2019-04-23 ENCOUNTER — Other Ambulatory Visit: Payer: Self-pay

## 2019-04-23 ENCOUNTER — Encounter: Payer: Self-pay | Admitting: Family Medicine

## 2019-04-23 VITALS — BP 128/60

## 2019-04-23 DIAGNOSIS — E119 Type 2 diabetes mellitus without complications: Secondary | ICD-10-CM | POA: Diagnosis not present

## 2019-04-23 DIAGNOSIS — E1169 Type 2 diabetes mellitus with other specified complication: Secondary | ICD-10-CM

## 2019-04-23 DIAGNOSIS — E785 Hyperlipidemia, unspecified: Secondary | ICD-10-CM | POA: Diagnosis not present

## 2019-04-23 DIAGNOSIS — E1159 Type 2 diabetes mellitus with other circulatory complications: Secondary | ICD-10-CM | POA: Diagnosis not present

## 2019-04-23 DIAGNOSIS — I10 Essential (primary) hypertension: Secondary | ICD-10-CM | POA: Diagnosis not present

## 2019-04-23 DIAGNOSIS — I152 Hypertension secondary to endocrine disorders: Secondary | ICD-10-CM

## 2019-04-23 MED ORDER — AMLODIPINE BESYLATE 5 MG PO TABS: 5.0000 mg | ORAL_TABLET | Freq: Every day | ORAL | 3 refills | Status: DC

## 2019-04-23 MED ORDER — SITAGLIPTIN PHOS-METFORMIN HCL 50-1000 MG PO TABS: 1.0000 | ORAL_TABLET | Freq: Two times a day (BID) | ORAL | 2 refills | Status: DC

## 2019-04-23 NOTE — Progress Notes (Signed)
Telephone visit  Subjective: CC:f/u BP PCP: Sharion Balloon, FNP GLO:VFIEP [name] is a 68 y.o. female calls for telephone consult today. Patient provides verbal consent for consult held via phone.  Location of patient: home Location of provider: [facility] Others present for call: none  1. Type 2 Diabetes w/ HTN, HLD:  Patient reports: AVG fasting BG 194; Low at home: 157, Taking medication(s): Janumet X are [phone] mg every morning and metformin 1000 mg every afternoon.  She also takes glipizide XL 10 mg, Pravachol 40 mg, Norvasc 5 mg daily and hydrochlorothiazide 25 mg daily.  She has not been following a good diet or exercising regularly.  She identifies this as something that would improve her blood sugars and blood pressures.  She reports recent BP 115-133/65-75.    Last eye exam: Needs Last foot exam: Needs Last A1c:  Lab Results  Component Value Date   HGBA1C 8.0 (H) 10/17/2018   Nephropathy screen indicated?:  Up-to-date, 10/2018. Last flu, zoster and/or pneumovax:  Immunization History  Administered Date(s) Administered  . Influenza Inj Mdck Quad Pf 09/01/2017  . Influenza Split 09/21/2013  . Influenza, High Dose Seasonal PF 08/24/2018  . Influenza,inj,Quad PF,6+ Mos 09/19/2014, 09/03/2015, 08/04/2016  . Influenza-Unspecified 08/30/2017  . Pneumococcal Conjugate-13 10/17/2018  . Pneumococcal Polysaccharide-23 10/20/2012  . Tdap 07/06/2011    ROS: denies chest pain, shortness of breath, lower extremity edema.  No foot ulcers.  ROS: Per HPI  Allergies  Allergen Reactions  . Lipitor [Atorvastatin]   . Morphine And Related    Past Medical History:  Diagnosis Date  . Diabetes mellitus without complication ([place])   . Hyperlipidemia   . Hypertension   . Palpitations   . Vertigo     Current Outpatient Medications:  .  amLODipine (NORVASC) 5 MG tablet, Take 1 tablet (5 mg total) by mouth daily. (Please make your 6 mos ckup), Disp: 90 tablet, Rfl: 0 .  blood  glucose meter kit and supplies, Check BS once daily, Disp: 1 each, Rfl: 0 .  busPIRone (BUSPAR) 15 MG tablet, Take 1/2 to 1 tablet every 8 hours for nerves, Disp: 180 tablet, Rfl: 1 .  Cholecalciferol (VITAMIN D3) 50000 units CAPS, TAKE 1 CAPSULE (50,000 UNITS TOTAL) BY MOUTH EVERY 7 (SEVEN) DAYS., Disp: 12 capsule, Rfl: 0 .  cyclobenzaprine (FLEXERIL) 5 MG tablet, TAKE 1 TABLET BY MOUTH 3 TIMES A DAY AS NEEDED FOR MUSCLE SPASMS, Disp: 30 tablet, Rfl: 1 .  glipiZIDE (GLUCOTROL XL) 10 MG 24 hr tablet, TAKE 1 TABLET BY MOUTH DAILY **NEEDS OFFICE VISIT FOR NEXT REFILL**, Disp: 90 tablet, Rfl: 3 .  hydrochlorothiazide (HYDRODIURIL) 25 MG tablet, Take 1 tablet (25 mg total) by mouth daily., Disp: 90 tablet, Rfl: 3 .  ibandronate (BONIVA) 150 MG tablet, Take 1 tablet once per month. Take in the morning with a full glass of water, on an empty stomach, and do not eat or lie down for 30 min., Disp: 3 tablet, Rfl: 3 .  metFORMIN (GLUCOPHAGE) 1000 MG tablet, TAKE 1 TABLET (1,000 MG TOTAL) BY MOUTH DAILY WITH SUPPER., Disp: 90 tablet, Rfl: 0 .  ONETOUCH VERIO test strip, USE TO TEST BLOOD SUGAR ONCE DAILY, Disp: 100 each, Rfl: 11 .  pravastatin (PRAVACHOL) 40 MG tablet, Take 1 tablet (40 mg total) by mouth daily., Disp: 90 tablet, Rfl: 1 .  SitaGLIPtin-MetFORMIN HCl (JANUMET XR) [phone] MG TB24, Take 1 tablet by mouth daily with breakfast. (Needs to be seen before next refill), Disp: 30  tablet, Rfl: 0  Blood pressure 128/60.   Assessment/ Plan: 68 y.o. female   1. Hypertension associated with diabetes ([place]) Under much better control with amlodipine 5 mg daily.  This is been renewed.  Plan to check BMP given hyponatremia on last visit.  May need to consider discontinue hydrochlorothiazide if she has ongoing hyponatremia. - amLODipine (NORVASC) 5 MG tablet; Take 1 tablet (5 mg total) by mouth daily.  Dispense: 90 tablet; Refill: 3 - Basic Metabolic Panel; Future  2. Hyperlipidemia associated with type 2  diabetes mellitus ([place]) Not due for fasting lipid panel until December.  Continue pravastatin  3. Type 2 diabetes mellitus without complication, without long-term current use of insulin (HCC) Does not sound controlled.  Because she felt she did better with the twice daily dosing of Janumet I have split this to 50-1000 mg twice daily.  She is to discontinue the nighttime dose of metformin and totally discontinue the Janumet XR.  We will plan for A1c at our next visit in 3 weeks, which has been scheduled.  If she has persistent hyperglycemia and no downtrending blood sugars with change in therapy, we will consider adding injectable like Trulicity or Ozempic.  This may assist her on weight loss as well. - SitaGLIPtin-metformin (JANUMET) 50-1000 MG tablet; Take 1 tablet by mouth 2 (two) times daily with a meal.  Dispense: 60 tablet; Refill: 2 - Bayer DCA Hb A1c Waived; Future   Start time: 8:18am End time: 8:27am  Total time spent on patient care (including telephone call/ virtual visit): 15 minutes  [name], [place] [phone]

## 2019-05-01 ENCOUNTER — Other Ambulatory Visit: Payer: Self-pay | Admitting: Family

## 2019-05-14 ENCOUNTER — Ambulatory Visit: Payer: Self-pay | Admitting: Family Medicine

## 2019-05-21 NOTE — Progress Notes (Signed)
Subjective: CC: DM2, HTN, HLD PCP: [name], DO HYQ:[id number] [name] is a 68 y.o. female presenting to clinic today for:  1. Type 2 Diabetes w/ HTN, HLD:  Patient reports fasting blood sugars recently have been running between 150s and 190s.  This is an improvement from greater than 200 with the [place].  She reports compliance with Janumet 50-1000 mg twice daily, glipizide XL 10 mg daily, Norvasc 5 mg daily and Pravachol 40 mg daily.  Blood pressures have been running around 120s over 70s.  Last eye exam: She has an upcoming appointment with Dr. Theodosia Blender office Last foot exam: Due Last A1c:  Lab Results  Component Value Date   HGBA1C 8.0 (H) 10/17/2018   Nephropathy screen indicated?: UTD, 10/2018 Last flu, zoster and/or pneumovax:  Immunization History  Administered Date(s) Administered  . Influenza Inj Mdck Quad Pf 09/01/2017  . Influenza Split 09/21/2013  . Influenza, High Dose Seasonal PF 08/24/2018  . Influenza,inj,Quad PF,6+ Mos 09/19/2014, 09/03/2015, 08/04/2016  . Influenza-Unspecified 08/30/2017  . Pneumococcal Conjugate-13 10/17/2018  . Pneumococcal Polysaccharide-23 10/20/2012  . Tdap 07/06/2011    ROS: Reports nocturia but otherwise no polyuria.  No polydipsia.  She has occasional visual disturbance and has evaluation as above.  Denies any chest pain, shortness of breath.  She does have occasional skipped beats of her heart which is chronic and has been evaluated previously.  No loss of consciousness.  Denies any sensation changes in the feet.  No ulcerations.  No history of pancreatitis or MEN tumors.   Allergies  Allergen Reactions  . Lipitor [Atorvastatin]   . Morphine And Related    Past Medical History:  Diagnosis Date  . Diabetes mellitus without complication ([place])   . Hyperlipidemia   . Hypertension   . Palpitations   . Vertigo     Current Outpatient Medications:  .  amLODipine (NORVASC) 5 MG tablet, Take 1 tablet (5 mg  total) by mouth daily., Disp: 90 tablet, Rfl: 3 .  blood glucose meter kit and supplies, Check BS once daily, Disp: 1 each, Rfl: 0 .  busPIRone (BUSPAR) 15 MG tablet, Take 1/2 to 1 tablet every 8 hours for nerves, Disp: 180 tablet, Rfl: 1 .  cyclobenzaprine (FLEXERIL) 5 MG tablet, TAKE 1 TABLET BY MOUTH 3 TIMES A DAY AS NEEDED FOR MUSCLE SPASMS, Disp: 30 tablet, Rfl: 1 .  glipiZIDE (GLUCOTROL XL) 10 MG 24 hr tablet, TAKE 1 TABLET BY MOUTH DAILY **NEEDS OFFICE VISIT FOR NEXT REFILL**, Disp: 90 tablet, Rfl: 3 .  hydrochlorothiazide (HYDRODIURIL) 25 MG tablet, Take 1 tablet (25 mg total) by mouth daily., Disp: 90 tablet, Rfl: 3 .  ONETOUCH VERIO test strip, USE TO TEST BLOOD SUGAR ONCE DAILY, Disp: 100 each, Rfl: 11 .  pravastatin (PRAVACHOL) 40 MG tablet, Take 1 tablet (40 mg total) by mouth daily., Disp: 90 tablet, Rfl: 1 .  sitaGLIPtin-metformin (JANUMET) 50-1000 MG tablet, Take 1 tablet by mouth 2 (two) times daily with a meal., Disp: 60 tablet, Rfl: 2 Social History   Socioeconomic History  . Marital status: Married    Spouse name: Not on file  . Number of children: Not on file  . Years of education: Not on file  . Highest education level: Not on file  Occupational History  . Not on file  Social Needs  . Financial resource strain: Not on file  . Food insecurity    Worry: Not on file    Inability: Not on file  .  Transportation needs    Medical: Not on file    Non-medical: Not on file  Tobacco Use  . Smoking status: Former Research scientist (life sciences)  . Smokeless tobacco: Never Used  Substance and Sexual Activity  . Alcohol use: No  . Drug use: No  . Sexual activity: Not on file  Lifestyle  . Physical activity    Days per week: Not on file    Minutes per session: Not on file  . Stress: Not on file  Relationships  . Social Herbalist on phone: Not on file    Gets together: Not on file    Attends religious service: Not on file    Active member of club or organization: Not on file     Attends meetings of clubs or organizations: Not on file    Relationship status: Not on file  . Intimate partner violence    Fear of current or ex partner: Not on file    Emotionally abused: Not on file    Physically abused: Not on file    Forced sexual activity: Not on file  Other Topics Concern  . Not on file  Social History Narrative  . Not on file   Family History  Problem Relation Age of Onset  . Coronary artery disease Mother   . Diabetes Mother   . Coronary artery disease Father   . Diabetes Sister     Objective: Office vital signs reviewed. BP (!) 160/92   Pulse 83   Temp 97.9 F (36.6 C) (Oral)   Ht '5\' 7"'  (1.702 m)   Wt 223 lb (101.2 kg)   BMI 34.93 kg/m   Physical Examination:  General: Awake, alert, well nourished, No acute distress HEENT: Normal, sclera white, MMM Cardio: regular rate and rhythm, S1S2 heard, no murmurs appreciated Pulm: clear to auscultation bilaterally, no wheezes, rhonchi or rales; normal work of breathing on room air Extremities: warm, well perfused, No edema, cyanosis or clubbing; +2 pulses bilaterally Skin: dry; intact; no rashes or lesions Neuro: see DM foot Diabetic Foot Exam - Simple   Simple Foot Form Diabetic Foot exam was performed with the following findings: Yes 05/28/2019  9:33 AM  Visual Inspection No deformities, no ulcerations, no other skin breakdown bilaterally: Yes Sensation Testing Intact to touch and monofilament testing bilaterally: Yes Pulse Check Posterior Tibialis and Dorsalis pulse intact bilaterally: Yes Comments Mildly decreased vibratory sensation in the right compared to the left.      Assessment/ Plan: 68 y.o. female   1. Uncontrolled type 2 diabetes mellitus with hyperglycemia (HCC) A1c worsened at 9.4 today.  I have discussed the options and we will plan to add Ozempic at 0.25 mg subcu every week for 4 weeks.  Then increase to 0.5 mg subcu weekly.  She will follow-up in 3 months, sooner if needed -  Bayer DCA Hb A1c Waived - Basic Metabolic Panel - [id number],6.26 or 0.5MG/DOS, (OZEMPIC, 0.25 OR 0.5 MG/DOSE,) 2 MG/1.5ML SOPN; Inject 0.25 mg into the skin once a week for 28 days, THEN 0.5 mg once a week for 28 days.  Dispense: 1 pen; Refill: 2  2. Hyperlipidemia associated with type 2 diabetes mellitus ([place]) Continue statin  3. Hypertension associated with diabetes ([place]) Borderline uncontrolled but after review of her home blood pressure she is well within normal range and at goal. - Basic Metabolic Panel  4. Morbid obesity ([place]) We discussed that weight loss may be a side effect of this medication, which she is  looking forward to. - Semaglutide,0.25 or 0.5MG/DOS, (OZEMPIC, 0.25 OR 0.5 MG/DOSE,) 2 MG/1.5ML SOPN; Inject 0.25 mg into the skin once a week for 28 days, THEN 0.5 mg once a week for 28 days.  Dispense: 1 pen; Refill: 2   Orders Placed This Encounter  Procedures  . Bayer DCA Hb A1c Waived  . Basic Metabolic Panel   No orders of the defined types were placed in this encounter.    [name], DO [place] [phone]

## 2019-05-24 ENCOUNTER — Telehealth: Payer: Self-pay | Admitting: Family

## 2019-05-24 NOTE — Telephone Encounter (Signed)
This is something she will have to take up with her provider in the future, she will have to call further ahead if she wants her provider to put labs in.  If she is coming in anyways then she should do an in person visit and just do labs then.

## 2019-05-24 NOTE — Telephone Encounter (Signed)
Patient states that she has had a hard time getting fasting blood sugars in the mornings to stay down.  The past two day BS reading has have been 183 and 190.  Patient also states that she is having some burning with urination and would like to come in today to go ahead and have blood work and urine specimen done.  Patient is very worried about BS and just wants "Peace of Mind".  Has appt 7/13 with [facility].  Patient aware Lajuana Ripple is out of the office until 7/10

## 2019-05-24 NOTE — Telephone Encounter (Signed)
Aware.  She will get the lab work done when she is here for her Monday appointment.

## 2019-05-25 ENCOUNTER — Other Ambulatory Visit: Payer: Self-pay

## 2019-05-28 ENCOUNTER — Other Ambulatory Visit: Payer: Self-pay

## 2019-05-28 ENCOUNTER — Encounter: Payer: Self-pay | Admitting: Family Medicine

## 2019-05-28 ENCOUNTER — Ambulatory Visit: Payer: Medicare HMO | Admitting: Family Medicine

## 2019-05-28 VITALS — BP 140/90 | HR 83 | Temp 97.9°F | Ht 67.0 in | Wt 223.0 lb

## 2019-05-28 DIAGNOSIS — E119 Type 2 diabetes mellitus without complications: Secondary | ICD-10-CM | POA: Diagnosis not present

## 2019-05-28 DIAGNOSIS — E1169 Type 2 diabetes mellitus with other specified complication: Secondary | ICD-10-CM

## 2019-05-28 DIAGNOSIS — I10 Essential (primary) hypertension: Secondary | ICD-10-CM

## 2019-05-28 DIAGNOSIS — E785 Hyperlipidemia, unspecified: Secondary | ICD-10-CM

## 2019-05-28 DIAGNOSIS — E1165 Type 2 diabetes mellitus with hyperglycemia: Secondary | ICD-10-CM

## 2019-05-28 DIAGNOSIS — E1159 Type 2 diabetes mellitus with other circulatory complications: Secondary | ICD-10-CM

## 2019-05-28 DIAGNOSIS — E875 Hyperkalemia: Secondary | ICD-10-CM

## 2019-05-28 DIAGNOSIS — I152 Hypertension secondary to endocrine disorders: Secondary | ICD-10-CM

## 2019-05-28 LAB — BAYER DCA HB A1C WAIVED: HB A1C (BAYER DCA - WAIVED): 9.4 % — ABNORMAL HIGH (ref <7.0)

## 2019-05-28 MED ORDER — OZEMPIC (0.25 OR 0.5 MG/DOSE) 2 MG/1.5ML ~~LOC~~ SOPN: PEN_INJECTOR | SUBCUTANEOUS | 2 refills | Status: DC

## 2019-05-28 NOTE — Patient Instructions (Signed)
Continue Janumet and glipizide.  We are adding Ozempic.  This is a ONCE weekly injection for diabetes.  You may see weight loss as well.  Some stomach upset is typical in the beginning so you will start at a lower dose of 0.25mg  injected ONCE weekly for 4 weeks.  THEN, you can increase to 0.5mg  injected ONCE weekly.  See me in 3 weeks for recheck.   Diabetes Mellitus and Nutrition, Adult When you have diabetes (diabetes mellitus), it is very important to have healthy eating habits because your blood sugar (glucose) levels are greatly affected by what you eat and drink. Eating healthy foods in the appropriate amounts, at about the same times every day, can help you:  Control your blood glucose.  Lower your risk of heart disease.  Improve your blood pressure.  Reach or maintain a healthy weight. Every person with diabetes is different, and each person has different needs for a meal plan. Your health care provider may recommend that you work with a diet and nutrition specialist (dietitian) to make a meal plan that is best for you. Your meal plan may vary depending on factors such as:  The calories you need.  The medicines you take.  Your weight.  Your blood glucose, blood pressure, and cholesterol levels.  Your activity level.  Other health conditions you have, such as heart or kidney disease. How do carbohydrates affect me? Carbohydrates, also called carbs, affect your blood glucose level more than any other type of food. Eating carbs naturally raises the amount of glucose in your blood. Carb counting is a method for keeping track of how many carbs you eat. Counting carbs is important to keep your blood glucose at a healthy level, especially if you use insulin or take certain oral diabetes medicines. It is important to know how many carbs you can safely have in each meal. This is different for every person. Your dietitian can help you calculate how many carbs you should have at each meal  and for each snack. Foods that contain carbs include:  Bread, cereal, rice, pasta, and crackers.  Potatoes and corn.  Peas, beans, and lentils.  Milk and yogurt.  Fruit and juice.  Desserts, such as cakes, cookies, ice cream, and candy. How does alcohol affect me? Alcohol can cause a sudden decrease in blood glucose (hypoglycemia), especially if you use insulin or take certain oral diabetes medicines. Hypoglycemia can be a life-threatening condition. Symptoms of hypoglycemia (sleepiness, dizziness, and confusion) are similar to symptoms of having too much alcohol. If your health care provider says that alcohol is safe for you, follow these guidelines:  Limit alcohol intake to no more than 1 drink per day for nonpregnant women and 2 drinks per day for men. One drink equals 12 oz of beer, 5 oz of wine, or 1 oz of hard liquor.  Do not drink on an empty stomach.  Keep yourself hydrated with water, diet soda, or unsweetened iced tea.  Keep in mind that regular soda, juice, and other mixers may contain a lot of sugar and must be counted as carbs. What are tips for following this plan?  Reading food labels  Start by checking the serving size on the "Nutrition Facts" label of packaged foods and drinks. The amount of calories, carbs, fats, and other nutrients listed on the label is based on one serving of the item. Many items contain more than one serving per package.  Check the total grams (g) of carbs in one  serving. You can calculate the number of servings of carbs in one serving by dividing the total carbs by 15. For example, if a food has 30 g of total carbs, it would be equal to 2 servings of carbs.  Check the number of grams (g) of saturated and trans fats in one serving. Choose foods that have low or no amount of these fats.  Check the number of milligrams (mg) of salt (sodium) in one serving. Most people should limit total sodium intake to less than 2,300 mg per day.  Always  check the nutrition information of foods labeled as "low-fat" or "nonfat". These foods may be higher in added sugar or refined carbs and should be avoided.  Talk to your dietitian to identify your daily goals for nutrients listed on the label. Shopping  Avoid buying canned, premade, or processed foods. These foods tend to be high in fat, sodium, and added sugar.  Shop around the outside edge of the grocery store. This includes fresh fruits and vegetables, bulk grains, fresh meats, and fresh dairy. Cooking  Use low-heat cooking methods, such as baking, instead of high-heat cooking methods like deep frying.  Cook using healthy oils, such as olive, canola, or sunflower oil.  Avoid cooking with butter, cream, or high-fat meats. Meal planning  Eat meals and snacks regularly, preferably at the same times every day. Avoid going long periods of time without eating.  Eat foods high in fiber, such as fresh fruits, vegetables, beans, and whole grains. Talk to your dietitian about how many servings of carbs you can eat at each meal.  Eat 4-6 ounces (oz) of lean protein each day, such as lean meat, chicken, fish, eggs, or tofu. One oz of lean protein is equal to: ? 1 oz of meat, chicken, or fish. ? 1 egg. ?  cup of tofu.  Eat some foods each day that contain healthy fats, such as avocado, nuts, seeds, and fish. Lifestyle  Check your blood glucose regularly.  Exercise regularly as told by your health care provider. This may include: ? 150 minutes of moderate-intensity or vigorous-intensity exercise each week. This could be brisk walking, biking, or water aerobics. ? Stretching and doing strength exercises, such as yoga or weightlifting, at least 2 times a week.  Take medicines as told by your health care provider.  Do not use any products that contain nicotine or tobacco, such as cigarettes and e-cigarettes. If you need help quitting, ask your health care provider.  Work with a Social worker or  diabetes educator to identify strategies to manage stress and any emotional and social challenges. Questions to ask a health care provider  Do I need to meet with a diabetes educator?  Do I need to meet with a dietitian?  What number can I call if I have questions?  When are the best times to check my blood glucose? Where to find more information:  American Diabetes Association: diabetes.org  Academy of Nutrition and Dietetics: www.eatright.[employer] of Diabetes and Digestive and Kidney Diseases (NIH): [employer] Summary  A healthy meal plan will help you control your blood glucose and maintain a healthy lifestyle.  Working with a diet and nutrition specialist (dietitian) can help you make a meal plan that is best for you.  Keep in mind that carbohydrates (carbs) and alcohol have immediate effects on your blood glucose levels. It is important to count carbs and to use alcohol carefully. This information is not intended to replace advice given to  you by your health care provider. Make sure you discuss any questions you have with your health care provider. Document Released: 07/29/2005 Document Revised: 10/14/2017 Document Reviewed: 12/06/2016 Elsevier Patient Education  2020 [employer].

## 2019-05-29 DIAGNOSIS — L570 Actinic keratosis: Secondary | ICD-10-CM | POA: Diagnosis not present

## 2019-05-29 DIAGNOSIS — L821 Other seborrheic keratosis: Secondary | ICD-10-CM | POA: Diagnosis not present

## 2019-05-29 DIAGNOSIS — L819 Disorder of pigmentation, unspecified: Secondary | ICD-10-CM | POA: Diagnosis not present

## 2019-05-29 DIAGNOSIS — Z85828 Personal history of other malignant neoplasm of skin: Secondary | ICD-10-CM | POA: Diagnosis not present

## 2019-05-29 LAB — BASIC METABOLIC PANEL
BUN/Creatinine Ratio: 12 (ref 12–28)
BUN: 11 mg/dL (ref 8–27)
CO2: 24 mmol/L (ref 20–29)
Calcium: 9.8 mg/dL (ref 8.7–10.3)
Chloride: 95 mmol/L — ABNORMAL LOW (ref 96–106)
Creatinine, Ser: 0.93 mg/dL (ref 0.57–1.00)
GFR calc Af Amer: 74 mL/min/{1.73_m2} (ref >59)
GFR calc non Af Amer: 64 mL/min/{1.73_m2} (ref >59)
Glucose: 170 mg/dL — ABNORMAL HIGH (ref 65–99)
Potassium: 5.5 mmol/L — ABNORMAL HIGH (ref 3.5–5.2)
Sodium: 138 mmol/L (ref 134–144)

## 2019-05-30 NOTE — Addendum Note (Signed)
Addended byCarrolyn Leigh on: 05/30/2019 10:35 AM   Modules accepted: Orders

## 2019-06-01 ENCOUNTER — Telehealth: Payer: Self-pay | Admitting: Family Medicine

## 2019-06-01 ENCOUNTER — Other Ambulatory Visit: Payer: Self-pay | Admitting: Family Medicine

## 2019-06-01 MED ORDER — ONDANSETRON 4 MG PO TBDP: ORAL_TABLET | ORAL | 0 refills | Status: DC

## 2019-06-01 NOTE — Telephone Encounter (Signed)
Per pt, she took 0.25mg  of Ozempic on Wed morning Waves of nausea all day yesterday Vomiting today Please advise

## 2019-06-01 NOTE — Telephone Encounter (Signed)
Left message to please call our office. 

## 2019-06-01 NOTE — Telephone Encounter (Signed)
Patient states Ozempic she started this Wednesday is making her very Nauseated. This morning she is throwing up, with headache and little stomach pain. Pt states she does have some sinus issues going on. Got patient to check her temp oral was 96.9. Pt states she is shaking a little. BS was 156 has not ate anything this morning.

## 2019-06-01 NOTE — Telephone Encounter (Signed)
Aware of provider's advice. 

## 2019-06-01 NOTE — Telephone Encounter (Signed)
Nausea is very common with this medication.  If she is not tolerating the medication due to uncontrolled nausea and vomiting, would recommend discontinuing and we select something else.  Unfortunately, nausea is a very common side effect of this medication, particularly if she is over eating or eating carb rich foods.  I will send a nausea medication into her pharmacy to get her through the week.

## 2019-06-04 ENCOUNTER — Other Ambulatory Visit: Payer: Medicare HMO

## 2019-06-04 ENCOUNTER — Other Ambulatory Visit: Payer: Self-pay

## 2019-06-04 DIAGNOSIS — E875 Hyperkalemia: Secondary | ICD-10-CM | POA: Diagnosis not present

## 2019-06-05 LAB — BMP8+EGFR
BUN/Creatinine Ratio: 11 — ABNORMAL LOW (ref 12–28)
BUN: 10 mg/dL (ref 8–27)
CO2: 23 mmol/L (ref 20–29)
Calcium: 9.6 mg/dL (ref 8.7–10.3)
Chloride: 84 mmol/L — ABNORMAL LOW (ref 96–106)
Creatinine, Ser: 0.93 mg/dL (ref 0.57–1.00)
GFR calc Af Amer: 74 mL/min/{1.73_m2} (ref >59)
GFR calc non Af Amer: 64 mL/min/{1.73_m2} (ref >59)
Glucose: 150 mg/dL — ABNORMAL HIGH (ref 65–99)
Potassium: 4.3 mmol/L (ref 3.5–5.2)
Sodium: 127 mmol/L — ABNORMAL LOW (ref 134–144)

## 2019-06-12 ENCOUNTER — Other Ambulatory Visit: Payer: Self-pay | Admitting: *Deleted

## 2019-06-12 DIAGNOSIS — R69 Illness, unspecified: Secondary | ICD-10-CM | POA: Diagnosis not present

## 2019-06-12 MED ORDER — ONETOUCH VERIO VI STRP: ORAL_STRIP | 3 refills | Status: DC

## 2019-07-15 ENCOUNTER — Other Ambulatory Visit: Payer: Self-pay | Admitting: Family Medicine

## 2019-07-15 DIAGNOSIS — E119 Type 2 diabetes mellitus without complications: Secondary | ICD-10-CM

## 2019-07-19 ENCOUNTER — Other Ambulatory Visit: Payer: Self-pay

## 2019-07-20 ENCOUNTER — Ambulatory Visit (INDEPENDENT_AMBULATORY_CARE_PROVIDER_SITE_OTHER): Payer: Medicare HMO | Admitting: Family Medicine

## 2019-07-20 ENCOUNTER — Encounter: Payer: Self-pay | Admitting: Family Medicine

## 2019-07-20 DIAGNOSIS — E119 Type 2 diabetes mellitus without complications: Secondary | ICD-10-CM

## 2019-07-20 DIAGNOSIS — M5442 Lumbago with sciatica, left side: Secondary | ICD-10-CM | POA: Diagnosis not present

## 2019-07-20 DIAGNOSIS — E1159 Type 2 diabetes mellitus with other circulatory complications: Secondary | ICD-10-CM | POA: Diagnosis not present

## 2019-07-20 DIAGNOSIS — I10 Essential (primary) hypertension: Secondary | ICD-10-CM

## 2019-07-20 DIAGNOSIS — E785 Hyperlipidemia, unspecified: Secondary | ICD-10-CM

## 2019-07-20 DIAGNOSIS — I152 Hypertension secondary to endocrine disorders: Secondary | ICD-10-CM

## 2019-07-20 DIAGNOSIS — E1169 Type 2 diabetes mellitus with other specified complication: Secondary | ICD-10-CM

## 2019-07-20 DIAGNOSIS — G8929 Other chronic pain: Secondary | ICD-10-CM

## 2019-07-20 LAB — BAYER DCA HB A1C WAIVED: HB A1C (BAYER DCA - WAIVED): 6.7 % (ref <7.0)

## 2019-07-20 MED ORDER — PRAVASTATIN SODIUM 40 MG PO TABS: 40.0000 mg | ORAL_TABLET | Freq: Every day | ORAL | 1 refills | Status: DC

## 2019-07-20 MED ORDER — CYCLOBENZAPRINE HCL 5 MG PO TABS: ORAL_TABLET | ORAL | 1 refills | Status: DC

## 2019-07-20 NOTE — Patient Instructions (Addendum)
You are doing AWESOME.  You have dropped 20# since our last visit by diet and exercise.  Your A1c has gone from a 9 to 6.7.  Your diabetes is controlled and you should be proud of your hard work.  It's paying off!  If you see that your night time sugar starts dropping below 80.  CUT THE GLIPIZIDE in 1/2.  If it continues to stay low, STOP Glipizide.

## 2019-07-20 NOTE — Progress Notes (Signed)
Subjective: CC: DM2, HTN, HLD PCP: [name], DO RNH:AFBXU [name] is a 68 y.o. female presenting to clinic today for:  1. Type 2 Diabetes w/ HTN, HLD:  Patient reports fasting blood sugars 101-120s. H 168-200s. L80s.  She reports compliance with Janumet 50-1000 mg twice daily, glipizide XL 10 mg daily, Norvasc 5 mg daily and Pravachol 40 mg daily.  Ozempic was added at last visit but she notes after the first injection she became so sick she did not continue.  She got very strict on her diet and eliminated chips, bread, cookies and cake.  She has been increasing her physical activity, fruits and veggies and eating more solids.  She is successfully lost some weight as a result and her blood sugars have improved greatly.  When she has the lower blood sugars at nighttime she will go ahead and eat a rice cake.  Last eye exam: Done with Dr. Radford Pax on 08/06/2019  Last foot exam: Done Last A1c:  Lab Results  Component Value Date   HGBA1C 9.4 (H) 05/28/2019   Nephropathy screen indicated?: UTD, 10/2018 Last flu, zoster and/or pneumovax:  Immunization History  Administered Date(s) Administered  . Influenza Inj Mdck Quad Pf 09/01/2017  . Influenza Split 09/21/2013  . Influenza, High Dose Seasonal PF 08/24/2018  . Influenza,inj,Quad PF,6+ Mos 09/19/2014, 09/03/2015, 08/04/2016  . Influenza-Unspecified 08/30/2017  . Pneumococcal Conjugate-13 10/17/2018  . Pneumococcal Polysaccharide-23 10/20/2012  . Tdap 07/06/2011    ROS: She reports increased energy.  No chest pain, shortness of breath, lower extremity edema.  No dizziness.  No falls.   Allergies  Allergen Reactions  . Lipitor [Atorvastatin]   . Morphine And Related    Past Medical History:  Diagnosis Date  . Diabetes mellitus without complication ([place])   . Hyperlipidemia   . Hypertension   . Palpitations   . Vertigo     Current Outpatient Medications:  .  amLODipine (NORVASC) 5 MG tablet, Take 1 tablet (5  mg total) by mouth daily., Disp: 90 tablet, Rfl: 3 .  blood glucose meter kit and supplies, Check BS once daily, Disp: 1 each, Rfl: 0 .  busPIRone (BUSPAR) 15 MG tablet, Take 1/2 to 1 tablet every 8 hours for nerves, Disp: 180 tablet, Rfl: 1 .  cyclobenzaprine (FLEXERIL) 5 MG tablet, TAKE 1 TABLET BY MOUTH 3 TIMES A DAY AS NEEDED FOR MUSCLE SPASMS, Disp: 30 tablet, Rfl: 1 .  glipiZIDE (GLUCOTROL XL) 10 MG 24 hr tablet, TAKE 1 TABLET BY MOUTH DAILY **NEEDS OFFICE VISIT FOR NEXT REFILL**, Disp: 90 tablet, Rfl: 3 .  glucose blood (ONETOUCH VERIO) test strip, USE TO TEST BLOOD SUGAR ONCE DAILY Dx E11.9, Disp: 100 each, Rfl: 3 .  hydrochlorothiazide (HYDRODIURIL) 25 MG tablet, Take 1 tablet (25 mg total) by mouth daily., Disp: 90 tablet, Rfl: 3 .  JANUMET 50-1000 MG tablet, TAKE 1 TABLET BY MOUTH 2 (TWO) TIMES DAILY WITH A MEAL., Disp: 60 tablet, Rfl: 2 .  ondansetron (ZOFRAN ODT) 4 MG disintegrating tablet, Dissolve 1 tablet in mouth every 8 hours as needed for nausea/ vomiting., Disp: 20 tablet, Rfl: 0 .  pravastatin (PRAVACHOL) 40 MG tablet, Take 1 tablet (40 mg total) by mouth daily., Disp: 90 tablet, Rfl: 1 .  Semaglutide,0.25 or 0.5MG/DOS, (OZEMPIC, 0.25 OR 0.5 MG/DOSE,) 2 MG/1.5ML SOPN, Inject 0.25 mg into the skin once a week for 28 days, THEN 0.5 mg once a week for 28 days., Disp: 1 pen, Rfl: 2 Social History  Socioeconomic History  . Marital status: Married    Spouse name: Not on file  . Number of children: Not on file  . Years of education: Not on file  . Highest education level: Not on file  Occupational History  . Not on file  Social Needs  . Financial resource strain: Not on file  . Food insecurity    Worry: Not on file    Inability: Not on file  . Transportation needs    Medical: Not on file    Non-medical: Not on file  Tobacco Use  . Smoking status: Former Research scientist (life sciences)  . Smokeless tobacco: Never Used  Substance and Sexual Activity  . Alcohol use: No  . Drug use: No  . Sexual  activity: Not on file  Lifestyle  . Physical activity    Days per week: Not on file    Minutes per session: Not on file  . Stress: Not on file  Relationships  . Social Herbalist on phone: Not on file    Gets together: Not on file    Attends religious service: Not on file    Active member of club or organization: Not on file    Attends meetings of clubs or organizations: Not on file    Relationship status: Not on file  . Intimate partner violence    Fear of current or ex partner: Not on file    Emotionally abused: Not on file    Physically abused: Not on file    Forced sexual activity: Not on file  Other Topics Concern  . Not on file  Social History Narrative  . Not on file   Family History  Problem Relation Age of Onset  . Coronary artery disease Mother   . Diabetes Mother   . Coronary artery disease Father   . Diabetes Sister     Objective: Office vital signs reviewed. BP 137/86   Pulse 94   Temp (!) 97.3 F (36.3 C) (Temporal)   Ht _0  (1.702 m)   Wt 203 lb (92.1 kg)   SpO2 97%   BMI 31.79 kg/m   Physical Examination:  General: Awake, alert, well nourished, No acute distress HEENT: Normal, sclera white, MMM Cardio: regular rate and rhythm, S1S2 heard, no murmurs appreciated Pulm: clear to auscultation bilaterally, no wheezes, rhonchi or rales; normal work of breathing on room air Extremities: warm, well perfused, No edema, cyanosis or clubbing; +2 pulses bilaterally  Assessment/ Plan: 68 y.o. female   1. Type 2 diabetes mellitus without complication, without long-term current use of insulin (HCC) Under excellent control with A1c of 6.7 today.  She is done such a great job with lifestyle modification and is had a successful 20 pound weight loss.  Hopefully this will help her degenerative changes in her back. - Bayer DCA Hb A1c Waived  2. Hypertension associated with diabetes ([place]) Controlled.  Continue current regimen - Basic Metabolic Panel   3. Chronic left-sided low back pain with left-sided sciatica Flexeril renewed for as needed use.  She will be seeing a chiropractor soon as well - cyclobenzaprine (FLEXERIL) 5 MG tablet; TAKE 1 TABLET BY MOUTH 3 TIMES A DAY AS NEEDED FOR MUSCLE SPASMS  Dispense: 30 tablet; Refill: 1  4. Hyperlipidemia associated with type 2 diabetes mellitus (HCC) Stable.  Continue statin - pravastatin (PRAVACHOL) 40 MG tablet; Take 1 tablet (40 mg total) by mouth daily.  Dispense: 90 tablet; Refill: 1   No orders of the  defined types were placed in this encounter.  No orders of the defined types were placed in this encounter.    [name], DO [place] [phone]

## 2019-07-21 LAB — BASIC METABOLIC PANEL
BUN/Creatinine Ratio: 7 — ABNORMAL LOW (ref 12–28)
BUN: 7 mg/dL — ABNORMAL LOW (ref 8–27)
CO2: 25 mmol/L (ref 20–29)
Calcium: 10.2 mg/dL (ref 8.7–10.3)
Chloride: 88 mmol/L — ABNORMAL LOW (ref 96–106)
Creatinine, Ser: 0.99 mg/dL (ref 0.57–1.00)
GFR calc Af Amer: 69 mL/min/{1.73_m2} (ref >59)
GFR calc non Af Amer: 60 mL/min/{1.73_m2} (ref >59)
Glucose: 128 mg/dL — ABNORMAL HIGH (ref 65–99)
Potassium: 5.7 mmol/L — ABNORMAL HIGH (ref 3.5–5.2)
Sodium: 130 mmol/L — ABNORMAL LOW (ref 134–144)

## 2019-07-24 ENCOUNTER — Other Ambulatory Visit: Payer: Self-pay | Admitting: Family Medicine

## 2019-07-24 ENCOUNTER — Telehealth: Payer: Self-pay | Admitting: *Deleted

## 2019-07-24 DIAGNOSIS — E871 Hypo-osmolality and hyponatremia: Secondary | ICD-10-CM

## 2019-07-24 NOTE — Telephone Encounter (Addendum)
Prior Auth for Cyclobenzaprine HCL 5mg  tab-APPROVED till 10/22/2019  Key: [id number] -   PA Case ID: [id number]     Your information has been submitted to [place] Medicare Part D. [employer] will review the request and will issue a decision, typically within 1-3 days from your submission. You can check the updated outcome later by reopening this request.  If [employer] has not responded in 1-3 days or if you have any questions about your ePA request, please contact [place] Medicare Part D at [phone]. If you think there may be a problem with your PA request, use our live chat feature at the bottom right.

## 2019-08-02 DIAGNOSIS — H52223 Regular astigmatism, bilateral: Secondary | ICD-10-CM | POA: Diagnosis not present

## 2019-08-02 DIAGNOSIS — H2513 Age-related nuclear cataract, bilateral: Secondary | ICD-10-CM | POA: Diagnosis not present

## 2019-08-02 DIAGNOSIS — Z83511 Family history of glaucoma: Secondary | ICD-10-CM | POA: Diagnosis not present

## 2019-08-02 DIAGNOSIS — E119 Type 2 diabetes mellitus without complications: Secondary | ICD-10-CM | POA: Diagnosis not present

## 2019-08-02 DIAGNOSIS — H35033 Hypertensive retinopathy, bilateral: Secondary | ICD-10-CM | POA: Diagnosis not present

## 2019-08-02 DIAGNOSIS — H5203 Hypermetropia, bilateral: Secondary | ICD-10-CM | POA: Diagnosis not present

## 2019-08-02 DIAGNOSIS — H40013 Open angle with borderline findings, low risk, bilateral: Secondary | ICD-10-CM | POA: Diagnosis not present

## 2019-08-02 DIAGNOSIS — H524 Presbyopia: Secondary | ICD-10-CM | POA: Diagnosis not present

## 2019-08-02 DIAGNOSIS — Z7984 Long term (current) use of oral hypoglycemic drugs: Secondary | ICD-10-CM | POA: Diagnosis not present

## 2019-08-02 DIAGNOSIS — H5030 Unspecified intermittent heterotropia: Secondary | ICD-10-CM | POA: Diagnosis not present

## 2019-08-02 LAB — HM DIABETES EYE EXAM

## 2019-08-15 ENCOUNTER — Ambulatory Visit (INDEPENDENT_AMBULATORY_CARE_PROVIDER_SITE_OTHER): Payer: Medicare HMO

## 2019-08-15 DIAGNOSIS — Z23 Encounter for immunization: Secondary | ICD-10-CM | POA: Diagnosis not present

## 2019-08-19 DIAGNOSIS — R69 Illness, unspecified: Secondary | ICD-10-CM | POA: Diagnosis not present

## 2019-10-16 ENCOUNTER — Other Ambulatory Visit: Payer: Self-pay | Admitting: Family Medicine

## 2019-10-16 DIAGNOSIS — E119 Type 2 diabetes mellitus without complications: Secondary | ICD-10-CM

## 2019-12-11 ENCOUNTER — Other Ambulatory Visit: Payer: Self-pay | Admitting: Family Medicine

## 2019-12-11 DIAGNOSIS — E119 Type 2 diabetes mellitus without complications: Secondary | ICD-10-CM

## 2019-12-11 DIAGNOSIS — R69 Illness, unspecified: Secondary | ICD-10-CM | POA: Diagnosis not present

## 2020-01-01 ENCOUNTER — Other Ambulatory Visit: Payer: Self-pay | Admitting: Family Medicine

## 2020-01-02 ENCOUNTER — Other Ambulatory Visit: Payer: Self-pay | Admitting: Family Medicine

## 2020-01-02 DIAGNOSIS — E119 Type 2 diabetes mellitus without complications: Secondary | ICD-10-CM

## 2020-01-09 ENCOUNTER — Other Ambulatory Visit: Payer: Self-pay | Admitting: Family Medicine

## 2020-01-09 DIAGNOSIS — E119 Type 2 diabetes mellitus without complications: Secondary | ICD-10-CM

## 2020-01-30 ENCOUNTER — Other Ambulatory Visit: Payer: Self-pay | Admitting: Family Medicine

## 2020-01-30 DIAGNOSIS — E119 Type 2 diabetes mellitus without complications: Secondary | ICD-10-CM

## 2020-02-02 ENCOUNTER — Other Ambulatory Visit: Payer: Self-pay | Admitting: Family Medicine

## 2020-02-02 DIAGNOSIS — E119 Type 2 diabetes mellitus without complications: Secondary | ICD-10-CM

## 2020-02-04 NOTE — Telephone Encounter (Signed)
Patient last seen 07/20/2019. Patient needs to be seen

## 2020-02-05 ENCOUNTER — Telehealth (INDEPENDENT_AMBULATORY_CARE_PROVIDER_SITE_OTHER): Payer: Medicare HMO | Admitting: Family Medicine

## 2020-02-05 ENCOUNTER — Other Ambulatory Visit: Payer: Self-pay | Admitting: Family Medicine

## 2020-02-05 ENCOUNTER — Encounter: Payer: Self-pay | Admitting: Family Medicine

## 2020-02-05 ENCOUNTER — Telehealth: Payer: Self-pay | Admitting: *Deleted

## 2020-02-05 DIAGNOSIS — I10 Essential (primary) hypertension: Secondary | ICD-10-CM

## 2020-02-05 DIAGNOSIS — I152 Hypertension secondary to endocrine disorders: Secondary | ICD-10-CM

## 2020-02-05 DIAGNOSIS — E119 Type 2 diabetes mellitus without complications: Secondary | ICD-10-CM | POA: Diagnosis not present

## 2020-02-05 DIAGNOSIS — E1159 Type 2 diabetes mellitus with other circulatory complications: Secondary | ICD-10-CM | POA: Diagnosis not present

## 2020-02-05 DIAGNOSIS — E1169 Type 2 diabetes mellitus with other specified complication: Secondary | ICD-10-CM | POA: Diagnosis not present

## 2020-02-05 DIAGNOSIS — E785 Hyperlipidemia, unspecified: Secondary | ICD-10-CM

## 2020-02-05 DIAGNOSIS — M810 Age-related osteoporosis without current pathological fracture: Secondary | ICD-10-CM

## 2020-02-05 DIAGNOSIS — M5442 Lumbago with sciatica, left side: Secondary | ICD-10-CM | POA: Diagnosis not present

## 2020-02-05 DIAGNOSIS — G8929 Other chronic pain: Secondary | ICD-10-CM

## 2020-02-05 DIAGNOSIS — E559 Vitamin D deficiency, unspecified: Secondary | ICD-10-CM

## 2020-02-05 MED ORDER — JANUMET 50-1000 MG PO TABS: 1.0000 | ORAL_TABLET | Freq: Two times a day (BID) | ORAL | 3 refills | Status: DC

## 2020-02-05 MED ORDER — HYDROCHLOROTHIAZIDE 25 MG PO TABS: 25.0000 mg | ORAL_TABLET | Freq: Every day | ORAL | 3 refills | Status: DC

## 2020-02-05 MED ORDER — GLIPIZIDE ER 10 MG PO TB24: 10.0000 mg | ORAL_TABLET | Freq: Every day | ORAL | 3 refills | Status: DC

## 2020-02-05 MED ORDER — CYCLOBENZAPRINE HCL 5 MG PO TABS: ORAL_TABLET | ORAL | 1 refills | Status: DC

## 2020-02-05 MED ORDER — AMLODIPINE BESYLATE 5 MG PO TABS: 5.0000 mg | ORAL_TABLET | Freq: Every day | ORAL | 3 refills | Status: DC

## 2020-02-05 MED ORDER — PRAVASTATIN SODIUM 40 MG PO TABS: 40.0000 mg | ORAL_TABLET | Freq: Every day | ORAL | 3 refills | Status: DC

## 2020-02-05 NOTE — Telephone Encounter (Signed)
Says not eligible / no match with insurance   Called pt - aware ( she says there haven't been any changes)   She will call her insurance and see what is going on.

## 2020-02-05 NOTE — Progress Notes (Signed)
MyChart Video visit  Subjective: CC: DM PCP: [name], DO WEX:HBZJI C Gusler Erica Evans is a 69 y.o. female calls for video consult today. Patient provides verbal consent for consult held via video.  Due to COVID-19 pandemic this visit was conducted virtually. This visit type was conducted due to national recommendations for restrictions regarding the COVID-19 Pandemic (e.g. social distancing, sheltering in place) in an effort to limit this patient's exposure and mitigate transmission in our community. All issues noted in this document were discussed and addressed.  A physical exam was not performed with this format.   Location of patient: Home Location of provider: [facility] Others present for call: none  1. Type 2 Diabetes w/HTN, HLD:  Patient reports average am BGs: 125. High at home: 150-160s; Low at home: no lows, Taking medication(s): Janumet 50-1055m, glucotrol XL 10, norvasc 5, HCTZ 2103m.  She is actively working on weight loss and has had some success with this so far.  She is going to start walking again soon now that is getting warm.  Last eye exam: UTD Last foot exam: UTD Last A1c:  Lab Results  Component Value Date   HGBA1C 6.7 07/20/2019   Nephropathy screen indicated?:  Yes Last flu, zoster and/or pneumovax:  Immunization History  Administered Date(s) Administered  . Fluad Quad(high Dose 65+) 08/15/2019  . Influenza Inj Mdck Quad Pf 09/01/2017  . Influenza Split 09/21/2013  . Influenza, High Dose Seasonal PF 08/24/2018  . Influenza,inj,Quad PF,6+ Mos 09/19/2014, 09/03/2015, 08/04/2016  . Influenza-Unspecified 08/30/2017  . Pneumococcal Conjugate-13 10/17/2018  . Pneumococcal Polysaccharide-23 10/20/2012  . Tdap 07/06/2011    ROS: Denies dizziness, LOC, polyuria, polydipsia, unintended weight loss/gain, foot ulcerations, numbness or tingling in extremities, shortness of breath or chest pain.  2.  Osteoporosis Patient with known osteoporosis.  This was  diagnosed in 2018.  She was started on Fosamax but unfortunately this medication because greater than $100 per month and she was unable to afford it.  She did not have any problems taking the medication.  She takes vitamin D and calcium OTC.  She tries to maintain a healthy lifestyle and plans to start walking more now that is getting warmer.  ROS: Per HPI  Allergies  Allergen Reactions  . Lipitor [Atorvastatin]   . Morphine And Related    Past Medical History:  Diagnosis Date  . [place]  . Hyperlipidemia   . Hypertension   . Palpitations   . Vertigo     Current Outpatient Medications:  .  amLODipine (NORVASC) 5 MG tablet, Take 1 tablet (5 mg total) by mouth daily., Disp: 90 tablet, Rfl: 3 .  blood glucose meter kit and supplies, Check BS once daily, Disp: 1 each, Rfl: 0 .  busPIRone (BUSPAR) 15 MG tablet, Take 1/2 to 1 tablet every 8 hours for nerves, Disp: 180 tablet, Rfl: 1 .  cyclobenzaprine (FLEXERIL) 5 MG tablet, TAKE 1 TABLET BY MOUTH 3 TIMES A DAY AS NEEDED FOR MUSCLE SPASMS, Disp: 30 tablet, Rfl: 1 .  glipiZIDE (GLUCOTROL XL) 10 MG 24 hr tablet, TAKE 1 TABLET BY MOUTH DAILY **NEEDS OFFICE VISIT FOR NEXT REFILL**, Disp: 30 tablet, Rfl: 0 .  glucose blood (ONETOUCH VERIO) test strip, USE TO TEST BLOOD SUGAR ONCE DAILY Dx E11.9, Disp: 100 each, Rfl: 3 .  hydrochlorothiazide (HYDRODIURIL) 25 MG tablet, TAKE 1 TABLET BY MOUTH EVERY DAY, Disp: 90 tablet, Rfl: 0 .  ondansetron (ZOFRAN ODT) 4 MG disintegrating tablet, Dissolve 1  tablet in mouth every 8 hours as needed for nausea/ vomiting., Disp: 20 tablet, Rfl: 0 .  pravastatin (PRAVACHOL) 40 MG tablet, Take 1 tablet (40 mg total) by mouth daily., Disp: 90 tablet, Rfl: 1 .  sitaGLIPtin-metformin (JANUMET) 50-1000 MG tablet, Take 1 tablet by mouth 2 (two) times daily with a meal. (Needs to be seen before next refill), Disp: 60 tablet, Rfl: 0  Gen: Well-appearing female.  No acute distress.  Alert  and oriented Pulmonary: Normal work of breathing on room air. Psych: Mood stable, speech normal, affect appropriate, pleasant and interactive  Assessment/ Plan: 69 y.o. female   1. Type 2 diabetes mellitus without complication, without long-term current use of insulin (HCC) Likely controlled given fasting BGs.  She will come in for fasting labs. - glipiZIDE (GLUCOTROL XL) 10 MG 24 hr tablet; Take 1 tablet (10 mg total) by mouth daily with breakfast.  Dispense: 90 tablet; Refill: 3 - sitaGLIPtin-metformin (JANUMET) 50-1000 MG tablet; Take 1 tablet by mouth 2 (two) times daily with a meal.  Dispense: 180 tablet; Refill: 3 - CMP14+EGFR; Future - Bayer DCA Hb A1c Waived; Future - Microalbumin / creatinine urine ratio; Future  2. Hypertension associated with diabetes ([place]) - amLODipine (NORVASC) 5 MG tablet; Take 1 tablet (5 mg total) by mouth daily.  Dispense: 90 tablet; Refill: 3 - hydrochlorothiazide (HYDRODIURIL) 25 MG tablet; Take 1 tablet (25 mg total) by mouth daily.  Dispense: 90 tablet; Refill: 3 - CMP14+EGFR; Future  3. Hyperlipidemia associated with type 2 diabetes mellitus (HCC) - pravastatin (PRAVACHOL) 40 MG tablet; Take 1 tablet (40 mg total) by mouth daily.  Dispense: 90 tablet; Refill: 3 - CMP14+EGFR; Future - Lipid panel; Future - TSH; Future  4. Chronic left-sided low back pain with left-sided sciatica Stable. - cyclobenzaprine (FLEXERIL) 5 MG tablet; TAKE 1 TABLET BY MOUTH 3 TIMES A DAY AS NEEDED FOR MUSCLE SPASMS  Dispense: 30 tablet; Refill: 1  5. Age-related osteoporosis without current pathological fracture Will come in for repeat DEXA.  Plan to resume Fosamax pending vitamin D and calcium levels.  We will send this to [facility] for reduced cost. - VITAMIN D 25 Hydroxy (Vit-D Deficiency, Fractures); Future - DG [facility] DEXA; Future  6. Vitamin D deficiency - VITAMIN D 25 Hydroxy (Vit-D Deficiency, Fractures); Future   Start time: 8:03am End time: 8:19am  Total  time spent on patient care (including telephone call/ virtual visit): 28 minutes  [place], [place] [phone]

## 2020-02-08 ENCOUNTER — Other Ambulatory Visit: Payer: Self-pay

## 2020-02-08 ENCOUNTER — Ambulatory Visit (INDEPENDENT_AMBULATORY_CARE_PROVIDER_SITE_OTHER): Payer: Medicare HMO

## 2020-02-08 DIAGNOSIS — M810 Age-related osteoporosis without current pathological fracture: Secondary | ICD-10-CM | POA: Diagnosis not present

## 2020-02-08 LAB — BAYER DCA HB A1C WAIVED: HB A1C (BAYER DCA - WAIVED): 6.7 % (ref <7.0)

## 2020-02-08 NOTE — Addendum Note (Signed)
Addended by: Earlene Plater on: 02/08/2020 08:34 AM   Modules accepted: Orders

## 2020-02-09 LAB — CMP14+EGFR
ALT: 11 IU/L (ref 0–32)
AST: 18 IU/L (ref 0–40)
Albumin/Globulin Ratio: 1.6 (ref 1.2–2.2)
Albumin: 4.2 g/dL (ref 3.8–4.8)
Alkaline Phosphatase: 64 IU/L (ref 39–117)
BUN/Creatinine Ratio: 15 (ref 12–28)
BUN: 14 mg/dL (ref 8–27)
Bilirubin Total: <0.2 mg/dL (ref 0.0–1.2)
CO2: 24 mmol/L (ref 20–29)
Calcium: 9.5 mg/dL (ref 8.7–10.3)
Chloride: 91 mmol/L — ABNORMAL LOW (ref 96–106)
Creatinine, Ser: 0.93 mg/dL (ref 0.57–1.00)
GFR calc Af Amer: 74 mL/min/1.73
GFR calc non Af Amer: 64 mL/min/1.73
Globulin, Total: 2.6 g/dL (ref 1.5–4.5)
Glucose: 93 mg/dL (ref 65–99)
Potassium: 5 mmol/L (ref 3.5–5.2)
Sodium: 131 mmol/L — ABNORMAL LOW (ref 134–144)
Total Protein: 6.8 g/dL (ref 6.0–8.5)

## 2020-02-09 LAB — MICROALBUMIN / CREATININE URINE RATIO
Creatinine, Urine: 70.1 mg/dL
Microalb/Creat Ratio: <4 mg/g{creat} (ref 0–29)
Microalbumin, Urine: <3 ug/mL

## 2020-02-09 LAB — LIPID PANEL
Chol/HDL Ratio: 2.7 ratio (ref 0.0–4.4)
Cholesterol, Total: 152 mg/dL (ref 100–199)
HDL: 56 mg/dL (ref >39)
LDL Chol Calc (NIH): 75 mg/dL (ref 0–99)
Triglycerides: 120 mg/dL (ref 0–149)
VLDL Cholesterol Cal: 21 mg/dL (ref 5–40)

## 2020-02-09 LAB — VITAMIN D 25 HYDROXY (VIT D DEFICIENCY, FRACTURES): Vit D, 25-Hydroxy: 76.8 ng/mL (ref 30.0–100.0)

## 2020-02-09 LAB — TSH: TSH: 3.62 u[IU]/mL (ref 0.450–4.500)

## 2020-02-14 ENCOUNTER — Ambulatory Visit (INDEPENDENT_AMBULATORY_CARE_PROVIDER_SITE_OTHER): Payer: Medicare HMO | Admitting: *Deleted

## 2020-02-14 DIAGNOSIS — Z0000 Encounter for general adult medical examination without abnormal findings: Secondary | ICD-10-CM

## 2020-02-14 NOTE — Progress Notes (Signed)
MEDICARE ANNUAL WELLNESS VISIT  02/14/2020  Telephone Visit Disclaimer This Medicare AWV was conducted by telephone due to national recommendations for restrictions regarding the COVID-19 Pandemic (e.g. social distancing).  I verified, using two identifiers, that I am speaking with Erica Evans or their authorized healthcare agent. I discussed the limitations, risks, security, and privacy concerns of performing an evaluation and management service by telephone and the potential availability of an in-person appointment in the future. The patient expressed understanding and agreed to proceed.   Subjective:  Erica Evans is a 69 y.o. female patient of Erica Norlander, DO who had a Medicare Annual Wellness Visit today via telephone. 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 She lives at home with her husband Erica Evans and her grown son. She has 3 children. She is minimally physically active and enjoys bird watching and walking.    She reports that she is socially active and does interact with friends/family regularly.   Patient Care Team: Erica Norlander, DO as PCP - General (Family Medicine)  Advanced Directives 02/14/2020 05/23/2017  Does Patient Have a Medical Advance Directive? Yes Yes  Type of Paramedic of [place];Living will;Out of facility DNR (pink MOST or yellow form) -    Hospital Utilization Over the Past 12 Months: # of hospitalizations or ER visits: 0 # of surgeries: 0  Review of Systems    Patient reports that her overall health is better compared to last year.  History obtained from the patient.  Patient Reported Readings (BP, Pulse, CBG, Weight, etc) none  Pain Assessment Pain : 0-10 Pain Score: 2  Pain Type: Chronic pain Pain Location: Back Pain Orientation: Mid Pain Descriptors / Indicators: Dull Pain Onset: More than a month ago Pain Frequency: Intermittent Pain Relieving Factors: rest, muscle relaxers Effect of Pain  on Daily Activities: none  Pain Relieving Factors: rest, muscle relaxers  Current Medications & Allergies (verified) Allergies as of 02/14/2020      Reactions   Lipitor [atorvastatin]    Morphine And Related       Medication List       Accurate as of February 14, 2020  9:58 AM. If you have any questions, ask your nurse or doctor.        amLODipine 5 MG tablet Commonly known as: NORVASC Take 1 tablet (5 mg total) by mouth daily.   blood glucose meter kit and supplies Check BS once daily   busPIRone 15 MG tablet Commonly known as: BUSPAR Take 1/2 to 1 tablet every 8 hours for nerves   cyclobenzaprine 5 MG tablet Commonly known as: FLEXERIL TAKE 1 TABLET BY MOUTH 3 TIMES A DAY AS NEEDED FOR MUSCLE SPASMS   D-3-5 125 MCG (5000 UT) capsule Generic drug: Cholecalciferol Take 5,000 Units by mouth daily.   glipiZIDE 10 MG 24 hr tablet Commonly known as: GLUCOTROL XL Take 1 tablet (10 mg total) by mouth daily with breakfast.   hydrochlorothiazide 25 MG tablet Commonly known as: HYDRODIURIL Take 1 tablet (25 mg total) by mouth daily.   Janumet 50-1000 MG tablet Generic drug: sitaGLIPtin-metformin Take 1 tablet by mouth 2 (two) times daily with a meal.   OneTouch Verio test strip Generic drug: glucose blood USE TO TEST BLOOD SUGAR ONCE DAILY Dx E11.9   pravastatin 40 MG tablet Commonly known as: PRAVACHOL Take 1 tablet (40 mg total) by mouth daily.       History (reviewed): Past Medical History:  Diagnosis  Date  . Diabetes mellitus without complication ([place])   . Hyperlipidemia   . Hypertension   . Palpitations   . Vertigo    History reviewed. No pertinent surgical history. Family History  Problem Relation Age of Onset  . Coronary artery disease Mother   . Diabetes Mother   . Heart disease Mother   . Coronary artery disease Father   . Heart disease Father   . Diabetes Sister   . Hypertension Sister   . Diabetes Brother   . Diabetes Sister   . Arthritis  Sister   . Hypertension Sister   . Diabetes Sister   . Hypertension Sister   . Hypertension Sister    Social History   Socioeconomic History  . Marital status: Married    Spouse name: Erica Evans  . Number of children: 2  . Years of education: Not on file  . Highest education level: Associate degree: academic program  Occupational History  . Occupation: Control and instrumentation engineer    Comment: retired  Tobacco Use  . Smoking status: Former Smoker    Quit date: 1990    Years since quitting: 31.2  . Smokeless tobacco: Never Used  Substance and Sexual Activity  . Alcohol use: No  . Drug use: No  . Sexual activity: Not on file  Other Topics Concern  . Not on file  Social History Narrative  . Not on file   Social Determinants of Health   Financial Resource Strain:   . Difficulty of Paying Living Expenses:   Food Insecurity:   . Worried About Charity fundraiser in the Last Year:   . Arboriculturist in the Last Year:   Transportation Needs:   . Film/video editor (Medical):   Marland Kitchen Lack of Transportation (Non-Medical):   Physical Activity:   . Days of Exercise per Week:   . Minutes of Exercise per Session:   Stress:   . Feeling of Stress :   Social Connections:   . Frequency of Communication with Friends and Family:   . Frequency of Social Gatherings with Friends and Family:   . Attends Religious Services:   . Active Member of Clubs or Organizations:   . Attends Archivist Meetings:   Marland Kitchen Marital Status:     Activities of Daily Living In your present state of health, do you have any difficulty performing the following activities: 02/14/2020  Hearing? N  Vision? N  Difficulty concentrating or making decisions? Y  Comment sometimes difficulting making decisions, question myself  Walking or climbing stairs? N  Dressing or bathing? N  Doing errands, shopping? N  Preparing Food and eating ? N  Using the Toilet? N  In the past six months, have you accidently leaked urine? Y   Comment one time  Do you have problems with loss of bowel control? N  Managing your Medications? N  Managing your Finances? N  Housekeeping or managing your Housekeeping? Y  Comment needs some help due to back pain  Some recent data might be hidden    Patient Education/ Literacy How often do you need to have someone help you when you read instructions, pamphlets, or other written materials from your doctor or pharmacy?: 1 - Never What is the last grade level you completed in school?: associates degree  Exercise Current Exercise Habits: Home exercise routine, Type of exercise: walking, Time (Minutes): 15, Frequency (Times/Week): 4, Weekly Exercise (Minutes/Week): 60, Intensity: Mild, Exercise limited by: Other - see comments(back pain)  Diet Patient reports consuming 2 meals a day and 2 snack(s) a day Patient reports that her primary diet is: Diabetic Patient reports that she does have regular access to food.   Depression Screen PHQ 2/9 Scores 02/14/2020 07/20/2019 05/28/2019 10/17/2018 12/27/2017 07/11/2017 04/25/2017  PHQ - 2 Score 1 0 0 1 0 2 0  PHQ- 9 Score 5 0 0 1 - 4 -     Fall Risk Fall Risk  02/14/2020 07/20/2019 05/28/2019 10/17/2018 12/27/2017  Falls in the past year? 0 0 0 0 No     Objective:  Erica Evans seemed alert and oriented and she participated appropriately during our telephone visit.  Blood Pressure Weight BMI  BP Readings from Last 3 Encounters:  07/20/19 137/86  05/28/19 140/90  04/23/19 128/60   Wt Readings from Last 3 Encounters:  07/20/19 203 lb (92.1 kg)  05/28/19 223 lb (101.2 kg)  10/17/18 230 lb (104.3 kg)   BMI Readings from Last 1 Encounters:  07/20/19 31.79 kg/m    *Unable to obtain current vital signs, weight, and BMI due to telephone visit type  Hearing/Vision  . [place]onna did not seem to have difficulty with hearing/understanding during the telephone conversation . Reports that she has had a formal eye exam by an eye care professional  within the past year . Reports that she has not had a formal hearing evaluation within the past year *Unable to fully assess hearing and vision during telephone visit type  Cognitive Function: 6CIT Screen 02/14/2020  What Year? 0 points  What month? 0 points  What time? 0 points  Count back from 20 0 points  Months in reverse 0 points  Repeat phrase 0 points  Total Score 0   (Normal:0-7, Significant for Dysfunction: >8)  Normal Cognitive Function Screening: Yes   Immunization & Health Maintenance Record Immunization History  Administered Date(s) Administered  . Fluad Quad(high Dose 65+) 08/15/2019  . Influenza Inj Mdck Quad Pf 09/01/2017  . Influenza Split 09/21/2013  . Influenza, High Dose Seasonal PF 08/24/2018  . Influenza,inj,Quad PF,6+ Mos 09/19/2014, 09/03/2015, 08/04/2016  . Influenza-Unspecified 08/30/2017  . Pneumococcal Conjugate-13 10/17/2018  . Pneumococcal Polysaccharide-23 10/20/2012  . Tdap 07/06/2011    Health Maintenance  Topic Date Due  . PNA vac Low Risk Adult (2 of 2 - PPSV23) 10/18/2019  . MAMMOGRAM  12/29/2019  . FOOT EXAM  05/27/2020  . INFLUENZA VACCINE  06/15/2020  . OPHTHALMOLOGY EXAM  08/01/2020  . HEMOGLOBIN A1C  08/10/2020  . URINE MICROALBUMIN  02/07/2021  . TETANUS/TDAP  07/05/2021  . Fecal DNA (Cologuard)  10/20/2021  . DEXA SCAN  Completed  . Hepatitis C Screening  Completed       Assessment  This is a routine wellness examination for Erica Evans.  Health Maintenance: Due or Overdue Health Maintenance Due  Topic Date Due  . PNA vac Low Risk Adult (2 of 2 - PPSV23) 10/18/2019  . MAMMOGRAM  12/29/2019    Erica Evans does not need a referral for [employer] Assistance: Care Management:   no Social Work:    no Prescription Assistance:  no Nutrition/Diabetes Education:  no   Plan:  Personalized Goals Goals Addressed            This Visit's Progress   . Patient Stated       02/14/2020 AWV Goal:  Exercise for General Health   Patient will verbalize understanding of the benefits of increased physical activity:  Exercising regularly  is important. It will improve your overall fitness, flexibility, and endurance.  Regular exercise also will improve your overall health. It can help you control your weight, reduce stress, and improve your bone density.  Over the next year, patient will increase physical activity as tolerated with a goal of at least 150 minutes of moderate physical activity per week.   You can tell that you are exercising at a moderate intensity if your heart starts beating faster and you start breathing faster but can still hold a conversation.  Moderate-intensity exercise ideas include:  Walking 1 mile (1.6 km) in about 15 minutes  Biking  Hiking  Golfing  Dancing  Water aerobics  Patient will verbalize understanding of everyday activities that increase physical activity by providing examples like the following: ? Yard work, such as: ? Pushing a Conservation officer, nature ? Raking and bagging leaves ? Washing your car ? Pushing a stroller ? Shoveling snow ? Gardening ? Washing windows or floors  Patient will be able to explain general safety guidelines for exercising:   Before you start a new exercise program, talk with your health care provider.  Do not exercise so much that you hurt yourself, feel dizzy, or get very short of breath.  Wear comfortable clothes and wear shoes with good support.  Drink plenty of water while you exercise to prevent dehydration or heat stroke.  Work out until your breathing and your heartbeat get faster.       Personalized Health Maintenance & Screening Recommendations  Pneumococcal vaccine  , Mammogram  Lung Cancer Screening Recommended: no (Low Dose CT Chest recommended if Age 54-80 years, 30 pack-year currently smoking OR have quit w/in past 15 years) Hepatitis C Screening recommended: no HIV Screening recommended: no   Advanced Directives: Written information was not prepared per patient's request.  Referrals & Orders No orders of the defined types were placed in this encounter.   Follow-up Plan . Follow-up with Erica Norlander, DO as planned . Schedule mammogram .    I have personally reviewed and noted the following in the patient's chart:   . Medical and social history . Use of alcohol, tobacco or illicit drugs  . Current medications and supplements . Functional ability and status . Nutritional status . Physical activity . Advanced directives . List of other physicians . Hospitalizations, surgeries, and ER visits in previous 12 months . Vitals . Screenings to include cognitive, depression, and falls . Referrals and appointments  In addition, I have reviewed and discussed with Erica Evans certain preventive protocols, quality metrics, and best practice recommendations. A written personalized care plan for preventive services as well as general preventive health recommendations is available and can be mailed to the patient at her request.      Baldomero Lamy, LPN 06/22/4165

## 2020-02-26 ENCOUNTER — Telehealth: Payer: Self-pay | Admitting: *Deleted

## 2020-02-26 ENCOUNTER — Other Ambulatory Visit: Payer: Self-pay | Admitting: Family Medicine

## 2020-02-26 DIAGNOSIS — G8929 Other chronic pain: Secondary | ICD-10-CM

## 2020-02-26 DIAGNOSIS — M5442 Lumbago with sciatica, left side: Secondary | ICD-10-CM

## 2020-02-26 MED ORDER — CYCLOBENZAPRINE HCL 5 MG PO TABS: ORAL_TABLET | ORAL | 1 refills | Status: DC

## 2020-02-26 NOTE — Telephone Encounter (Signed)
Insurance has denied the cyclobenzaprine 5 mg  - Per pharm recommendation - can resend for BID, instead of TID - they may approve that.

## 2020-02-26 NOTE — Telephone Encounter (Signed)
Okay to send twice daily instead of 3 times daily.  Please inform patient of insurance restrictions.

## 2020-02-26 NOTE — Telephone Encounter (Signed)
Sent in BID - NA no VM for pt

## 2020-05-04 ENCOUNTER — Other Ambulatory Visit: Payer: Self-pay | Admitting: Family Medicine

## 2020-08-29 ENCOUNTER — Other Ambulatory Visit: Payer: Self-pay

## 2020-08-29 ENCOUNTER — Ambulatory Visit (INDEPENDENT_AMBULATORY_CARE_PROVIDER_SITE_OTHER): Payer: Medicare HMO

## 2020-08-29 DIAGNOSIS — Z23 Encounter for immunization: Secondary | ICD-10-CM | POA: Diagnosis not present

## 2020-09-01 ENCOUNTER — Other Ambulatory Visit (HOSPITAL_COMMUNITY): Payer: Self-pay | Admitting: Family Medicine

## 2020-09-01 DIAGNOSIS — Z1231 Encounter for screening mammogram for malignant neoplasm of breast: Secondary | ICD-10-CM

## 2020-09-04 ENCOUNTER — Ambulatory Visit (HOSPITAL_COMMUNITY)
Admission: RE | Admit: 2020-09-04 | Discharge: 2020-09-04 | Disposition: A | Discharge status: Home / Self Care | Payer: Medicare HMO | Source: Ambulatory Visit | Attending: Family Medicine | Admitting: Family Medicine

## 2020-09-04 ENCOUNTER — Other Ambulatory Visit: Payer: Self-pay

## 2020-09-04 DIAGNOSIS — Z1231 Encounter for screening mammogram for malignant neoplasm of breast: Secondary | ICD-10-CM | POA: Diagnosis not present

## 2020-11-01 ENCOUNTER — Other Ambulatory Visit: Payer: Self-pay | Admitting: Family Medicine

## 2020-11-01 ENCOUNTER — Encounter: Payer: Self-pay | Admitting: Family Medicine

## 2020-11-01 DIAGNOSIS — M5442 Lumbago with sciatica, left side: Secondary | ICD-10-CM

## 2020-11-01 DIAGNOSIS — G8929 Other chronic pain: Secondary | ICD-10-CM

## 2020-11-01 DIAGNOSIS — F411 Generalized anxiety disorder: Secondary | ICD-10-CM

## 2020-11-04 ENCOUNTER — Other Ambulatory Visit: Payer: Self-pay | Admitting: Family Medicine

## 2020-11-04 ENCOUNTER — Telehealth: Payer: Self-pay | Admitting: *Deleted

## 2020-11-04 DIAGNOSIS — F411 Generalized anxiety disorder: Secondary | ICD-10-CM

## 2020-11-04 DIAGNOSIS — G8929 Other chronic pain: Secondary | ICD-10-CM

## 2020-11-04 MED ORDER — CYCLOBENZAPRINE HCL 5 MG PO TABS: ORAL_TABLET | ORAL | 0 refills | Status: DC

## 2020-11-04 MED ORDER — BUSPIRONE HCL 15 MG PO TABS: ORAL_TABLET | ORAL | 0 refills | Status: DC

## 2020-11-15 DIAGNOSIS — U071 COVID-19: Secondary | ICD-10-CM

## 2020-11-15 HISTORY — DX: COVID-19: U07.1

## 2020-11-26 ENCOUNTER — Other Ambulatory Visit: Payer: Self-pay | Admitting: Family Medicine

## 2020-11-26 DIAGNOSIS — F411 Generalized anxiety disorder: Secondary | ICD-10-CM

## 2020-12-04 ENCOUNTER — Other Ambulatory Visit: Payer: Self-pay | Admitting: Family Medicine

## 2020-12-04 DIAGNOSIS — E119 Type 2 diabetes mellitus without complications: Secondary | ICD-10-CM

## 2020-12-04 DIAGNOSIS — F411 Generalized anxiety disorder: Secondary | ICD-10-CM

## 2020-12-14 ENCOUNTER — Other Ambulatory Visit: Payer: Self-pay | Admitting: Family Medicine

## 2020-12-14 DIAGNOSIS — G8929 Other chronic pain: Secondary | ICD-10-CM

## 2020-12-14 DIAGNOSIS — F411 Generalized anxiety disorder: Secondary | ICD-10-CM

## 2021-01-12 ENCOUNTER — Other Ambulatory Visit: Payer: Self-pay | Admitting: Family Medicine

## 2021-01-12 DIAGNOSIS — G8929 Other chronic pain: Secondary | ICD-10-CM

## 2021-01-12 DIAGNOSIS — I152 Hypertension secondary to endocrine disorders: Secondary | ICD-10-CM

## 2021-01-12 DIAGNOSIS — E1159 Type 2 diabetes mellitus with other circulatory complications: Secondary | ICD-10-CM

## 2021-01-31 ENCOUNTER — Other Ambulatory Visit: Payer: Self-pay | Admitting: Family Medicine

## 2021-01-31 DIAGNOSIS — G8929 Other chronic pain: Secondary | ICD-10-CM

## 2021-02-02 ENCOUNTER — Other Ambulatory Visit: Payer: Self-pay | Admitting: Family Medicine

## 2021-02-02 DIAGNOSIS — F411 Generalized anxiety disorder: Secondary | ICD-10-CM

## 2021-02-02 DIAGNOSIS — E119 Type 2 diabetes mellitus without complications: Secondary | ICD-10-CM

## 2021-02-02 DIAGNOSIS — G8929 Other chronic pain: Secondary | ICD-10-CM

## 2021-02-13 ENCOUNTER — Other Ambulatory Visit: Payer: Self-pay

## 2021-02-16 ENCOUNTER — Other Ambulatory Visit: Payer: Self-pay | Admitting: Family Medicine

## 2021-02-16 ENCOUNTER — Ambulatory Visit (INDEPENDENT_AMBULATORY_CARE_PROVIDER_SITE_OTHER): Payer: Medicare HMO

## 2021-02-16 VITALS — Ht 67.0 in | Wt 216.0 lb

## 2021-02-16 DIAGNOSIS — E119 Type 2 diabetes mellitus without complications: Secondary | ICD-10-CM

## 2021-02-16 DIAGNOSIS — E785 Hyperlipidemia, unspecified: Secondary | ICD-10-CM

## 2021-02-16 DIAGNOSIS — E1169 Type 2 diabetes mellitus with other specified complication: Secondary | ICD-10-CM

## 2021-02-16 DIAGNOSIS — Z0000 Encounter for general adult medical examination without abnormal findings: Secondary | ICD-10-CM | POA: Diagnosis not present

## 2021-02-16 NOTE — Progress Notes (Signed)
Subjective:   Tynlee Bayle is a 70 y.o. female who presents for Medicare Annual (Subsequent) preventive examination.  Virtual Visit via Telephone Note  I connected with  [name] on 02/16/21 at  9:45 AM EDT by telephone and verified that I am speaking with the correct person using two identifiers.  Location: Patient: Home Provider: [facility] Persons participating in the virtual visit: patient/Nurse Health Advisor   I discussed the limitations, risks, security and privacy concerns of performing an evaluation and management service by telephone and the availability of in person appointments. The patient expressed understanding and agreed to proceed.  Interactive audio and video telecommunications were attempted between this nurse and patient, however failed, due to patient having technical difficulties OR patient did not have access to video capability.  We continued and completed visit with audio only.  Some vital signs may be absent or patient reported.   Ranata Laughery E Adib Wahba, LPN   Review of Systems     Cardiac Risk Factors include: advanced age (>36mn, >>38women);diabetes mellitus;dyslipidemia;hypertension;obesity (BMI >30kg/m2);sedentary lifestyle;family history of premature cardiovascular disease     Objective:    Today's Vitals   02/16/21 0923 02/16/21 0925  Weight:  216 lb (98 kg)  Height:  _0  (1.702 m)  PainSc: 3  3   PainLoc:  Back   Body mass index is 33.83 kg/m.  Advanced Directives 02/16/2021 02/14/2020 05/23/2017  Does Patient Have a Medical Advance Directive? Yes Yes Yes  Type of [place];Out of facility DNR (pink MOST or yellow form) -  Copy of HParcin Chart? No - copy requested - -    Current Medications (verified) Outpatient Encounter Medications as of 02/16/2021  Medication Sig  . amLODipine (NORVASC) 5 MG tablet Take 1 tablet  (5 mg total) by mouth daily. (NEEDS TO BE SEEN BEFORE NEXT REFILL)  . blood glucose meter kit and supplies Check BS once daily  . busPIRone (BUSPAR) 15 MG tablet Take 1/2 to 1 tablet every 8 hours for nerves  . Cholecalciferol (D-3-5) 125 MCG (5000 UT) capsule Take 5,000 Units by mouth daily.  . cyclobenzaprine (FLEXERIL) 5 MG tablet TAKE 1 TABLET BY MOUTH 2 TIMES A DAY AS NEEDED FOR MUSCLE SPASMS  . glipiZIDE (GLUCOTROL XL) 10 MG 24 hr tablet Take 1 tablet (10 mg total) by mouth daily with breakfast. (Needs to be seen before next refill  . hydrochlorothiazide (HYDRODIURIL) 25 MG tablet Take 1 tablet (25 mg total) by mouth daily. (NEEDS TO BE SEEN BEFORE NEXT REFILL)  . JANUMET 50-1000 MG tablet TAKE 1 TABLET BY MOUTH 2 (TWO) TIMES DAILY WITH A MEAL.  .Marland KitchenONETOUCH VERIO test strip USE TO TEST BLOOD SUGAR ONCE DAILY DX E11.9  . pravastatin (PRAVACHOL) 40 MG tablet Take 1 tablet (40 mg total) by mouth daily.  . celecoxib (CELEBREX) 200 MG capsule Take 200 mg by mouth daily as needed. (Patient not taking: Reported on 02/16/2021)   No facility-administered encounter medications on file as of 02/16/2021.    Allergies (verified) Lipitor [atorvastatin] and Morphine and related   History: Past Medical History:  Diagnosis Date  . Diabetes mellitus without complication ([place]   . Hyperlipidemia   . Hypertension   . Palpitations   . Vertigo    History reviewed. No pertinent surgical history. Family History  Problem Relation Age of Onset  . Coronary artery disease Mother   .  Diabetes Mother   . Heart disease Mother   . Coronary artery disease Father   . Heart disease Father   . Diabetes Sister   . Hypertension Sister   . Heart disease Sister   . Diabetes Brother   . Diabetes Sister   . Arthritis Sister   . Hypertension Sister   . Diabetes Sister   . Hypertension Sister   . Hypertension Sister    Social History   Socioeconomic History  . Marital status: Married    Spouse name: [name]  .  Number of children: 2  . Years of education: Not on file  . Highest education level: Associate degree: academic program  Occupational History  . Occupation: Control and instrumentation engineer    Comment: retired  Tobacco Use  . Smoking status: Former Smoker    Quit date: 1990    Years since quitting: 32.2  . Smokeless tobacco: Never Used  Vaping Use  . Vaping Use: Never used  Substance and Sexual Activity  . Alcohol use: No  . Drug use: No  . Sexual activity: Not on file  Other Topics Concern  . 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 She lives at home with her husband [name] and her grown son. She has 3 children. She enjoys bird watching and walking.    Social Determinants of Health   Financial Resource Strain: Low Risk   . Difficulty of Paying Living Expenses: Not hard at all  Food Insecurity: No Food Insecurity  . Worried About Charity fundraiser in the Last Year: Never true  . Ran Out of Food in the Last Year: Never true  Transportation Needs: No Transportation Needs  . Lack of Transportation (Medical): No  . Lack of Transportation (Non-Medical): No  Physical Activity: Inactive  . Days of Exercise per Week: 0 days  . Minutes of Exercise per Session: 0 min  Stress: Stress Concern Present  . Feeling of Stress : To some extent  Social Connections: Socially Integrated  . Frequency of Communication with Friends and Family: More than three times a week  . Frequency of Social Gatherings with Friends and Family: Once a week  . Attends Religious Services: More than 4 times per year  . Active Member of Clubs or Organizations: Yes  . Attends Archivist Meetings: More than 4 times per year  . Marital Status: Married    Tobacco Counseling Counseling given: Not Answered   Clinical Intake:  Pre-visit preparation completed: Yes  Pain : 0-10 Pain Score: 3  Pain Type: Chronic pain Pain Location: Back Pain Descriptors / Indicators:  Aching Pain Onset: More than a month ago Pain Frequency: Intermittent     BMI - recorded: 33.83 Nutritional Status: BMI > 30  Obese Nutritional Risks: Unintentional weight gain Diabetes: Yes CBG done?: No Did pt. bring in CBG monitor from home?: No  How often do you need to have someone help you when you read instructions, pamphlets, or other written materials from your doctor or pharmacy?: 1 - Never  Nutrition Risk Assessment:  Has the patient had any N/V/D within the last 2 months?  No  Does the patient have any non-healing wounds?  No  Has the patient had any unintentional weight loss or weight gain?  Yes   Diabetes:  Is the patient diabetic?  Yes  If diabetic, was a CBG obtained today?  No  Did the patient bring in their glucometer from home?  No  How often do you monitor your CBG's? Fasting daily - today 137 at home per patient.   Financial Strains and Diabetes Management:  Are you having any financial strains with the device, your supplies or your medication? No .  Does the patient want to be seen by Chronic Care Management for management of their diabetes?  No  Would the patient like to be referred to a Nutritionist or for Diabetic Management?  No   Diabetic Exams:  Diabetic Eye Exam: Completed 2020. Overdue for diabetic eye exam. Pt has been advised about the importance in completing this exam.  Diabetic Foot Exam: Completed 2020. Pt has been advised about the importance in completing this exam. Pt is scheduled for diabetic foot exam on 03/11/2021.    Interpreter Needed?: No  Information entered by :: Marium Ragan, LPN   Activities of Daily Living In your present state of health, do you have any difficulty performing the following activities: 02/16/2021  Hearing? N  Vision? N  Difficulty concentrating or making decisions? N  Walking or climbing stairs? Y  Comment she can do this holding on  Dressing or bathing? N  Doing errands, shopping? N  Preparing Food  and eating ? N  Using the Toilet? N  In the past six months, have you accidently leaked urine? Y  Comment usually just at night - she is going to buy some pads  Do you have problems with loss of bowel control? N  Managing your Medications? N  Managing your Finances? N  Housekeeping or managing your Housekeeping? N  Some recent data might be hidden    Patient Care Team: [name], DO as PCP - General (Family Medicine) Sandford Craze, MD as Referring Physician (Dermatology) Leticia Clas, OD (Optometry)  Indicate any recent Medical Services you may have received from other than [facility] providers in the past year (date may be approximate).     Assessment:   This is a routine wellness examination for Carly.  Hearing/Vision screen  Hearing Screening   _0  _1  _2  _3  _4  _5  _6  _7  _8   Right ear:           Left ear:           Comments: No complaints of hearing loss  Vision Screening Comments: Annual visits with Dr [facility]ana [facility], [place] - past due for exam  Dietary issues and exercise activities discussed: Current Exercise Habits: The patient does not participate in regular exercise at present, Exercise limited by: orthopedic condition(s)  Goals    . Patient Stated     02/14/2020 AWV Goal: Exercise for General Health   Patient will verbalize understanding of the benefits of increased physical activity:  Exercising regularly is important. It will improve your overall fitness, flexibility, and endurance.  Regular exercise also will improve your overall health. It can help you control your weight, reduce stress, and improve your bone density.  Over the next year, patient will increase physical activity as tolerated with a goal of at least 150 minutes of moderate physical activity per week.   You can tell that you are exercising at a moderate intensity if your heart starts beating faster and you start breathing faster but can still  hold a conversation.  Moderate-intensity exercise ideas include:  Walking 1 mile (1.6 km) in about 15 minutes  Biking  Hiking  Golfing  Dancing  Water aerobics  Patient will verbalize understanding of everyday activities that increase physical activity by  providing examples like the following: ? Yard work, such as: ? Pushing a Conservation officer, nature ? Raking and bagging leaves ? Washing your car ? Pushing a stroller ? Shoveling snow ? Gardening ? Washing windows or floors  Patient will be able to explain general safety guidelines for exercising:   Before you start a new exercise program, talk with your health care provider.  Do not exercise so much that you hurt yourself, feel dizzy, or get very short of breath.  Wear comfortable clothes and wear shoes with good support.  Drink plenty of water while you exercise to prevent dehydration or heat stroke.  Work out until your breathing and your heartbeat get faster.     . Patient Stated     Wants to get back on her diet and get sugar back under control and reduce pain      Depression Screen PHQ 2/9 Scores 02/16/2021 02/14/2020 07/20/2019 05/28/2019 10/17/2018 12/27/2017 07/11/2017  PHQ - 2 Score 0 1 0 0 1 0 2  PHQ- 9 Score - 5 0 0 1 - 4    Fall Risk Fall Risk  02/16/2021 02/14/2020 07/20/2019 05/28/2019 10/17/2018  Falls in the past year? 0 0 0 0 0  Number falls in past yr: 0 - - - -  Injury with Fall? 0 - - - -  Risk for fall due to : Orthopedic patient - - - -    [place]:  Any stairs in or around the home? Yes  If so, are there any without handrails? No  Home free of loose throw rugs in walkways, pet beds, electrical cords, etc? Yes  Adequate lighting in your home to reduce risk of falls? Yes   ASSISTIVE DEVICES UTILIZED TO PREVENT FALLS:  Life alert? No  Use of a cane, walker or w/c? No  Grab bars in the bathroom? Yes  Shower chair or bench in shower? No  Elevated toilet seat or a handicapped  toilet? Yes   TIMED UP AND GO:  Was the test performed? No . Telephonic visit.  Cognitive Function:     6CIT Screen 02/16/2021 02/14/2020  What Year? 0 points 0 points  What month? 0 points 0 points  What time? 0 points 0 points  Count back from 20 0 points 0 points  Months in reverse 0 points 0 points  Repeat phrase 0 points 0 points  Total Score 0 0    Immunizations Immunization History  Administered Date(s) Administered  . Fluad Quad(high Dose 65+) 08/15/2019, 08/29/2020  . Influenza Inj Mdck Quad Pf 09/01/2017  . Influenza Split 09/21/2013  . Influenza, High Dose Seasonal PF 08/24/2018  . Influenza,inj,Quad PF,6+ Mos 09/19/2014, 09/03/2015, 08/04/2016  . Influenza-Unspecified 08/30/2017  . Moderna Sars-Covid-2 Vaccination 01/09/2020, 02/06/2020, 10/01/2020  . Pneumococcal Conjugate-13 10/20/2012, 10/17/2018  . Pneumococcal Polysaccharide-23 10/20/2012  . Tdap 07/06/2011    TDAP status: Up to date  Flu Vaccine status: Up to date  Pneumococcal vaccine status: Up to date  Covid-19 vaccine status: Completed vaccines  Qualifies for Shingles Vaccine? Yes   Zostavax completed No   Shingrix Completed?: No.    Education has been provided regarding the importance of this vaccine. Patient has been advised to call insurance company to determine out of pocket expense if they have not yet received this vaccine. Advised may also receive vaccine at local pharmacy or Health Dept. Verbalized acceptance and understanding.  Screening Tests Health Maintenance  Topic Date Due  . FOOT EXAM  05/27/2020  . OPHTHALMOLOGY EXAM  08/01/2020  . HEMOGLOBIN A1C  08/10/2020  . URINE MICROALBUMIN  02/07/2021  . INFLUENZA VACCINE  06/15/2021  . TETANUS/TDAP  07/05/2021  . Fecal DNA (Cologuard)  10/20/2021  . MAMMOGRAM  09/04/2022  . DEXA SCAN  Completed  . COVID-19 Vaccine  Completed  . Hepatitis C Screening  Completed  . HPV VACCINES  Aged Out  . PNA vac Low Risk Adult  Discontinued     Health Maintenance  Health Maintenance Due  Topic Date Due  . FOOT EXAM  05/27/2020  . OPHTHALMOLOGY EXAM  08/01/2020  . HEMOGLOBIN A1C  08/10/2020  . URINE MICROALBUMIN  02/07/2021    Colorectal cancer screening: Type of screening: Cologuard. Completed 10/20/2018. Repeat every 3 years  Mammogram status: Completed 09/04/2020. Repeat every year  Bone Density status: Completed 02/08/2020. Results reflect: Bone density results: OSTEOPOROSIS. Repeat every 2 years.  Lung Cancer Screening: (Low Dose CT Chest recommended if Age 16-80 years, 30 pack-year currently smoking OR have quit w/in 15years.) does not qualify.   Additional Screening:  Hepatitis C Screening: does qualify; Completed 06/26/2007  Vision Screening: Recommended annual ophthalmology exams for early detection of glaucoma and other disorders of the eye. Is the patient up to date with their annual eye exam?  No  Who is the provider or what is the name of the office in which the patient attends annual eye exams? Dr Antonietta Breach, [place] If pt is not established with a provider, would they like to be referred to a provider to establish care? No .   Dental Screening: Recommended annual dental exams for proper oral hygiene  Community Resource Referral / Chronic Care Management: CRR required this visit?  No   CCM required this visit?  No      Plan:     I have personally reviewed and noted the following in the patient's chart:   . Medical and social history . Use of alcohol, tobacco or illicit drugs  . Current medications and supplements . Functional ability and status . Nutritional status . Physical activity . Advanced directives . List of other physicians . Hospitalizations, surgeries, and ER visits in previous 12 months . Vitals . Screenings to include cognitive, depression, and falls . Referrals and appointments  In addition, I have reviewed and discussed with patient certain preventive protocols, quality  metrics, and best practice recommendations. A written personalized care plan for preventive services as well as general preventive health recommendations were provided to patient.     Sandrea Hammond, LPN   06/18/2102   Nurse Notes: Discussed need for Shingrix - she would like to discuss with Dr Lajuana Ripple first - she never had chicken pox and her husband recently had shingles and she never got it.

## 2021-02-16 NOTE — Patient Instructions (Signed)
[name] , Thank you for taking time to come for your Medicare Wellness Visit. I appreciate your ongoing commitment to your health goals. Please review the following plan we discussed and let me know if I can assist you in the future.   Screening recommendations/referrals: Colonoscopy: Cologuard done 10/20/2018 - repeat 10/2021 Mammogram: Done 09/04/2020 - repeat annually Bone Density: Done 02/08/2020 - repeat in 2 years Recommended yearly ophthalmology/optometry visit for glaucoma screening and checkup Recommended yearly dental visit for hygiene and checkup  Vaccinations: Influenza vaccine: Done 08/29/2020 - repeat annually Pneumococcal vaccine: Done 10/20/2012 & 10/17/2018 Tdap vaccine: Done 07/06/2011 - repeat 10 years Shingles vaccine: Shingrix discussed. Please contact your pharmacy for coverage information.    Covid-19: Complete 01/09/20, 02/06/20, & 10/11/20  Advanced directives: Please bring a copy of your health care power of attorney and living will to the office to be added to your chart at your convenience.  Conditions/risks identified: Work on increasing physical activity and eating better for your diabetes.  Next appointment: Follow up in one year for your annual wellness visit    Preventive Care 65 Years and Older, Female Preventive care refers to lifestyle choices and visits with your health care provider that can promote health and wellness. What does preventive care include?  A yearly physical exam. This is also called an annual well check.  Dental exams once or twice a year.  Routine eye exams. Ask your health care provider how often you should have your eyes checked.  Personal lifestyle choices, including:  Daily care of your teeth and gums.  Regular physical activity.  Eating a healthy diet.  Avoiding tobacco and drug use.  Limiting alcohol use.  Practicing safe sex.  Taking low-dose aspirin every day.  Taking vitamin and mineral supplements  as recommended by your health care provider. What happens during an annual well check? The services and screenings done by your health care provider during your annual well check will depend on your age, overall health, lifestyle risk factors, and family history of disease. Counseling  Your health care provider may ask you questions about your:  Alcohol use.  Tobacco use.  Drug use.  Emotional well-being.  Home and relationship well-being.  Sexual activity.  Eating habits.  History of falls.  Memory and ability to understand (cognition).  Work and work Statistician.  Reproductive health. Screening  You may have the following tests or measurements:  Height, weight, and BMI.  Blood pressure.  Lipid and cholesterol levels. These may be checked every 5 years, or more frequently if you are over 15 years old.  Skin check.  Lung cancer screening. You may have this screening every year starting at age 58 if you have a 30-pack-year history of smoking and currently smoke or have quit within the past 15 years.  Fecal occult blood test (FOBT) of the stool. You may have this test every year starting at age 5.  Flexible sigmoidoscopy or colonoscopy. You may have a sigmoidoscopy every 5 years or a colonoscopy every 10 years starting at age 67.  Hepatitis C blood test.  Hepatitis B blood test.  Sexually transmitted disease (STD) testing.  Diabetes screening. This is done by checking your blood sugar (glucose) after you have not eaten for a while (fasting). You may have this done every 1-3 years.  Bone density scan. This is done to screen for osteoporosis. You may have this done starting at age 34.  Mammogram. This may be done every 1-2 years. Talk  to your health care provider about how often you should have regular mammograms. Talk with your health care provider about your test results, treatment options, and if necessary, the need for more tests. Vaccines  Your health care  provider may recommend certain vaccines, such as:  Influenza vaccine. This is recommended every year.  Tetanus, diphtheria, and acellular pertussis (Tdap, Td) vaccine. You may need a Td booster every 10 years.  Zoster vaccine. You may need this after age 65.  Pneumococcal 13-valent conjugate (PCV13) vaccine. One dose is recommended after age 48.  Pneumococcal polysaccharide (PPSV23) vaccine. One dose is recommended after age 39. Talk to your health care provider about which screenings and vaccines you need and how often you need them. This information is not intended to replace advice given to you by your health care provider. Make sure you discuss any questions you have with your health care provider. Document Released: 11/28/2015 Document Revised: 07/21/2016 Document Reviewed: 09/02/2015 Elsevier Interactive Patient Education  2017 [employer] Prevention in the Home Falls can cause injuries. They can happen to people of all ages. There are many things you can do to make your home safe and to help prevent falls. What can I do on the outside of my home?  Regularly fix the edges of walkways and driveways and fix any cracks.  Remove anything that might make you trip as you walk through a door, such as a raised step or threshold.  Trim any bushes or trees on the path to your home.  Use bright outdoor lighting.  Clear any walking paths of anything that might make someone trip, such as rocks or tools.  Regularly check to see if handrails are loose or broken. Make sure that both sides of any steps have handrails.  Any raised decks and porches should have guardrails on the edges.  Have any leaves, snow, or ice cleared regularly.  Use sand or salt on walking paths during winter.  Clean up any spills in your garage right away. This includes oil or grease spills. What can I do in the bathroom?  Use night lights.  Install grab bars by the toilet and in the tub and shower. Do  not use towel bars as grab bars.  Use non-skid mats or decals in the tub or shower.  If you need to sit down in the shower, use a plastic, non-slip stool.  Keep the floor dry. Clean up any water that spills on the floor as soon as it happens.  Remove soap buildup in the tub or shower regularly.  Attach bath mats securely with double-sided non-slip rug tape.  Do not have throw rugs and other things on the floor that can make you trip. What can I do in the bedroom?  Use night lights.  Make sure that you have a light by your bed that is easy to reach.  Do not use any sheets or blankets that are too big for your bed. They should not hang down onto the floor.  Have a firm chair that has side arms. You can use this for support while you get dressed.  Do not have throw rugs and other things on the floor that can make you trip. What can I do in the kitchen?  Clean up any spills right away.  Avoid walking on wet floors.  Keep items that you use a lot in easy-to-reach places.  If you need to reach something above you, use a strong step stool that  has a grab bar.  Keep electrical cords out of the way.  Do not use floor polish or wax that makes floors slippery. If you must use wax, use non-skid floor wax.  Do not have throw rugs and other things on the floor that can make you trip. What can I do with my stairs?  Do not leave any items on the stairs.  Make sure that there are handrails on both sides of the stairs and use them. Fix handrails that are broken or loose. Make sure that handrails are as long as the stairways.  Check any carpeting to make sure that it is firmly attached to the stairs. Fix any carpet that is loose or worn.  Avoid having throw rugs at the top or bottom of the stairs. If you do have throw rugs, attach them to the floor with carpet tape.  Make sure that you have a light switch at the top of the stairs and the bottom of the stairs. If you do not have them,  ask someone to add them for you. What else can I do to help prevent falls?  Wear shoes that:  Do not have high heels.  Have rubber bottoms.  Are comfortable and fit you well.  Are closed at the toe. Do not wear sandals.  If you use a stepladder:  Make sure that it is fully opened. Do not climb a closed stepladder.  Make sure that both sides of the stepladder are locked into place.  Ask someone to hold it for you, if possible.  Clearly mark and make sure that you can see:  Any grab bars or handrails.  First and last steps.  Where the edge of each step is.  Use tools that help you move around (mobility aids) if they are needed. These include:  Canes.  Walkers.  Scooters.  Crutches.  Turn on the lights when you go into a dark area. Replace any light bulbs as soon as they burn out.  Set up your furniture so you have a clear path. Avoid moving your furniture around.  If any of your floors are uneven, fix them.  If there are any pets around you, be aware of where they are.  Review your medicines with your doctor. Some medicines can make you feel dizzy. This can increase your chance of falling. Ask your doctor what other things that you can do to help prevent falls. This information is not intended to replace advice given to you by your health care provider. Make sure you discuss any questions you have with your health care provider. Document Released: 08/28/2009 Document Revised: 04/08/2016 Document Reviewed: 12/06/2014 Elsevier Interactive Patient Education  2017 [employer].

## 2021-02-28 ENCOUNTER — Other Ambulatory Visit: Payer: Self-pay | Admitting: Family Medicine

## 2021-02-28 DIAGNOSIS — I152 Hypertension secondary to endocrine disorders: Secondary | ICD-10-CM

## 2021-02-28 DIAGNOSIS — F411 Generalized anxiety disorder: Secondary | ICD-10-CM

## 2021-03-02 ENCOUNTER — Other Ambulatory Visit: Payer: Self-pay | Admitting: Family Medicine

## 2021-03-02 DIAGNOSIS — E1169 Type 2 diabetes mellitus with other specified complication: Secondary | ICD-10-CM

## 2021-03-02 DIAGNOSIS — E785 Hyperlipidemia, unspecified: Secondary | ICD-10-CM

## 2021-03-11 ENCOUNTER — Encounter: Payer: Self-pay | Admitting: Family Medicine

## 2021-03-11 ENCOUNTER — Ambulatory Visit: Payer: Medicare HMO | Admitting: Family Medicine

## 2021-03-11 ENCOUNTER — Other Ambulatory Visit: Payer: Self-pay

## 2021-03-11 VITALS — BP 171/84 | HR 87 | Temp 98.2°F | Ht 67.0 in | Wt 218.6 lb

## 2021-03-11 DIAGNOSIS — M5442 Lumbago with sciatica, left side: Secondary | ICD-10-CM

## 2021-03-11 DIAGNOSIS — E1159 Type 2 diabetes mellitus with other circulatory complications: Secondary | ICD-10-CM

## 2021-03-11 DIAGNOSIS — G8929 Other chronic pain: Secondary | ICD-10-CM

## 2021-03-11 DIAGNOSIS — E559 Vitamin D deficiency, unspecified: Secondary | ICD-10-CM

## 2021-03-11 DIAGNOSIS — E1169 Type 2 diabetes mellitus with other specified complication: Secondary | ICD-10-CM | POA: Diagnosis not present

## 2021-03-11 DIAGNOSIS — F411 Generalized anxiety disorder: Secondary | ICD-10-CM

## 2021-03-11 DIAGNOSIS — M810 Age-related osteoporosis without current pathological fracture: Secondary | ICD-10-CM

## 2021-03-11 DIAGNOSIS — E1165 Type 2 diabetes mellitus with hyperglycemia: Secondary | ICD-10-CM

## 2021-03-11 DIAGNOSIS — E785 Hyperlipidemia, unspecified: Secondary | ICD-10-CM

## 2021-03-11 DIAGNOSIS — I152 Hypertension secondary to endocrine disorders: Secondary | ICD-10-CM

## 2021-03-11 LAB — BAYER DCA HB A1C WAIVED: HB A1C (BAYER DCA - WAIVED): 8.3 % — ABNORMAL HIGH (ref <7.0)

## 2021-03-11 MED ORDER — JANUMET 50-1000 MG PO TABS: 1.0000 | ORAL_TABLET | Freq: Two times a day (BID) | ORAL | 0 refills | Status: DC

## 2021-03-11 MED ORDER — AMLODIPINE BESYLATE 10 MG PO TABS: 10.0000 mg | ORAL_TABLET | Freq: Every day | ORAL | 3 refills | Status: DC

## 2021-03-11 MED ORDER — HYDROCHLOROTHIAZIDE 25 MG PO TABS: 25.0000 mg | ORAL_TABLET | Freq: Every day | ORAL | 3 refills | Status: DC

## 2021-03-11 MED ORDER — EMPAGLIFLOZIN 10 MG PO TABS: 10.0000 mg | ORAL_TABLET | Freq: Every day | ORAL | 0 refills | Status: DC

## 2021-03-11 MED ORDER — BUSPIRONE HCL 15 MG PO TABS
ORAL_TABLET | ORAL | 0 refills | Status: DC
Start: 2021-03-11 — End: 2021-03-30

## 2021-03-11 MED ORDER — PRAVASTATIN SODIUM 40 MG PO TABS: 40.0000 mg | ORAL_TABLET | Freq: Every day | ORAL | 3 refills | Status: DC

## 2021-03-11 MED ORDER — GLIPIZIDE ER 10 MG PO TB24: 10.0000 mg | ORAL_TABLET | Freq: Every day | ORAL | 3 refills | Status: DC

## 2021-03-11 MED ORDER — CELECOXIB 200 MG PO CAPS: 200.0000 mg | ORAL_CAPSULE | Freq: Every day | ORAL | 1 refills | Status: DC | PRN

## 2021-03-11 MED ORDER — CYCLOBENZAPRINE HCL 10 MG PO TABS: 5.0000 mg | ORAL_TABLET | Freq: Three times a day (TID) | ORAL | 0 refills | Status: DC | PRN

## 2021-03-11 NOTE — Patient Instructions (Signed)
Your amlodipine has been increased to 10 mg daily since your blood pressure is so high  I have given you a 1 month supply of Jardiance.  Your blood sugar was too high today despite the fact that you take glipizide and Janumet.  I would like you to see Almyra Free in 3 weeks for recheck.  Please make sure that you are monitoring her blood sugars during this time.  Bring that log to the office  Your medications otherwise remain the same

## 2021-03-11 NOTE — Progress Notes (Signed)
Subjective: CC: Dm, osteoporosis PCP: [name], DO VEH:[facility] Erica Evans Erica Evans is a 70 y.o. female presenting to clinic today for:  1. Type 2 Diabetes with hypertension, hyperlipidemia:  She reports compliance with her glipizide, Janumet, Norvasc, hydrochlorothiazide and Pravachol.  She denies any chest pain, shortness of breath, visual disturbance or sensory changes.  Her back has been aggravating her more see below.  Last eye exam: needs Last foot exam: needs Last A1c:  Lab Results  Component Value Date   HGBA1C 6.7 02/08/2020   Nephropathy screen indicated?: needs Last flu, zoster and/or pneumovax:  Immunization History  Administered Date(s) Administered  . Fluad Quad(high Dose 65+) 08/15/2019, 08/29/2020  . Influenza Inj Mdck Quad Pf 09/01/2017  . Influenza Split 09/21/2013  . Influenza, High Dose Seasonal PF 08/24/2018  . Influenza,inj,Quad PF,6+ Mos 09/19/2014, 09/03/2015, 08/04/2016  . Influenza-Unspecified 08/30/2017  . Moderna Sars-Covid-2 Vaccination 01/09/2020, 02/06/2020, 10/01/2020  . Pneumococcal Conjugate-13 10/20/2012, 10/17/2018  . Pneumococcal Polysaccharide-23 10/20/2012  . Tdap 07/06/2011    2.  Back pain Patient with chronic back pain.  She was on Celebrex and Flexeril for this but has been off of them for a while.  She would like to go back on them since her back has been aggravating her.  No reports of falls or sensory changes.   ROS: Per HPI  Allergies  Allergen Reactions  . Lipitor [Atorvastatin]   . Morphine And Related    Past Medical History:  Diagnosis Date  . Diabetes mellitus without complication ([place])   . Hyperlipidemia   . Hypertension   . Palpitations   . Vertigo     Current Outpatient Medications:  .  amLODipine (NORVASC) 5 MG tablet, Take 1 tablet (5 mg total) by mouth daily. (NEEDS TO BE SEEN BEFORE NEXT REFILL), Disp: 30 tablet, Rfl: 0 .  blood glucose meter kit and supplies, Check BS once daily, Disp: 1 each,  Rfl: 0 .  busPIRone (BUSPAR) 15 MG tablet, Take 1/2 to 1 tablet every 8 hours for nerves, Disp: 90 tablet, Rfl: 0 .  Cholecalciferol (D-3-5) 125 MCG (5000 UT) capsule, Take 5,000 Units by mouth daily., Disp: , Rfl:  .  cyclobenzaprine (FLEXERIL) 5 MG tablet, TAKE 1 TABLET BY MOUTH 2 TIMES A DAY AS NEEDED FOR MUSCLE SPASMS, Disp: 30 tablet, Rfl: 1 .  glipiZIDE (GLUCOTROL XL) 10 MG 24 hr tablet, TAKE 1 TABLET (10 MG TOTAL) BY MOUTH DAILY WITH BREAKFAST. (NEEDS TO BE SEEN BEFORE NEXT REFILL, Disp: 30 tablet, Rfl: 0 .  hydrochlorothiazide (HYDRODIURIL) 25 MG tablet, Take 1 tablet (25 mg total) by mouth daily. (NEEDS TO BE SEEN BEFORE NEXT REFILL), Disp: 30 tablet, Rfl: 0 .  ONETOUCH VERIO test strip, USE TO TEST BLOOD SUGAR ONCE DAILY DX E11.9, Disp: 100 strip, Rfl: 3 .  pravastatin (PRAVACHOL) 40 MG tablet, TAKE 1 TABLET (40 MG TOTAL) BY MOUTH DAILY. (NEEDS TO BE SEEN BEFORE NEXT REFILL), Disp: 30 tablet, Rfl: 0 .  sitaGLIPtin-metformin (JANUMET) 50-1000 MG tablet, Take 1 tablet by mouth 2 (two) times daily with a meal. (NEEDS TO BE SEEN BEFORE NEXT REFILL), Disp: 60 tablet, Rfl: 0 Social History   Socioeconomic History  . Marital status: Married    Spouse name: [name]  . Number of children: 2  . Years of education: Not on file  . Highest education level: Associate degree: academic program  Occupational History  . Occupation: Control and instrumentation engineer    Comment: retired  Tobacco Use  . Smoking status:  Former Smoker    Quit date: 1990    Years since quitting: 32.3  . Smokeless tobacco: Never Used  Vaping Use  . Vaping Use: Never used  Substance and Sexual Activity  . Alcohol use: No  . Drug use: No  . Sexual activity: Not on file  Other Topics Concern  . 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 She lives at home with her husband [name] and her grown son. She has 3 children. She enjoys bird watching and walking.    Social Determinants of Health   Financial  Resource Strain: Low Risk   . Difficulty of Paying Living Expenses: Not hard at all  Food Insecurity: No Food Insecurity  . Worried About Charity fundraiser in the Last Year: Never true  . Ran Out of Food in the Last Year: Never true  Transportation Needs: No Transportation Needs  . Lack of Transportation (Medical): No  . Lack of Transportation (Non-Medical): No  Physical Activity: Inactive  . Days of Exercise per Week: 0 days  . Minutes of Exercise per Session: 0 min  Stress: Stress Concern Present  . Feeling of Stress : To some extent  Social Connections: Socially Integrated  . Frequency of Communication with Friends and Family: More than three times a week  . Frequency of Social Gatherings with Friends and Family: Once a week  . Attends Religious Services: More than 4 times per year  . Active Member of Clubs or Organizations: Yes  . Attends Archivist Meetings: More than 4 times per year  . Marital Status: Married  Human resources officer Violence: Not At Risk  . Fear of Current or Ex-Partner: No  . Emotionally Abused: No  . Physically Abused: No  . Sexually Abused: No   Family History  Problem Relation Age of Onset  . Coronary artery disease Mother   . Diabetes Mother   . Heart disease Mother   . Coronary artery disease Father   . Heart disease Father   . Diabetes Sister   . Hypertension Sister   . Heart disease Sister   . Diabetes Brother   . Diabetes Sister   . Arthritis Sister   . Hypertension Sister   . Diabetes Sister   . Hypertension Sister   . Hypertension Sister     Objective: Office vital signs reviewed. BP (!) 171/84   Pulse 87   Temp 98.2 F (36.8 Erica)   Ht '5\' 7"'  (1.702 m)   Wt 218 lb 9.6 oz (99.2 kg)   SpO2 99%   BMI 34.24 kg/m   Physical Examination:  General: Awake, alert, well nourished, No acute distress HEENT: Normal; sclera white.  Moist mucous membranes Cardio: regular rate and rhythm, S1S2 heard, no murmurs appreciated Pulm: clear  to auscultation bilaterally, no wheezes, rhonchi or rales; normal work of breathing on room air Extremities: warm, well perfused, No edema, cyanosis or clubbing; +2 pulses bilaterally MSK: normal gait and station Skin: dry; intact; no rashes or lesions Neuro: see dm foot  Diabetic Foot Exam - Simple   Simple Foot Form Diabetic Foot exam was performed with the following findings: Yes 03/11/2021 10:19 AM  Visual Inspection No deformities, no ulcerations, no other skin breakdown bilaterally: Yes Sensation Testing Intact to touch and monofilament testing bilaterally: Yes Pulse Check Posterior Tibialis and Dorsalis pulse intact bilaterally: Yes Comments      Assessment/ Plan: 70 y.o. female   Uncontrolled type 2  diabetes mellitus with hyperglycemia (HCC) - Plan: CMP14+EGFR, Bayer DCA Hb A1c Waived, Microalbumin / creatinine urine ratio, sitaGLIPtin-metformin (JANUMET) 50-1000 MG tablet, glipiZIDE (GLUCOTROL XL) 10 MG 24 hr tablet  Hypertension associated with diabetes ([place]) - Plan: CMP14+EGFR, amLODipine (NORVASC) 10 MG tablet, hydrochlorothiazide (HYDRODIURIL) 25 MG tablet  Hyperlipidemia associated with type 2 diabetes mellitus ([place]) - Plan: CMP14+EGFR, Lipid Panel, TSH, pravastatin (PRAVACHOL) 40 MG tablet  Age-related osteoporosis without current pathological fracture - Plan: CMP14+EGFR, TSH, VITAMIN D 25 Hydroxy (Vit-D Deficiency, Fractures)  Vitamin D deficiency - Plan: VITAMIN D 25 Hydroxy (Vit-D Deficiency, Fractures)  Chronic left-sided low back pain with left-sided sciatica - Plan: cyclobenzaprine (FLEXERIL) 10 MG tablet  GAD (generalized anxiety disorder) - Plan: busPIRone (BUSPAR) 15 MG tablet  A1c is not at goal.  Sugar has risen to 8.3%.  I have added Jardiance since she has had intolerance to Ozempic secondary to nausea.  She will follow-up in 3 weeks with Almyra Free for blood sugar log review and patient assistance with her medications.  4 weeks of Jardiance 10 mg have been  provided to the patient today  Blood pressure not at goal.  Increase Norvasc to 10 mg daily.  Continue hydrochlorothiazide 25 mg daily  Continue statin.  Check lipid panel, CMP  Osteoporosis was not discussed during today's visit but we did check vitamin D.  Unsure as to why she is not on Fosamax  Back pain is exacerbated.  Restart Flexeril, Celebrex as needed.  Buspirone renewed as needed  No orders of the defined types were placed in this encounter.  No orders of the defined types were placed in this encounter.    [name], DO [place] [phone]

## 2021-03-12 ENCOUNTER — Telehealth: Payer: Self-pay

## 2021-03-12 LAB — CMP14+EGFR
ALT: 19 IU/L (ref 0–32)
AST: 20 IU/L (ref 0–40)
Albumin/Globulin Ratio: 1.6 (ref 1.2–2.2)
Albumin: 4.9 g/dL — ABNORMAL HIGH (ref 3.8–4.8)
Alkaline Phosphatase: 74 IU/L (ref 44–121)
BUN/Creatinine Ratio: 11 — ABNORMAL LOW (ref 12–28)
BUN: 11 mg/dL (ref 8–27)
Bilirubin Total: 0.2 mg/dL (ref 0.0–1.2)
CO2: 18 mmol/L — ABNORMAL LOW (ref 20–29)
Calcium: 10.4 mg/dL — ABNORMAL HIGH (ref 8.7–10.3)
Chloride: 92 mmol/L — ABNORMAL LOW (ref 96–106)
Creatinine, Ser: 0.97 mg/dL (ref 0.57–1.00)
Globulin, Total: 3 g/dL (ref 1.5–4.5)
Glucose: 199 mg/dL — ABNORMAL HIGH (ref 65–99)
Potassium: 4.8 mmol/L (ref 3.5–5.2)
Sodium: 136 mmol/L (ref 134–144)
Total Protein: 7.9 g/dL (ref 6.0–8.5)
eGFR: 64 mL/min/{1.73_m2} (ref >59)

## 2021-03-12 LAB — LIPID PANEL
Chol/HDL Ratio: 3.2 ratio (ref 0.0–4.4)
Cholesterol, Total: 178 mg/dL (ref 100–199)
HDL: 56 mg/dL (ref >39)
LDL Chol Calc (NIH): 90 mg/dL (ref 0–99)
Triglycerides: 187 mg/dL — ABNORMAL HIGH (ref 0–149)
VLDL Cholesterol Cal: 32 mg/dL (ref 5–40)

## 2021-03-12 LAB — MICROALBUMIN / CREATININE URINE RATIO
Creatinine, Urine: 99.9 mg/dL
Microalb/Creat Ratio: 22 mg/g creat (ref 0–29)
Microalbumin, Urine: 22 ug/mL

## 2021-03-12 LAB — TSH: TSH: 3.93 u[IU]/mL (ref 0.450–4.500)

## 2021-03-12 LAB — VITAMIN D 25 HYDROXY (VIT D DEFICIENCY, FRACTURES): Vit D, 25-Hydroxy: 60.7 ng/mL (ref 30.0–100.0)

## 2021-03-12 NOTE — Telephone Encounter (Signed)
This approval authorizes your coverage from 11/15/2020 - 06/10/2021, unless we notify you  otherwise, and as long as the following conditions apply: . you remain enrolled in our Medicare Part D prescription drug plan, . your physician or other prescriber continues to prescribe the medication for you, and . the medication continues to be safe for treating your condition.  Patient and pharmacy aware.

## 2021-03-12 NOTE — Telephone Encounter (Signed)
PA in process for cyclobenzaprine (Key: [id number])  Your information has been submitted to [place] Medicare Part D. [employer] Part D will review the request and will issue a decision, typically within 1-3 days from your submission. You can check the updated outcome later by reopening this request.  If [employer] D has not responded in 1-3 days or if you have any questions about your ePA request, please contact [place] Medicare Part D at [phone]. If you think there may be a problem with your PA request, use our live chat feature at the bottom right.

## 2021-03-30 ENCOUNTER — Other Ambulatory Visit: Payer: Self-pay | Admitting: Family Medicine

## 2021-03-30 DIAGNOSIS — I152 Hypertension secondary to endocrine disorders: Secondary | ICD-10-CM

## 2021-03-30 DIAGNOSIS — F411 Generalized anxiety disorder: Secondary | ICD-10-CM

## 2021-04-02 ENCOUNTER — Other Ambulatory Visit: Payer: Self-pay

## 2021-04-02 ENCOUNTER — Ambulatory Visit (INDEPENDENT_AMBULATORY_CARE_PROVIDER_SITE_OTHER): Payer: Medicare HMO | Admitting: Pharmacist

## 2021-04-02 ENCOUNTER — Encounter: Payer: Self-pay | Admitting: Pharmacist

## 2021-04-02 VITALS — BP 135/82

## 2021-04-02 DIAGNOSIS — E119 Type 2 diabetes mellitus without complications: Secondary | ICD-10-CM | POA: Diagnosis not present

## 2021-04-02 DIAGNOSIS — G720 Drug-induced myopathy: Secondary | ICD-10-CM | POA: Diagnosis not present

## 2021-04-02 MED ORDER — DAPAGLIFLOZIN PROPANEDIOL 10 MG PO TABS: 10.0000 mg | ORAL_TABLET | Freq: Every day | ORAL | 0 refills | Status: DC

## 2021-04-02 NOTE — Progress Notes (Signed)
    04/02/2021 Name: Erica Evans MRN: [id number] DOB: 1951-06-15   S:  38 YOf Presents for diabetes evaluation, education, and management Patient was referred and last seen by Primary Care Provider on 03/11/21  Insurance coverage/medication affordability: a[employer]na medicare  Patient reports adherence with medications. . Current diabetes medications include: jardiance, glipizide, janumet . Current hypertension medications include: amlodipine, hctz Goal 130/80 . Current hyperlipidemia medications include: pravastatin STATIN ALLERGY/INTOLERANCE DOCUMENTED IN EMR YES-trouble with lipitor   Patient denies hypoglycemic events.   O:  Lab Results  Component Value Date   HGBA1C 8.3 (H) 03/11/2021    Vitals:   04/02/21 1042  BP: 135/82     Lipid Panel     Component Value Date/Time   CHOL 178 03/11/2021 0955   CHOL 119 04/17/2013 1110   TRIG 187 (H) 03/11/2021 0955   TRIG 178 (H) 09/19/2014 0918   TRIG 148 04/17/2013 1110   HDL 56 03/11/2021 0955   HDL 50 09/19/2014 0918   HDL 49 04/17/2013 1110   CHOLHDL 3.2 03/11/2021 0955   LDLCALC 90 03/11/2021 0955   LDLCALC 99 06/14/2014 0859   LDLCALC 40 04/17/2013 1110     Home fasting blood sugars: 140-  2 hour post-meal/random blood sugars: <200.    Clinical Atherosclerotic Cardiovascular Disease (ASCVD): No   The 10-year ASCVD risk score Mikey Bussing DC Jr., et al., 2013) is: 19.8%   Values used to calculate the score:     Age: 70 years     Sex: Female     Is Non-Hispanic African American: No     Diabetic: Yes     Tobacco smoker: No     Systolic Blood Pressure: 664 mmHg     Is BP treated: Yes     HDL Cholesterol: 56 mg/dL     Total Cholesterol: 178 mg/dL    A/P:  Diabetes T2DM currently uncontrolled. . Patient is adherent with medication. Control is suboptimal due to diet/lifestyle.  She has been unable to tolerate Ozempic due to GI side effects  -Finish jardiance 10mg  daily samples, then transition to [place]  10mg  daily   copay card for free 30 day sample given  Patient to be enrolled in [facility] patient assistance  Application filled out online  Medication will ship to patient's home  RX escribed to [facility] with 3 refills  -START RYBELSUS 3MG   WILL INCREASE TO 7MG  NEXT MONTH AS  TOLERATED  [place] PATIENT ASSISTANCE  SUBMITTED   -Denies personal and family history of  Medullary thyroid cancer (MTC)  -Continue glipizide  -Extensively discussed pathophysiology of diabetes, recommended lifestyle interventions, dietary effects on blood sugar control  -Counseled on s/sx of and management of hypoglycemia  -Next A1C anticipated 7/22   Written patient instructions provided.  Total time in face to face counseling 30 minutes.   Follow up Pharmacist Clinic Visit ON 04/23/21.    Regina Eck, PharmD, BCPS Clinical Pharmacist, [place]  II Phone [phone]

## 2021-04-05 ENCOUNTER — Other Ambulatory Visit: Payer: Self-pay | Admitting: Family Medicine

## 2021-04-05 DIAGNOSIS — F411 Generalized anxiety disorder: Secondary | ICD-10-CM

## 2021-04-09 ENCOUNTER — Telehealth: Payer: Self-pay | Admitting: Family Medicine

## 2021-04-09 NOTE — Telephone Encounter (Signed)
Patient states that she seen Almyra Free and was changed to [place] & rybelsus and now her BS are running over 200. Patient was not home so she could not give me readings.  Aware Almyra Free is out of the office tomorrow and would like to wait until she comes back in for her to get the message.

## 2021-04-10 NOTE — Telephone Encounter (Signed)
BG running in the low 200s Reviewed medications May add back metformin Patient just started [place] so we will give time to work Continue rybelsus-->will increase to 7mg  next month (in 3weeks once patient assistance completed)

## 2021-04-23 ENCOUNTER — Ambulatory Visit (INDEPENDENT_AMBULATORY_CARE_PROVIDER_SITE_OTHER): Payer: Medicare HMO | Admitting: Pharmacist

## 2021-04-23 ENCOUNTER — Other Ambulatory Visit: Payer: Self-pay

## 2021-04-23 DIAGNOSIS — G720 Drug-induced myopathy: Secondary | ICD-10-CM

## 2021-04-23 DIAGNOSIS — E119 Type 2 diabetes mellitus without complications: Secondary | ICD-10-CM

## 2021-04-23 MED ORDER — METFORMIN HCL ER 750 MG PO TB24: 750.0000 mg | ORAL_TABLET | Freq: Every day | ORAL | 3 refills | Status: DC

## 2021-04-23 NOTE — Progress Notes (Signed)
04/23/2021 Name: Sharicka Pogorzelski MRN: [id number] DOB: August 11, 1951     S:  20 YOf Presents for diabetes evaluation, education, and management. Patient was referred and last seen by Primary Care Provider on 03/11/21.  She reports that she is eating less with the start of rybelsus, however she has recently been on vacation and splurged a bit.   Insurance coverage/medication affordability: aetna medicare   Patient reports adherence with medications. Current diabetes medications include: farxiga, rybelsus glipizide Current hypertension medications include: amlodipine, hctz Goal 130/80 Current hyperlipidemia medications include: pravastatin STATIN ALLERGY/INTOLERANCE DOCUMENTED IN EMR YES-trouble with lipitor   Patient denies hypoglycemic events.  O:  Lab Results  Component Value Date   HGBA1C 8.3 (H) 03/11/2021   Lipid Panel     Component Value Date/Time   CHOL 178 03/11/2021 0955   CHOL 119 04/17/2013 1110   TRIG 187 (H) 03/11/2021 0955   TRIG 178 (H) 09/19/2014 0918   TRIG 148 04/17/2013 1110   HDL 56 03/11/2021 0955   HDL 50 09/19/2014 0918   HDL 49 04/17/2013 1110   CHOLHDL 3.2 03/11/2021 0955   [place] 90 03/11/2021 0955   LDLCALC 99 06/14/2014 0859   LDLCALC 40 04/17/2013 1110    Home fasting blood sugars: 177-210 Post prandials up to 250-260   Clinical Atherosclerotic Cardiovascular Disease (ASCVD): No   The 10-year ASCVD risk score Mikey Bussing DC Jr., et al., 2013) is: 19.8%   Values used to calculate the score:     Age: 70 years     Sex: Female     Is Non-Hispanic African American: No     Diabetic: Yes     Tobacco smoker: No     Systolic Blood Pressure: 638 mmHg     Is BP treated: Yes     HDL Cholesterol: 56 mg/dL     Total Cholesterol: 178 mg/dL    A/P:  Diabetes T2DM currently uncontrolled. . Patient is adherent with medication. Control is suboptimal due to diet/lifestyle.  She has been unable to tolerate Ozempic due to GI side effects    -Continue Farxiga 10mg  daily-GFR 64             copay card for free 30 day sample given             Patient to be enrolled in [employer] patient assistance             Application filled out online             Medication will ship to patient's home             RX escribed to [facility] with 3 refills   -Continue RYBELSUS 3MG              WILL INCREASE TO 7MG  NEXT week AS          TOLERATED             [facility] [place] PATIENT ASSISTANCE SUBMITTED (patient approved--will ship to PCP office next week)             -Denies personal and family history of Medullary thyroid cancer (MTC)   -Continue glipizide  -Add back PM metformin for evening post prandials  -Extensively discussed pathophysiology of diabetes, recommended lifestyle interventions, dietary effects on blood sugar control  -Counseled on s/sx of and management of hypoglycemia  -Next A1C anticipated 06/11/21.  Written patient instructions provided.  Total time in face to face counseling 22 minutes.  Regina Eck, PharmD, BCPS Clinical Pharmacist, [place]  II Phone [phone]

## 2021-04-29 ENCOUNTER — Encounter: Payer: Self-pay | Admitting: Family Medicine

## 2021-04-29 ENCOUNTER — Telehealth: Payer: Self-pay | Admitting: Pharmacist

## 2021-04-29 ENCOUNTER — Telehealth: Payer: Self-pay | Admitting: Family Medicine

## 2021-04-29 NOTE — Telephone Encounter (Signed)
Please let patient know that she has been approved for free Rybelsus It should arrive to our office in 1-2 weeks.  We will call her when it comes in.  She can call [phone] to check on status at any time.  Patience is key! These medications are free for the rest of the year, but take time to process and get here  Thank you! Almyra Free

## 2021-04-29 NOTE — Telephone Encounter (Signed)
Pt called stating that she only has 3 tablets left of her Rybelsus sample. Needs more until her order comes in the mail.  Please advise and call patient.

## 2021-04-30 ENCOUNTER — Ambulatory Visit (INDEPENDENT_AMBULATORY_CARE_PROVIDER_SITE_OTHER): Payer: Medicare HMO | Admitting: Family Medicine

## 2021-04-30 ENCOUNTER — Encounter: Payer: Self-pay | Admitting: Family Medicine

## 2021-04-30 DIAGNOSIS — U071 COVID-19: Secondary | ICD-10-CM

## 2021-04-30 MED ORDER — GUAIFENESIN ER 600 MG PO TB12: 600.0000 mg | ORAL_TABLET | Freq: Two times a day (BID) | ORAL | 0 refills | Status: DC | PRN

## 2021-04-30 MED ORDER — MOLNUPIRAVIR EUA 200MG CAPSULE: 4.0000 | ORAL_CAPSULE | Freq: Two times a day (BID) | ORAL | 0 refills | Status: AC

## 2021-04-30 NOTE — Progress Notes (Signed)
   Virtual Visit  Note Due to COVID-19 pandemic this visit was conducted virtually. This visit type was conducted due to national recommendations for restrictions regarding the COVID-19 Pandemic (e.g. social distancing, sheltering in place) in an effort to limit this patient's exposure and mitigate transmission in our community. All issues noted in this document were discussed and addressed.  A physical exam was not performed with this format.  I connected with Erica Evans on 04/30/21 at 1304 by telephone and verified that I am speaking with the correct person using two identifiers. Erica Evans is currently located at home and her husband is currently with her during the visit. The provider, Gwenlyn Perking, FNP is located in their office at time of visit.  I discussed the limitations, risks, security and privacy concerns of performing an evaluation and management service by telephone and the availability of in person appointments. I also discussed with the patient that there may be a patient responsible charge related to this service. The patient expressed understanding and agreed to proceed.  CC: Covid  History and Present Illness:  HPI Erica Evans reports a positive home Covid test this morning. Her husband tested positive a few days ago and she has been caring for him. She reports congestion, scratchy throat, cough, chills, and change in taste that started yesterday. She denies fever, temperature has been 99.8. Denies chest pain, shortness of breath, nausea, vomiting, or diarrhea. She has been taking allegra, tylenol, and cough drops. She has a history of DM and HTN. She has been vaccinated and boosted.     ROS As per HPI.   Observations/Objective: Alert and oriented x 3. Able to speak in full sentences without difficulty.    Assessment and Plan: [name] was seen today for covid positive.  Diagnoses and all orders for this visit:  COVID Positive test today, symptoms  started yesterday. Molnupiravir as below, patient is postmenopausal. Mucinex as needed for cough, congestion. Continue tylenol, allegra, and cough drops. Stay well hydrated. To ER for shortness of breath, chest pain. Discussed quarantine measures.  -     molnupiravir EUA 200 mg CAPS; Take 4 capsules (800 mg total) by mouth 2 (two) times daily for 5 days. -     guaiFENesin (MUCINEX) 600 MG 12 hr tablet; Take 1-2 tablets (600-1,200 mg total) by mouth 2 (two) times daily as needed for cough or to loosen phlegm.    Follow Up Instructions: Return to office for new or worsening symptoms, or if symptoms persist.     I discussed the assessment and treatment plan with the patient. The patient was provided an opportunity to ask questions and all were answered. The patient agreed with the plan and demonstrated an understanding of the instructions.   The patient was advised to call back or seek an in-person evaluation if the symptoms worsen or if the condition fails to improve as anticipated.  The above assessment and management plan was discussed with the patient. The patient verbalized understanding of and has agreed to the management plan. Patient is aware to call the clinic if symptoms persist or worsen. Patient is aware when to return to the clinic for a follow-up visit. Patient educated on when it is appropriate to go to the emergency department.   Time call ended:  1316  I provided 12 minutes of  non face-to-face time during this encounter.    Gwenlyn Perking, FNP

## 2021-05-01 ENCOUNTER — Encounter: Payer: Self-pay | Admitting: Family Medicine

## 2021-05-04 ENCOUNTER — Other Ambulatory Visit: Payer: Self-pay | Admitting: Family Medicine

## 2021-05-04 DIAGNOSIS — E1159 Type 2 diabetes mellitus with other circulatory complications: Secondary | ICD-10-CM

## 2021-05-04 DIAGNOSIS — G8929 Other chronic pain: Secondary | ICD-10-CM

## 2021-05-04 DIAGNOSIS — I152 Hypertension secondary to endocrine disorders: Secondary | ICD-10-CM

## 2021-05-04 DIAGNOSIS — M5442 Lumbago with sciatica, left side: Secondary | ICD-10-CM

## 2021-05-07 ENCOUNTER — Telehealth: Payer: Self-pay | Admitting: *Deleted

## 2021-05-07 NOTE — Telephone Encounter (Signed)
Patient aware that we have received patient assistance medication for Rybelsus 4 boxes of 30 tablets.

## 2021-05-29 ENCOUNTER — Other Ambulatory Visit: Payer: Self-pay | Admitting: Family Medicine

## 2021-05-29 ENCOUNTER — Encounter: Payer: Self-pay | Admitting: Family Medicine

## 2021-05-29 ENCOUNTER — Telehealth: Payer: Self-pay | Admitting: Family Medicine

## 2021-05-29 DIAGNOSIS — E1165 Type 2 diabetes mellitus with hyperglycemia: Secondary | ICD-10-CM

## 2021-05-29 MED ORDER — EMPAGLIFLOZIN 10 MG PO TABS: 10.0000 mg | ORAL_TABLET | Freq: Every day | ORAL | 0 refills | Status: DC

## 2021-05-29 NOTE — Telephone Encounter (Signed)
Pt cannot take farxiga because it is causing private area irritation yeast infections and constipation. She want to back on jardiance. She did not have side effect on jardiance. Please call back  Pt aware that Almyra Free is out.

## 2021-05-29 NOTE — Telephone Encounter (Signed)
Done. Start back on 10mg . Anticipate need for 25mg .  To see Almyra Free in 2 weeks.  Please make sure she is scheduled.

## 2021-05-31 ENCOUNTER — Other Ambulatory Visit: Payer: Self-pay | Admitting: Family Medicine

## 2021-05-31 DIAGNOSIS — G8929 Other chronic pain: Secondary | ICD-10-CM

## 2021-05-31 DIAGNOSIS — I152 Hypertension secondary to endocrine disorders: Secondary | ICD-10-CM

## 2021-05-31 DIAGNOSIS — E1159 Type 2 diabetes mellitus with other circulatory complications: Secondary | ICD-10-CM

## 2021-06-12 ENCOUNTER — Encounter: Payer: Self-pay | Admitting: Family Medicine

## 2021-06-12 ENCOUNTER — Other Ambulatory Visit: Payer: Self-pay

## 2021-06-12 ENCOUNTER — Ambulatory Visit (INDEPENDENT_AMBULATORY_CARE_PROVIDER_SITE_OTHER): Payer: Medicare HMO | Admitting: Family Medicine

## 2021-06-12 VITALS — BP 126/81 | HR 104 | Temp 97.2°F | Ht 67.0 in | Wt 205.8 lb

## 2021-06-12 DIAGNOSIS — I152 Hypertension secondary to endocrine disorders: Secondary | ICD-10-CM

## 2021-06-12 DIAGNOSIS — E1169 Type 2 diabetes mellitus with other specified complication: Secondary | ICD-10-CM | POA: Diagnosis not present

## 2021-06-12 DIAGNOSIS — E1159 Type 2 diabetes mellitus with other circulatory complications: Secondary | ICD-10-CM | POA: Diagnosis not present

## 2021-06-12 DIAGNOSIS — E1165 Type 2 diabetes mellitus with hyperglycemia: Secondary | ICD-10-CM

## 2021-06-12 DIAGNOSIS — E785 Hyperlipidemia, unspecified: Secondary | ICD-10-CM

## 2021-06-12 LAB — BAYER DCA HB A1C WAIVED: HB A1C (BAYER DCA - WAIVED): 8 % — ABNORMAL HIGH (ref <7.0)

## 2021-06-12 NOTE — Progress Notes (Signed)
Subjective: CC: Type 2 diabetes PCP: Erica Norlander, DO LKT:GYBWL [name] is a 70 y.o. female presenting to clinic today for:  1. Type 2 Diabetes with hypertension, hyperlipidemia:  Doing better on the Jardiance 10 mg and she was on the [place].  Unfortunately, the Wilder Glade was causing vaginitis and that is since resolved since going on Jardiance 10 mg.  She is compliant with glipizide XL 10 mg, Rybelsus 7 mg and metformin extended release 750 mg.  She has felt a positive impact with the metformin.  Blood sugars have been running anywhere between 130s and 160s.  No reports of polydipsia, polyuria, visual disturbance, chest pain or shortness of breath.  Last eye exam: UTD Last foot exam: UTD Last A1c:  Lab Results  Component Value Date   HGBA1C 8.0 (H) 06/12/2021   Nephropathy screen indicated?: UTD Last flu, zoster and/or pneumovax:  Immunization History  Administered Date(s) Administered   Fluad Quad(high Dose 65+) 08/15/2019, 08/29/2020   Influenza Inj Mdck Quad Pf 09/01/2017   Influenza Split 09/21/2013   Influenza, High Dose Seasonal PF 08/24/2018   Influenza,inj,Quad PF,6+ Mos 09/19/2014, 09/03/2015, 08/04/2016   Influenza-Unspecified 08/30/2017   Moderna Sars-Covid-2 Vaccination 01/09/2020, 02/06/2020, 10/01/2020   Pneumococcal Conjugate-13 10/20/2012, 10/17/2018   Pneumococcal Polysaccharide-23 10/20/2012   Tdap 07/06/2011    ROS: Per HPI  Allergies  Allergen Reactions   Lipitor [Atorvastatin]    Morphine And Related    Past Medical History:  Diagnosis Date   Diabetes mellitus without complication ([place])    Hyperlipidemia    Hypertension    Palpitations    Vertigo     Current Outpatient Medications:    amLODipine (NORVASC) 10 MG tablet, Take 1 tablet (10 mg total) by mouth daily., Disp: 90 tablet, Rfl: 3   blood glucose meter kit and supplies, Check BS once daily, Disp: 1 each, Rfl: 0   busPIRone (BUSPAR) 15 MG tablet, TAKE 1/2 TO 1 TABLET EVERY  8 HOURS FOR NERVES, Disp: 270 tablet, Rfl: 0   celecoxib (CELEBREX) 200 MG capsule, TAKE 1 CAPSULE (200 MG TOTAL) BY MOUTH DAILY AS NEEDED FOR MODERATE PAIN., Disp: 30 capsule, Rfl: 1   Cholecalciferol (D-3-5) 125 MCG (5000 UT) capsule, Take 5,000 Units by mouth daily., Disp: , Rfl:    cyclobenzaprine (FLEXERIL) 5 MG tablet, TAKE 1 TABLET BY MOUTH 2 TIMES A DAY AS NEEDED FOR MUSCLE SPASMS., Disp: 30 tablet, Rfl: 1   empagliflozin (JARDIANCE) 10 MG TABS tablet, Take 1 tablet (10 mg total) by mouth daily before breakfast. STOP Wilder Glade. See julie in 2 weeks for increase in dose., Disp: 30 tablet, Rfl: 0   glipiZIDE (GLUCOTROL XL) 10 MG 24 hr tablet, Take 1 tablet (10 mg total) by mouth daily with breakfast., Disp: 90 tablet, Rfl: 3   guaiFENesin (MUCINEX) 600 MG 12 hr tablet, Take 1-2 tablets (600-1,200 mg total) by mouth 2 (two) times daily as needed for cough or to loosen phlegm., Disp: 30 tablet, Rfl: 0   hydrochlorothiazide (HYDRODIURIL) 25 MG tablet, Take 1 tablet (25 mg total) by mouth daily., Disp: 90 tablet, Rfl: 3   metFORMIN (GLUCOPHAGE-XR) 750 MG 24 hr tablet, Take 1 tablet (750 mg total) by mouth daily with supper., Disp: 90 tablet, Rfl: 3   ONETOUCH VERIO test strip, USE TO TEST BLOOD SUGAR ONCE DAILY DX E11.9, Disp: 100 strip, Rfl: 3   pravastatin (PRAVACHOL) 40 MG tablet, Take 1 tablet (40 mg total) by mouth daily., Disp: 90 tablet, Rfl: 3  Semaglutide (RYBELSUS) 3 MG TABS, Take by mouth., Disp: , Rfl:  Social History   Socioeconomic History   Marital status: Married    Spouse name: Erica Evans   Number of children: 2   Years of education: Not on file   Highest education level: Associate degree: academic program  Occupational History   Occupation: Control and instrumentation engineer    Comment: retired  Tobacco Use   Smoking status: Former    Types: Cigarettes    Quit date: [place] since quitting: 32.5   Smokeless tobacco: Never  Vaping Use   Vaping Use: Never used  Substance and Sexual  Activity   Alcohol use: No   Drug use: No   Sexual activity: Not on file  Other Topics Concern   Not on file  Social History Narrative   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 She lives at home with her husband Erica Evans and her grown son. She has 3 children. She enjoys bird watching and walking.    Social Determinants of Health   Financial Resource Strain: Low Risk    Difficulty of Paying Living Expenses: Not hard at all  Food Insecurity: No Food Insecurity   Worried About Charity fundraiser in the Last Year: Never true   [place] in the Last Year: Never true  Transportation Needs: No Transportation Needs   Lack of Transportation (Medical): No   Lack of Transportation (Non-Medical): No  Physical Activity: Inactive   Days of Exercise per Week: 0 days   Minutes of Exercise per Session: 0 min  Stress: Stress Concern Present   Feeling of Stress : To some extent  Social Connections: Engineer, building services of Communication with Friends and Family: More than three times a week   Frequency of Social Gatherings with Friends and Family: Once a week   Attends Religious Services: More than 4 times per year   Active Member of [employer] or Organizations: Yes   Attends Music therapist: More than 4 times per year   Marital Status: Married  Human resources officer Violence: Not At Risk   Fear of Current or Ex-Partner: No   Emotionally Abused: No   Physically Abused: No   Sexually Abused: No   Family History  Problem Relation Age of Onset   Coronary artery disease Mother    Diabetes Mother    Heart disease Mother    Coronary artery disease Father    Heart disease Father    Diabetes Sister    Hypertension Sister    Heart disease Sister    Diabetes Brother    Diabetes Sister    Arthritis Sister    Hypertension Sister    Diabetes Sister    Hypertension Sister    Hypertension Sister     Objective: Office vital signs reviewed. BP 126/81   Pulse (!) 104   Temp (!)  97.2 F (36.2 C)   Ht '5\' 7"'  (1.702 m)   Wt 205 lb 12.8 oz (93.4 kg)   SpO2 96%   BMI 32.23 kg/m   Physical Examination:  General: Awake, alert, obese, No acute distress HEENT: Normal; sclera white Cardio: regular rate and rhythm, S1S2 heard, no murmurs appreciated Pulm: clear to auscultation bilaterally, no wheezes, rhonchi or rales; normal work of breathing on room air Extremities: warm, well perfused, No edema, cyanosis or clubbing; +2 pulses bilaterally   Assessment/ Plan: 70 y.o. female   Uncontrolled type 2 diabetes mellitus with hyperglycemia ([place]) -  Plan: Bayer DCA Hb A1c Waived  Hypertension associated with diabetes ([place])  Hyperlipidemia associated with type 2 diabetes mellitus ([place])  Sugar remains uncontrolled but is getting better with A1c down to 8.0.  We will consider advancement of Rybelsus to 14 mg but she is only been on the Jardiance for couple of weeks.  Would like to see how she will do with this dose.  I hesitate to advance her to 25 mg given vaginitis with Wilder Glade but we could consider this as well.  Blood pressure is at goal.  No changes needed  Continue statin  Orders Placed This Encounter  Procedures   Bayer DCA Hb A1c Waived   No orders of the defined types were placed in this encounter.    Erica Norlander, DO [place] [phone]

## 2021-06-23 ENCOUNTER — Ambulatory Visit: Payer: Self-pay

## 2021-06-25 ENCOUNTER — Other Ambulatory Visit: Payer: Self-pay | Admitting: Family Medicine

## 2021-06-25 DIAGNOSIS — E1165 Type 2 diabetes mellitus with hyperglycemia: Secondary | ICD-10-CM

## 2021-06-26 ENCOUNTER — Other Ambulatory Visit: Payer: Self-pay

## 2021-06-26 ENCOUNTER — Ambulatory Visit: Payer: Medicare HMO | Admitting: Pharmacist

## 2021-06-26 DIAGNOSIS — E1165 Type 2 diabetes mellitus with hyperglycemia: Secondary | ICD-10-CM | POA: Diagnosis not present

## 2021-06-26 MED ORDER — METFORMIN HCL ER 500 MG PO TB24: 1000.0000 mg | ORAL_TABLET | Freq: Every day | ORAL | 2 refills | Status: DC

## 2021-06-26 MED ORDER — METFORMIN HCL ER 500 MG PO TB24
1000.0000 mg | ORAL_TABLET | Freq: Every day | ORAL | 2 refills | Status: DC
Start: 2021-06-26 — End: 2022-02-18

## 2021-06-26 NOTE — Progress Notes (Signed)
Chronic Care Management Pharmacy Note  06/26/2021 Name:  Erica Evans MRN:  [id number] DOB:  1951-02-13  Summary: T2DM  Recommendations/Changes made from today's visit: Current Barriers:  Unable to independently afford treatment regimen Unable to maintain control of T2DM  Pharmacist Clinical Goal(s):  Over the next 90 days, patient will verbalize ability to afford treatment regimen maintain control of T2DM as evidenced by IMPROVED GLYCEMIC CONTROL  through collaboration with PharmD and provider.    Interventions: 1:1 collaboration with [name], DO regarding development and update of comprehensive plan of care as evidenced by provider attestation and co-signature Inter-disciplinary care team collaboration (see longitudinal plan of care) Comprehensive medication review performed; medication list updated in electronic medical record  Diabetes: Uncontrolled-A1C 8.4% current treatment:RYBELSUS, JARDIANCE 10MG, METFORMIN, GLIPIZIDE; GFR 64 INTOLERANCE REPORTED TO FARXIGA, BUT TOLERATING JARDIANCE WELL SLOWLY TITRATING RYBELSUS DUE TO GI SIDE EFFECTS WILL APPLY FOR JARDIANCE ONCE PATIENT INCREASED TO 25MG DAILY (VIA BI CARES PATIENT ASSISTANCE) PLAN TO D/C GLIPIZIDE  INCREASED METFORMIN XR TO 1G Current glucose readings: fasting glucose: <160, post prandial glucose: <200 Denies hypoglycemic/hyperglycemic symptoms Discussed meal planning options and Plate method for healthy eating Avoid sugary drinks and desserts Incorporate balanced protein, non starchy veggies, 1 serving of carbohydrate with each meal Increase water intake Increase physical activity as able  Current exercise: N/A Educated on DIABETES, MEDICATIONS Recommended INCREASE RYBELSUS TO 14MG DAILY, CONTINUE ALL OTHER MEDS, F/U IN 1 MONTH  Follow Up Plan: Telephone follow up appointment with care management team member scheduled for: 1 MONTH    Subjective: Erica Evans is an 70 y.o.  year old female who is a primary patient of [name], DO.  The CCM team was consulted for assistance with disease management and care coordination needs.    Engaged with patient face to face for follow up visit in response to provider referral for pharmacy case management and/or care coordination services.   Consent to Services:  The patient was given information about Chronic Care Management services, agreed to services, and gave verbal consent prior to initiation of services.  Please see initial visit note for detailed documentation.   Patient Care Team: [name], DO as PCP - General (Family Medicine) Sandford Craze, MD as Referring Physician (Dermatology) Leticia Clas, OD (Optometry) Lavera Guise, [facility] as Pharmacist (Family Medicine)   Objective:  Lab Results  Component Value Date   CREATININE 0.97 03/11/2021   CREATININE 0.93 02/08/2020   CREATININE 0.99 07/20/2019    Lab Results  Component Value Date   HGBA1C 8.0 (H) 06/12/2021   Last diabetic Eye exam:  Lab Results  Component Value Date/Time   HMDIABEYEEXA No Retinopathy 08/02/2019 12:00 AM    Last diabetic Foot exam: No results found for: HMDIABFOOTEX      Component Value Date/Time   CHOL 178 03/11/2021 0955   CHOL 119 04/17/2013 1110   TRIG 187 (H) 03/11/2021 0955   TRIG 178 (H) 09/19/2014 0918   TRIG 148 04/17/2013 1110   HDL 56 03/11/2021 0955   HDL 50 09/19/2014 0918   HDL 49 04/17/2013 1110   CHOLHDL 3.2 03/11/2021 0955   LDLCALC 90 03/11/2021 0955   LDLCALC 99 06/14/2014 0859   LDLCALC 40 04/17/2013 1110    Hepatic Function Latest Ref Rng & Units 03/11/2021 02/08/2020 10/17/2018  Total Protein 6.0 - 8.5 g/dL 7.9 6.8 7.0  Albumin 3.8 - 4.8 g/dL 4.9(H) 4.2 4.5  AST 0 - 40 IU/L 20  18 25  ALT 0 - 32 IU/L '19 11 27  ' Alk Phosphatase 44 - 121 IU/L 74 64 53  Total Bilirubin 0.0 - 1.2 mg/dL 0.2 <0.2 0.3    Lab Results  Component Value Date/Time   TSH 3.930 03/11/2021 09:55 AM    TSH 3.620 02/08/2020 08:35 AM    CBC Latest Ref Rng & Units 12/27/2017 06/26/2015 04/18/2008  WBC 3.4 - 10.8 x10E3/uL 8.2 7.8 4.9  Hemoglobin 11.1 - 15.9 g/dL 12.9 13.7 12.7  Hematocrit 34.0 - 46.6 % 40.1 40.8 37.1  Platelets 150 - 379 x10E3/uL 441(H) 437(H) 359    Lab Results  Component Value Date/Time   VD25OH 60.7 03/11/2021 09:55 AM   VD25OH 76.8 02/08/2020 08:35 AM    Clinical ASCVD: No  The 10-year ASCVD risk score Mikey Bussing DC Jr., et al., 2013) is: 17.5%   Values used to calculate the score:     Age: 70 years     Sex: Female     Is Non-Hispanic African American: No     Diabetic: Yes     Tobacco smoker: No     Systolic Blood Pressure: 974 mmHg     Is BP treated: Yes     HDL Cholesterol: 56 mg/dL     Total Cholesterol: 178 mg/dL    Other: (CHADS2VASc if Afib, PHQ9 if depression, MMRC or CAT for COPD, ACT, DEXA)  Social History   Tobacco Use  Smoking Status Former   Types: Cigarettes   Quit date: 1990   Years since quitting: 32.6  Smokeless Tobacco Never   BP Readings from Last 3 Encounters:  06/12/21 126/81  04/02/21 135/82  03/11/21 (!) 171/84   Pulse Readings from Last 3 Encounters:  06/12/21 (!) 104  03/11/21 87  07/20/19 94   Wt Readings from Last 3 Encounters:  06/12/21 205 lb 12.8 oz (93.4 kg)  03/11/21 218 lb 9.6 oz (99.2 kg)  02/16/21 216 lb (98 kg)    Assessment: Review of patient past medical history, allergies, medications, health status, including review of consultants reports, laboratory and other test data, was performed as part of comprehensive evaluation and provision of chronic care management services.   SDOH:  (Social Determinants of Health) assessments and interventions performed:    CCM Care Plan  Allergies  Allergen Reactions   Lipitor [Atorvastatin]    Morphine And Related    Farxiga [Dapagliflozin]     UTI/CONSTIPATION TOLERATES JARDIANCE    Medications Reviewed Today     Reviewed by [name], DO (Physician)  on 06/12/21 at [phone]  Med List Status: <None>   Medication Order Taking? Sig Documenting Provider Last Dose Status Informant  amLODipine (NORVASC) 10 MG tablet [id number] Yes Take 1 tablet (10 mg total) by mouth daily. Ronnie Doss M, DO Taking Active   blood glucose meter kit and supplies [id number] Yes Check BS once daily Timmothy Euler, MD Taking Active   busPIRone (BUSPAR) 15 MG tablet [id number] Yes TAKE 1/2 TO 1 TABLET EVERY 8 HOURS FOR NERVES Ronnie Doss M, DO Taking Active   celecoxib (CELEBREX) 200 MG capsule [id number] Yes TAKE 1 CAPSULE (200 MG TOTAL) BY MOUTH DAILY AS NEEDED FOR MODERATE PAIN. Ronnie Doss M, DO Taking Active   Cholecalciferol (D-3-5) 125 MCG (5000 UT) capsule [id number] Yes Take 5,000 Units by mouth daily. [provider] Taking Active   cyclobenzaprine (FLEXERIL) 5 MG tablet [id number] Yes TAKE 1 TABLET BY MOUTH 2 TIMES A DAY AS NEEDED FOR MUSCLE SPASMS. Lajuana Ripple,  Koleen Distance, DO Taking Active   empagliflozin (JARDIANCE) 10 MG TABS tablet [id number] Yes Take 1 tablet (10 mg total) by mouth daily before breakfast. STOP Wilder Glade. See Aubreana Cornacchia in 2 weeks for increase in dose. Ronnie Doss M, DO Taking Active   glipiZIDE (GLUCOTROL XL) 10 MG 24 hr tablet [id number] Yes Take 1 tablet (10 mg total) by mouth daily with breakfast. Ronnie Doss M, DO Taking Active   guaiFENesin (MUCINEX) 600 MG 12 hr tablet [id number] Yes Take 1-2 tablets (600-1,200 mg total) by mouth 2 (two) times daily as needed for cough or to loosen phlegm. Gwenlyn Perking, FNP Taking Active   hydrochlorothiazide (HYDRODIURIL) 25 MG tablet [id number] Yes Take 1 tablet (25 mg total) by mouth daily. Ronnie Doss M, DO Taking Active   metFORMIN (GLUCOPHAGE-XR) 750 MG 24 hr tablet [id number] Yes Take 1 tablet (750 mg total) by mouth daily with supper. Ronnie Doss [place], DO Taking Active   [facility] VERIO test strip [id number] Yes USE TO TEST BLOOD SUGAR ONCE DAILY DX E11.9 Ronnie Doss M, DO Taking Active   pravastatin (PRAVACHOL) 40 MG tablet [id number] Yes Take 1 tablet (40 mg total) by mouth daily. Ronnie Doss M, DO Taking Active   Semaglutide (RYBELSUS) 3 MG TABS [id number] Yes Take by mouth. [provider] Taking Active             Patient Active Problem List   Diagnosis Date Noted   Hyperlipidemia associated with type 2 diabetes mellitus ([place]) 10/17/2018   Osteoporosis 12/27/2017   Vitamin D deficiency 06/27/2015   BMI 34.0-34.9,adult 06/14/2014   GAD (generalized anxiety disorder) 06/14/2014   Hypertension associated with diabetes ([place]) 04/17/2013   Diabetes ([place]) 04/17/2013    Immunization History  Administered Date(s) Administered   Fluad Quad(high Dose 65+) 08/15/2019, 08/29/2020   Influenza Inj Mdck Quad Pf 09/01/2017   Influenza Split 09/21/2013   Influenza, High Dose Seasonal PF 08/24/2018   Influenza,inj,Quad PF,6+ Mos 09/19/2014, 09/03/2015, 08/04/2016   Influenza-Unspecified 08/30/2017   Moderna Sars-Covid-2 Vaccination 01/09/2020, 02/06/2020, 10/01/2020   Pneumococcal Conjugate-13 10/20/2012, 10/17/2018   Pneumococcal Polysaccharide-23 10/20/2012   Tdap 07/06/2011    Conditions to be addressed/monitored: DMII  Care Plan : PHARMD MEDICATION MANAGEMENT  Updates made by Lavera Guise, [place] since 07/09/2021 12:00 AM     Problem: DISEASE PROGRESSION PREVENTION      Long-Range Goal: T2DM   This Visit's Progress: Not on track  Priority: High  Note:   Current Barriers:  Unable to independently afford treatment regimen Unable to maintain control of T2DM  Pharmacist Clinical Goal(s):  Over the next 90 days, patient will verbalize ability to afford treatment regimen maintain control of T2DM as evidenced by IMPROVED GLYCEMIC CONTROL  through collaboration with PharmD and provider.    Interventions: 1:1 collaboration with [name], DO regarding development and update of comprehensive plan of care as  evidenced by provider attestation and co-signature Inter-disciplinary care team collaboration (see longitudinal plan of care) Comprehensive medication review performed; medication list updated in electronic medical record  Diabetes: Uncontrolled-A1C 8.4% current treatment:RYBELSUS, JARDIANCE 10MG, METFORMIN, GLIPIZIDE; GFR 64 INTOLERANCE REPORTED TO FARXIGA, BUT TOLERATING JARDIANCE WELL SLOWLY TITRATING RYBELSUS DUE TO GI SIDE EFFECTS WILL APPLY FOR JARDIANCE ONCE PATIENT INCREASED TO 25MG DAILY (VIA BI CARES PATIENT ASSISTANCE) PLAN TO D/C GLIPIZIDE  INCREASED METFORMIN XR TO 1G Current glucose readings: fasting glucose: <160, post prandial glucose: <200 Denies hypoglycemic/hyperglycemic symptoms Discussed meal planning options and Plate method for healthy eating Avoid sugary  drinks and desserts Incorporate balanced protein, non starchy veggies, 1 serving of carbohydrate with each meal Increase water intake Increase physical activity as able  Current exercise: N/A Educated on DIABETES, MEDICATIONS Recommended INCREASE RYBELSUS TO 14MG DAILY, CONTINUE ALL OTHER MEDS, F/U IN 1 MONTH   Patient Goals/Self-Care Activities Over the next 90 days, patient will:  - take medications as prescribed collaborate with provider on medication access solutions  Follow Up Plan: Telephone follow up appointment with care management team member scheduled for: 1 MONTH      Medication Assistance:  WILL ENROLL PATIENT IN [place] IS TOLERATING MEDICATION--SAMPLES GIVEN  Patient's preferred pharmacy is:  [id number] [place] [place]Phone: [phone]: [phone] [id number] [place]: [phone]: [phone] Up:  Patient agrees to Care Plan and Follow-up.  Plan:  Telephone follow up appointment with care management team member scheduled for:  08/10/21  JuRegina EckPharmD, BCPS Clinical Pharmacist, [place] Phone [phone]

## 2021-07-05 ENCOUNTER — Other Ambulatory Visit: Payer: Self-pay | Admitting: Family Medicine

## 2021-07-05 DIAGNOSIS — I152 Hypertension secondary to endocrine disorders: Secondary | ICD-10-CM

## 2021-07-05 DIAGNOSIS — E1159 Type 2 diabetes mellitus with other circulatory complications: Secondary | ICD-10-CM

## 2021-07-07 ENCOUNTER — Other Ambulatory Visit: Payer: Self-pay | Admitting: *Deleted

## 2021-07-07 DIAGNOSIS — E1165 Type 2 diabetes mellitus with hyperglycemia: Secondary | ICD-10-CM

## 2021-07-07 MED ORDER — EMPAGLIFLOZIN 10 MG PO TABS: 10.0000 mg | ORAL_TABLET | Freq: Every day | ORAL | 0 refills | Status: DC

## 2021-07-09 NOTE — Patient Instructions (Signed)
Visit Information  PATIENT GOALS:  Goals Addressed               This Visit's Progress     Patient Stated     T2DM-PHARMD GOAL (pt-stated)        Current Barriers:  Unable to independently afford treatment regimen Unable to maintain control of T2DM  Pharmacist Clinical Goal(s):  Over the next 90 days, patient will verbalize ability to afford treatment regimen maintain control of T2DM as evidenced by IMPROVED GLYCEMIC CONTROL  through collaboration with PharmD and provider.    Interventions: 1:1 collaboration with [name], DO regarding development and update of comprehensive plan of care as evidenced by provider attestation and co-signature Inter-disciplinary care team collaboration (see longitudinal plan of care) Comprehensive medication review performed; medication list updated in electronic medical record  Diabetes: Uncontrolled-A1C 8.4% current treatment:RYBELSUS, JARDIANCE '10MG'$ , METFORMIN, GLIPIZIDE; GFR 64 INTOLERANCE REPORTED TO FARXIGA, BUT TOLERATING JARDIANCE WELL SLOWLY TITRATING RYBELSUS DUE TO GI SIDE EFFECTS WILL APPLY FOR JARDIANCE ONCE PATIENT INCREASED TO '25MG'$  DAILY (VIA BI CARES PATIENT ASSISTANCE) PLAN TO D/C GLIPIZIDE  INCREASED METFORMIN XR TO 1G Current glucose readings: fasting glucose: <160, post prandial glucose: <200 Denies hypoglycemic/hyperglycemic symptoms Discussed meal planning options and Plate method for healthy eating Avoid sugary drinks and desserts Incorporate balanced protein, non starchy veggies, 1 serving of carbohydrate with each meal Increase water intake Increase physical activity as able  Current exercise: N/A Educated on DIABETES, MEDICATIONS Recommended INCREASE RYBELSUS TO '14MG'$  DAILY, CONTINUE ALL OTHER MEDS, F/U IN 1 MONTH   Patient Goals/Self-Care Activities Over the next 90 days, patient will:  - take medications as prescribed collaborate with provider on medication access solutions  Follow Up Plan:  Telephone follow up appointment with care management team member scheduled for: 1 MONTH         The patient verbalized understanding of instructions, educational materials, and care plan provided today and declined offer to receive copy of patient instructions, educational materials, and care plan.   Telephone follow up appointment with care management team member scheduled for: 08/10/21  Signature Regina Eck, PharmD, BCPS Clinical Pharmacist, [place]  II Phone [phone]

## 2021-07-13 ENCOUNTER — Telehealth: Payer: Self-pay | Admitting: *Deleted

## 2021-07-13 NOTE — Telephone Encounter (Signed)
Pt called and aware - meds here from pt assistance  #2 boxes here Ryselsus '7mg'$   Placed up front for pick up

## 2021-07-13 NOTE — Telephone Encounter (Signed)
Error 1st note - pt has #4 boxes total here

## 2021-07-14 LAB — HM DIABETES EYE EXAM

## 2021-07-19 ENCOUNTER — Other Ambulatory Visit: Payer: Self-pay | Admitting: Family Medicine

## 2021-07-19 DIAGNOSIS — G8929 Other chronic pain: Secondary | ICD-10-CM

## 2021-07-21 NOTE — Telephone Encounter (Signed)
Last office visit 06/12/21 Last refill 06/01/21, #30, 2 refills

## 2021-07-21 NOTE — Telephone Encounter (Signed)
TO BE USED ONLY IF NEEDED.

## 2021-08-04 ENCOUNTER — Other Ambulatory Visit: Payer: Self-pay | Admitting: Family Medicine

## 2021-08-04 DIAGNOSIS — E1165 Type 2 diabetes mellitus with hyperglycemia: Secondary | ICD-10-CM

## 2021-08-11 ENCOUNTER — Ambulatory Visit (INDEPENDENT_AMBULATORY_CARE_PROVIDER_SITE_OTHER): Payer: Medicare HMO | Admitting: Pharmacist

## 2021-08-11 DIAGNOSIS — E1165 Type 2 diabetes mellitus with hyperglycemia: Secondary | ICD-10-CM

## 2021-08-11 NOTE — Progress Notes (Signed)
Chronic Care Management Pharmacy Note  08/11/2021 Name:  Erica Evans MRN:  [id number] DOB:  04/15/1951  Summary: T2DM   Recommendations/Changes made from today's visit: Diabetes: Uncontrolled-A1C 8.4% current treatment:RYBELSUS 7MG DAILY, JARDIANCE 10MG, METFORMIN, GLIPIZIDE; GFR 64 INTOLERANCE REPORTED TO FARXIGA, BUT TOLERATING JARDIANCE WELL PLAN TO D/C GLIPIZIDE-CONTINUE FOR NOW CONTINUE RYBELSUS 7MG DAILY SLOWLY TITRATED RYBELSUS DUE TO GI SIDE EFFECTS Denies personal and family history of Medullary thyroid cancer (MTC) INCREASED METFORMIN XR TO 1G INCREASE JARDIANCE TO 25MG DAILY -- SAMPLES LEFT UP FRONT WILL APPLY FOR JARDIANCE ONCE PATIENT INCREASED TO 25MG DAILY (VIA BI CARES PATIENT ASSISTANCE) Current glucose readings: fasting glucose: <160, as low as 124, post prandial glucose: <200 Denies hypoglycemic/hyperglycemic symptoms Discussed meal planning options and Plate method for healthy eating Avoid sugary drinks and desserts Incorporate balanced protein, non starchy veggies, 1 serving of carbohydrate with each meal Increase water intake Increase physical activity as able  Current exercise: N/A Educated on DIABETES, MEDICATIONS Recommended INCREASE JARDIANCE TO 25MG DAILY, F/U IN 1 MONTH  Follow Up Plan: Telephone follow up appointment with care management team member scheduled for: 1 MONTH    Subjective: Erica Evans is an 70 y.o. year old female who is a primary patient of [name], DO.  The CCM team was consulted for assistance with disease management and care coordination needs.    Engaged with patient by telephone for follow up visit in response to provider referral for pharmacy case management and/or care coordination services.   Consent to Services:  The patient was given information about Chronic Care Management services, agreed to services, and gave verbal consent prior to initiation of services.  Please see initial visit  note for detailed documentation.   Patient Care Team: [name], DO as PCP - General (Family Medicine) Sandford Craze, MD as Referring Physician (Dermatology) Leticia Clas, OD (Optometry) Lavera Guise, [facility] as Pharmacist (Family Medicine)   Objective:  Lab Results  Component Value Date   CREATININE 0.97 03/11/2021   CREATININE 0.93 02/08/2020   CREATININE 0.99 07/20/2019    Lab Results  Component Value Date   HGBA1C 8.0 (H) 06/12/2021   Last diabetic Eye exam:  Lab Results  Component Value Date/Time   HMDIABEYEEXA No Retinopathy 07/14/2021 12:00 AM    Last diabetic Foot exam: No results found for: HMDIABFOOTEX      Component Value Date/Time   CHOL 178 03/11/2021 0955   CHOL 119 04/17/2013 1110   TRIG 187 (H) 03/11/2021 0955   TRIG 178 (H) 09/19/2014 0918   TRIG 148 04/17/2013 1110   HDL 56 03/11/2021 0955   HDL 50 09/19/2014 0918   HDL 49 04/17/2013 1110   CHOLHDL 3.2 03/11/2021 0955   LDLCALC 90 03/11/2021 0955   LDLCALC 99 06/14/2014 0859   LDLCALC 40 04/17/2013 1110    Hepatic Function Latest Ref Rng & Units 03/11/2021 02/08/2020 10/17/2018  Total Protein 6.0 - 8.5 g/dL 7.9 6.8 7.0  Albumin 3.8 - 4.8 g/dL 4.9(H) 4.2 4.5  AST 0 - 40 IU/L '20 18 25  ' ALT 0 - 32 IU/L '19 11 27  ' Alk Phosphatase 44 - 121 IU/L 74 64 53  Total Bilirubin 0.0 - 1.2 mg/dL 0.2 <0.2 0.3    Lab Results  Component Value Date/Time   TSH 3.930 03/11/2021 09:55 AM   TSH 3.620 02/08/2020 08:35 AM    CBC Latest Ref Rng & Units 12/27/2017 06/26/2015 04/18/2008  WBC 3.4 - 10.8  x10E3/uL 8.2 7.8 4.9  Hemoglobin 11.1 - 15.9 g/dL 12.9 13.7 12.7  Hematocrit 34.0 - 46.6 % 40.1 40.8 37.1  Platelets 150 - 379 x10E3/uL 441(H) 437(H) 359    Lab Results  Component Value Date/Time   VD25OH 60.7 03/11/2021 09:55 AM   VD25OH 76.8 02/08/2020 08:35 AM    Clinical ASCVD: No  The 10-year ASCVD risk score (Arnett DK, et al., 2019) is: 17.5%   Values used to calculate the score:     Age:  54 years     Sex: Female     Is Non-Hispanic African American: No     Diabetic: Yes     Tobacco smoker: No     Systolic Blood Pressure: 383 mmHg     Is BP treated: Yes     HDL Cholesterol: 56 mg/dL     Total Cholesterol: 178 mg/dL    Other: (CHADS2VASc if Afib, PHQ9 if depression, MMRC or CAT for COPD, ACT, DEXA)  Social History   Tobacco Use  Smoking Status Former   Types: Cigarettes   Quit date: 1990   Years since quitting: 32.7  Smokeless Tobacco Never   BP Readings from Last 3 Encounters:  06/12/21 126/81  04/02/21 135/82  03/11/21 (!) 171/84   Pulse Readings from Last 3 Encounters:  06/12/21 (!) 104  03/11/21 87  07/20/19 94   Wt Readings from Last 3 Encounters:  06/12/21 205 lb 12.8 oz (93.4 kg)  03/11/21 218 lb 9.6 oz (99.2 kg)  02/16/21 216 lb (98 kg)    Assessment: Review of patient past medical history, allergies, medications, health status, including review of consultants reports, laboratory and other test data, was performed as part of comprehensive evaluation and provision of chronic care management services.   SDOH:  (Social Determinants of Health) assessments and interventions performed:    CCM Care Plan  Allergies  Allergen Reactions   Lipitor [Atorvastatin]    Morphine And Related    Farxiga [Dapagliflozin]     UTI/CONSTIPATION TOLERATES JARDIANCE    Medications Reviewed Today     Reviewed by Lavera Guise, [facility] (Pharmacist) on 08/11/21 at 1334  Med List Status: <None>   Medication Order Taking? Sig Documenting Provider Last Dose Status Informant  amLODipine (NORVASC) 10 MG tablet [id number] No Take 1 tablet (10 mg total) by mouth daily. Ronnie Doss M, DO Taking Active   blood glucose meter kit and supplies [id number] No Check BS once daily Timmothy Euler, MD Taking Active   busPIRone (BUSPAR) 15 MG tablet [id number] No TAKE 1/2 TO 1 TABLET EVERY 8 HOURS FOR NERVES Ronnie Doss M, DO Taking Active   celecoxib (CELEBREX) 200 MG  capsule [id number] No TAKE 1 CAPSULE (200 MG TOTAL) BY MOUTH DAILY AS NEEDED FOR MODERATE PAIN. Ronnie Doss M, DO Taking Active   Cholecalciferol (D-3-5) 125 MCG (5000 UT) capsule [id number] No Take 5,000 Units by mouth daily. [provider] Taking Active   cyclobenzaprine (FLEXERIL) 5 MG tablet [id number]  TAKE 1 TABLET BY MOUTH 2 TIMES A DAY AS NEEDED FOR MUSCLE SPASMS. ONLY IF NEEDED Ronnie Doss M, DO  Active   glipiZIDE (GLUCOTROL XL) 10 MG 24 hr tablet [id number] No Take 1 tablet (10 mg total) by mouth daily with breakfast. Ronnie Doss M, DO Taking Active   guaiFENesin (MUCINEX) 600 MG 12 hr tablet [id number] No Take 1-2 tablets (600-1,200 mg total) by mouth 2 (two) times daily as needed for cough or to loosen phlegm. Gwenlyn Perking, FNP  Taking Active   hydrochlorothiazide (HYDRODIURIL) 25 MG tablet [id number] No Take 1 tablet (25 mg total) by mouth daily. Ronnie Doss M, DO Taking Active   JARDIANCE 10 MG TABS tablet [id number]  TAKE 1 TABLET (10 MG TOTAL) BY MOUTH DAILY BEFORE BREAKFAST. [place]. NEED TO SEE Edison Nicholson IN 2 WEEKS FOR INCREASE IN DOSE. Ronnie Doss M, DO  Active            Med Note Lottie Dawson D   Tue Aug 11, 2021  1:33 PM) INCREASE TO 25MG DAILY--WILL LEAVE SAMPLES UP FRONT 08/11/21  metFORMIN (GLUCOPHAGE-XR) 500 MG 24 hr tablet [id number]  Take 2 tablets (1,000 mg total) by mouth daily with supper. Ronnie Doss [place], DO  Active   [facility] VERIO test strip [id number] No USE TO TEST BLOOD SUGAR ONCE DAILY DX E11.9 Ronnie Doss M, DO Taking Active   pravastatin (PRAVACHOL) 40 MG tablet [id number] No Take 1 tablet (40 mg total) by mouth daily. [name], DO Taking Active   Semaglutide 7 MG TABS [id number]  Take 7 mg by mouth daily. [provider]  Active            Med Note Parthenia Ames Jul 09, 2021 11:54 AM) VIA NOVO [place] PATIENT ASSISTANCE            Patient Active Problem List   Diagnosis Date Noted    Hyperlipidemia associated with type 2 diabetes mellitus ([place]) 10/17/2018   Osteoporosis 12/27/2017   Vitamin D deficiency 06/27/2015   BMI 34.0-34.9,adult 06/14/2014   GAD (generalized anxiety disorder) 06/14/2014   Hypertension associated with diabetes ([place]) 04/17/2013   Diabetes ([place]) 04/17/2013    Immunization History  Administered Date(s) Administered   Fluad Quad(high Dose 65+) 08/15/2019, 08/29/2020   Influenza Inj Mdck Quad Pf 09/01/2017   Influenza Split 09/21/2013   Influenza, High Dose Seasonal PF 08/24/2018   Influenza,inj,Quad PF,6+ Mos 09/19/2014, 09/03/2015, 08/04/2016   Influenza-Unspecified 08/30/2017   Moderna SARS-COV2 Booster Vaccination 06/17/2021   Moderna Sars-Covid-2 Vaccination 01/09/2020, 02/06/2020, 10/01/2020   Pneumococcal Conjugate-13 10/20/2012, 10/17/2018   Pneumococcal Polysaccharide-23 10/20/2012   Tdap 07/06/2011    Conditions to be addressed/monitored: DMII  Care Plan : PHARMD MEDICATION MANAGEMENT  Updates made by Lavera Guise, [place] since 08/11/2021 12:00 AM     Problem: DISEASE PROGRESSION PREVENTION      Long-Range Goal: T2DM   Recent Progress: Not on track  Priority: High  Note:   Current Barriers:  Unable to independently afford treatment regimen Unable to maintain control of T2DM  Pharmacist Clinical Goal(s):  Over the next 90 days, patient will verbalize ability to afford treatment regimen maintain control of T2DM as evidenced by IMPROVED GLYCEMIC CONTROL  through collaboration with PharmD and provider.    Interventions: 1:1 collaboration with [name], DO regarding development and update of comprehensive plan of care as evidenced by provider attestation and co-signature Inter-disciplinary care team collaboration (see longitudinal plan of care) Comprehensive medication review performed; medication list updated in electronic medical record  Diabetes: Uncontrolled-A1C 8.4% current treatment:RYBELSUS 7MG DAILY,  JARDIANCE 10MG, METFORMIN, GLIPIZIDE; GFR 64 INTOLERANCE REPORTED TO FARXIGA, BUT TOLERATING JARDIANCE WELL PLAN TO D/C GLIPIZIDE-CONTINUE FOR NOW CONTINUE RYBELSUS 7MG DAILY SLOWLY TITRATED RYBELSUS DUE TO GI SIDE EFFECTS Denies personal and family history of Medullary thyroid cancer (MTC) INCREASED METFORMIN XR TO 1G INCREASE JARDIANCE TO 25MG DAILY -- SAMPLES LEFT UP FRONT WILL APPLY FOR JARDIANCE ONCE PATIENT INCREASED TO 25MG DAILY (VIA BI  CARES PATIENT ASSISTANCE) Current glucose readings: fasting glucose: <160, as low as 124, post prandial glucose: <200 Denies hypoglycemic/hyperglycemic symptoms Discussed meal planning options and Plate method for healthy eating Avoid sugary drinks and desserts Incorporate balanced protein, non starchy veggies, 1 serving of carbohydrate with each meal Increase water intake Increase physical activity as able  Current exercise: N/A Educated on DIABETES, MEDICATIONS Recommended INCREASE JARDIANCE TO 25MG DAILY, F/U IN 1 MONTH   Patient Goals/Self-Care Activities Over the next 90 days, patient will:  - take medications as prescribed collaborate with provider on medication access solutions  Follow Up Plan: Telephone follow up appointment with care management team member scheduled for: 1 MONTH      Medication Assistance:  RYBELSUS 7MG obtained through [place] medication assistance program.  Enrollment ends 11/14/21  Patient's preferred pharmacy is:  [id number] [place] [place]Phone: [phone]: [phone] [id number] [place], [place]one: [phone]: [phone] Follow Up:  Patient agrees to Care Plan and Follow-up.  Plan: Telephone follow up appointment with care management team member scheduled for:  3 WEEKS   JuRegina EckPharmD,  BCPS Clinical Pharmacist, [place] Phone [phone]

## 2021-08-11 NOTE — Patient Instructions (Signed)
Visit Information  PATIENT GOALS:  Goals Addressed               This Visit's Progress     Patient Stated     T2DM-PHARMD GOAL (pt-stated)        Current Barriers:  Unable to independently afford treatment regimen Unable to maintain control of T2DM  Pharmacist Clinical Goal(s):  Over the next 90 days, patient will verbalize ability to afford treatment regimen maintain control of T2DM as evidenced by IMPROVED GLYCEMIC CONTROL  through collaboration with PharmD and provider.    Interventions: 1:1 collaboration with [name], DO regarding development and update of comprehensive plan of care as evidenced by provider attestation and co-signature Inter-disciplinary care team collaboration (see longitudinal plan of care) Comprehensive medication review performed; medication list updated in electronic medical record  Diabetes: Uncontrolled-A1C 8.4% current treatment:RYBELSUS 7MG  DAILY, JARDIANCE 10MG , METFORMIN, GLIPIZIDE; GFR 64 INTOLERANCE REPORTED TO FARXIGA, BUT TOLERATING JARDIANCE WELL PLAN TO D/C GLIPIZIDE-CONTINUE FOR NOW CONTINUE RYBELSUS 7MG  DAILY SLOWLY TITRATED RYBELSUS DUE TO GI SIDE EFFECTS Denies personal and family history of Medullary thyroid cancer (MTC) INCREASED METFORMIN XR TO 1G INCREASE JARDIANCE TO 25MG  DAILY -- SAMPLES LEFT UP FRONT WILL APPLY FOR JARDIANCE ONCE PATIENT INCREASED TO 25MG  DAILY (VIA BI CARES PATIENT ASSISTANCE) Current glucose readings: fasting glucose: <160, as low as 124, post prandial glucose: <200 Denies hypoglycemic/hyperglycemic symptoms Discussed meal planning options and Plate method for healthy eating Avoid sugary drinks and desserts Incorporate balanced protein, non starchy veggies, 1 serving of carbohydrate with each meal Increase water intake Increase physical activity as able  Current exercise: N/A Educated on DIABETES, MEDICATIONS Recommended INCREASE JARDIANCE TO 25MG  DAILY, F/U IN 1 MONTH   Patient  Goals/Self-Care Activities Over the next 90 days, patient will:  - take medications as prescribed collaborate with provider on medication access solutions  Follow Up Plan: Telephone follow up appointment with care management team member scheduled for: 1 MONTH         The patient verbalized understanding of instructions, educational materials, and care plan provided today and declined offer to receive copy of patient instructions, educational materials, and care plan.   Telephone follow up appointment with care management team member scheduled for: 4 WEEKS  Regina Eck, PharmD, BCPS Clinical Pharmacist, [place]  II Phone [phone]

## 2021-08-12 ENCOUNTER — Other Ambulatory Visit (HOSPITAL_COMMUNITY): Payer: Self-pay | Admitting: Family Medicine

## 2021-08-12 DIAGNOSIS — Z1231 Encounter for screening mammogram for malignant neoplasm of breast: Secondary | ICD-10-CM

## 2021-08-14 DIAGNOSIS — E1165 Type 2 diabetes mellitus with hyperglycemia: Secondary | ICD-10-CM | POA: Diagnosis not present

## 2021-08-17 ENCOUNTER — Ambulatory Visit (INDEPENDENT_AMBULATORY_CARE_PROVIDER_SITE_OTHER): Payer: Medicare HMO

## 2021-08-17 DIAGNOSIS — Z23 Encounter for immunization: Secondary | ICD-10-CM | POA: Diagnosis not present

## 2021-09-01 ENCOUNTER — Telehealth: Payer: Self-pay | Admitting: Family Medicine

## 2021-09-01 ENCOUNTER — Telehealth: Payer: Medicare HMO

## 2021-09-01 DIAGNOSIS — E1165 Type 2 diabetes mellitus with hyperglycemia: Secondary | ICD-10-CM

## 2021-09-01 NOTE — Telephone Encounter (Signed)
  Prescription Request  09/01/2021  Is this a "Controlled Substance" medicine? no Have you seen your PCP in the last 2 weeks? Pt was suppose to have appt today with Almyra Free but she was out sick If YES, route message to pool  -  If NO, patient needs to be scheduled for appointment.  What is the name of the medication or equipment?jardiance 25 mg (samples or call in rx  Have you contacted your pharmacy to request a refill?   [facility]   Patient notified that their request is being sent to the clinical staff for review and that they should receive a response within 2 business days.

## 2021-09-01 NOTE — Telephone Encounter (Signed)
Samples left up front for patient and patient aware.

## 2021-09-02 ENCOUNTER — Telehealth: Payer: Self-pay | Admitting: Pharmacist

## 2021-09-02 NOTE — Telephone Encounter (Signed)
Can you please place samples of Jardiance 25mg  up front for patient to pick up (2-3 weeks worth).  Call and let her know when ready.  Thank you!

## 2021-09-03 NOTE — Telephone Encounter (Signed)
Appararently this has been completed 09/02/21. Spoke to pt 09/03/21 and states she picked up 4 boxes 10/19. No documentation of doing so.

## 2021-09-05 ENCOUNTER — Other Ambulatory Visit: Payer: Self-pay | Admitting: Family Medicine

## 2021-09-05 DIAGNOSIS — E1165 Type 2 diabetes mellitus with hyperglycemia: Secondary | ICD-10-CM

## 2021-09-07 ENCOUNTER — Ambulatory Visit (HOSPITAL_COMMUNITY)
Admission: RE | Admit: 2021-09-07 | Discharge: 2021-09-07 | Disposition: A | Discharge status: Home / Self Care | Payer: Medicare HMO | Source: Ambulatory Visit | Attending: Family Medicine | Admitting: Family Medicine

## 2021-09-07 ENCOUNTER — Other Ambulatory Visit: Payer: Self-pay

## 2021-09-07 DIAGNOSIS — Z1231 Encounter for screening mammogram for malignant neoplasm of breast: Secondary | ICD-10-CM | POA: Diagnosis not present

## 2021-09-15 ENCOUNTER — Other Ambulatory Visit: Payer: Self-pay

## 2021-09-15 ENCOUNTER — Encounter: Payer: Self-pay | Admitting: Family Medicine

## 2021-09-15 ENCOUNTER — Ambulatory Visit (INDEPENDENT_AMBULATORY_CARE_PROVIDER_SITE_OTHER): Payer: Medicare HMO | Admitting: Family Medicine

## 2021-09-15 VITALS — BP 141/77 | HR 78 | Temp 97.7°F | Ht 67.0 in | Wt 205.6 lb

## 2021-09-15 DIAGNOSIS — E1169 Type 2 diabetes mellitus with other specified complication: Secondary | ICD-10-CM | POA: Diagnosis not present

## 2021-09-15 DIAGNOSIS — E1165 Type 2 diabetes mellitus with hyperglycemia: Secondary | ICD-10-CM

## 2021-09-15 DIAGNOSIS — I152 Hypertension secondary to endocrine disorders: Secondary | ICD-10-CM

## 2021-09-15 DIAGNOSIS — E1159 Type 2 diabetes mellitus with other circulatory complications: Secondary | ICD-10-CM | POA: Diagnosis not present

## 2021-09-15 DIAGNOSIS — E785 Hyperlipidemia, unspecified: Secondary | ICD-10-CM

## 2021-09-15 LAB — BAYER DCA HB A1C WAIVED: HB A1C (BAYER DCA - WAIVED): 7.9 % — ABNORMAL HIGH (ref 4.8–5.6)

## 2021-09-15 NOTE — Progress Notes (Signed)
Subjective: CC:DM PCP: [name], DO FBP:ZWCHE Erica Evans is a 70 y.o. female presenting to clinic today for:  1. Type 2 Diabetes with hypertension, hyperlipidemia:  Reports compliance with all of her medications.  She thinks that she is on 7 mg of Rybelsus but cannot remember.  No GI side effects from the Rybelsus.  Reports mild and very intermittent burning with increased dose of Jardiance.  No abnormal vaginal discharge or itching reported.  No chest pain, shortness of breath or edema  High at home: 169; Low at home: 120   Last eye exam: UTD Last foot exam: UTD Last A1c:  Lab Results  Component Value Date   HGBA1C 8.0 (H) 06/12/2021   Nephropathy screen indicated?: UTD Last flu, zoster and/or pneumovax:  Immunization History  Administered Date(s) Administered   Fluad Quad(high Dose 65+) 08/15/2019, 08/29/2020, 08/17/2021   Influenza Inj Mdck Quad Pf 09/01/2017   Influenza Split 09/21/2013   Influenza, High Dose Seasonal PF 08/24/2018   Influenza,inj,Quad PF,6+ Mos 09/19/2014, 09/03/2015, 08/04/2016   Influenza-Unspecified 08/30/2017   Moderna SARS-COV2 Booster Vaccination 06/17/2021   Moderna Sars-Covid-2 Vaccination 01/09/2020, 02/06/2020, 10/01/2020   Pneumococcal Conjugate-13 10/20/2012, 10/17/2018   Pneumococcal Polysaccharide-23 10/20/2012   Tdap 07/06/2011    ROS: Per HPI  Allergies  Allergen Reactions   Lipitor [Atorvastatin]    Morphine And Related    Farxiga [Dapagliflozin]     UTI/CONSTIPATION TOLERATES JARDIANCE   Past Medical History:  Diagnosis Date   Diabetes mellitus without complication ([place])    Hyperlipidemia    Hypertension    Palpitations    Vertigo     Current Outpatient Medications:    amLODipine (NORVASC) 10 MG tablet, Take 1 tablet (10 mg total) by mouth daily., Disp: 90 tablet, Rfl: 3   blood glucose meter kit and supplies, Check BS once daily, Disp: 1 each, Rfl: 0   busPIRone (BUSPAR) 15 MG tablet, TAKE 1/2 TO 1  TABLET EVERY 8 HOURS FOR NERVES, Disp: 270 tablet, Rfl: 0   celecoxib (CELEBREX) 200 MG capsule, TAKE 1 CAPSULE (200 MG TOTAL) BY MOUTH DAILY AS NEEDED FOR MODERATE PAIN., Disp: 30 capsule, Rfl: 1   Cholecalciferol (D-3-5) 125 MCG (5000 UT) capsule, Take 5,000 Units by mouth daily., Disp: , Rfl:    cyclobenzaprine (FLEXERIL) 5 MG tablet, TAKE 1 TABLET BY MOUTH 2 TIMES A DAY AS NEEDED FOR MUSCLE SPASMS. ONLY IF NEEDED, Disp: 60 tablet, Rfl: 3   empagliflozin (JARDIANCE) 25 MG TABS tablet, Take 25 mg by mouth daily., Disp: , Rfl:    glipiZIDE (GLUCOTROL XL) 10 MG 24 hr tablet, Take 1 tablet (10 mg total) by mouth daily with breakfast., Disp: 90 tablet, Rfl: 3   guaiFENesin (MUCINEX) 600 MG 12 hr tablet, Take 1-2 tablets (600-1,200 mg total) by mouth 2 (two) times daily as needed for cough or to loosen phlegm., Disp: 30 tablet, Rfl: 0   hydrochlorothiazide (HYDRODIURIL) 25 MG tablet, Take 1 tablet (25 mg total) by mouth daily., Disp: 90 tablet, Rfl: 3   metFORMIN (GLUCOPHAGE-XR) 500 MG 24 hr tablet, Take 2 tablets (1,000 mg total) by mouth daily with supper., Disp: 180 tablet, Rfl: 2   ONETOUCH VERIO test strip, USE TO TEST BLOOD SUGAR ONCE DAILY DX E11.9, Disp: 100 strip, Rfl: 3   pravastatin (PRAVACHOL) 40 MG tablet, Take 1 tablet (40 mg total) by mouth daily., Disp: 90 tablet, Rfl: 3   Semaglutide 7 MG TABS, Take 7 mg by mouth daily., Disp: , Rfl:  Social History   Socioeconomic History   Marital status: Married    Spouse name: [name]   Number of children: 2   Years of education: Not on file   Highest education level: Associate degree: academic program  Occupational History   Occupation: Control and instrumentation engineer    Comment: retired  Tobacco Use   Smoking status: Former    Types: Cigarettes    Quit date: [place] since quitting: 32.8   Smokeless tobacco: Never  Vaping Use   Vaping Use: Never used  Substance and Sexual Activity   Alcohol use: No   Drug use: No   Sexual activity: Not on  file  Other Topics Concern   Not on file  Social History Narrative   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 She lives at home with her husband [name] and her grown son. She has 3 children. She enjoys bird watching and walking.    Social Determinants of Health   Financial Resource Strain: Low Risk    Difficulty of Paying Living Expenses: Not hard at all  Food Insecurity: No Food Insecurity   Worried About Charity fundraiser in the Last Year: Never true   [place] in the Last Year: Never true  Transportation Needs: No Transportation Needs   Lack of Transportation (Medical): No   Lack of Transportation (Non-Medical): No  Physical Activity: Inactive   Days of Exercise per Week: 0 days   Minutes of Exercise per Session: 0 min  Stress: Stress Concern Present   Feeling of Stress : To some extent  Social Connections: Engineer, building services of Communication with Friends and Family: More than three times a week   Frequency of Social Gatherings with Friends and Family: Once a week   Attends Religious Services: More than 4 times per year   Active Member of [employer] or Organizations: Yes   Attends Music therapist: More than 4 times per year   Marital Status: Married  Human resources officer Violence: Not At Risk   Fear of Current or Ex-Partner: No   Emotionally Abused: No   Physically Abused: No   Sexually Abused: No   Family History  Problem Relation Age of Onset   Coronary artery disease Mother    Diabetes Mother    Heart disease Mother    Coronary artery disease Father    Heart disease Father    Diabetes Sister    Hypertension Sister    Heart disease Sister    Diabetes Brother    Diabetes Sister    Arthritis Sister    Hypertension Sister    Diabetes Sister    Hypertension Sister    Hypertension Sister     Objective: Office vital signs reviewed. BP (!) 141/77   Pulse 78   Temp 97.7 F (36.5 Erica)   Ht '5\' 7"'  (1.702 m)   Wt 205 lb 9.6 oz (93.3 kg)    SpO2 97%   BMI 32.20 kg/m   Physical Examination:  General: Awake, alert, well nourished, No acute distress Cardio: regular rate and rhythm, S1S2 heard, no murmurs appreciated Pulm: clear to auscultation bilaterally, no wheezes, rhonchi or rales; normal work of breathing on room air Extremities: warm, well perfused, No edema, cyanosis or clubbing; small hematoma noted on the dorsal aspect of the left hand  Assessment/ Plan: 70 y.o. female   Uncontrolled type 2 diabetes mellitus with hyperglycemia ([place]) - Plan: Bayer DCA Hb A1c Waived  Hypertension associated  with diabetes ([place])  Hyperlipidemia associated with type 2 diabetes mellitus ([place])  Hypercalcemia - Plan: PTH, Intact and Calcium  A1c remains uncontrolled A1c of 7.9 today.  I would like her to advance her Rybelsus to 14 mg.  She will continue medicines otherwise as directed.  She has an appointment coming up with Almyra Free so we will see if she can get set up with a sample of the freestyle libre.  I think she may benefit from this and we might build to get her sugar under control with simply having her be more aware of what her blood sugars run throughout the day  Blood pressures controlled.  Continue current regimen  Not due for fasting lipid  Check PTH and calcium given hypercalcemia noted on last lab draw.  May be related to HCTZ use  No orders of the defined types were placed in this encounter.  No orders of the defined types were placed in this encounter.    [name], DO [place] [phone]

## 2021-09-16 LAB — PTH, INTACT AND CALCIUM
Calcium: 9.5 mg/dL (ref 8.7–10.3)
PTH: 45 pg/mL (ref 15–65)

## 2021-09-25 ENCOUNTER — Ambulatory Visit (INDEPENDENT_AMBULATORY_CARE_PROVIDER_SITE_OTHER): Payer: Medicare HMO | Admitting: Pharmacist

## 2021-09-25 DIAGNOSIS — E785 Hyperlipidemia, unspecified: Secondary | ICD-10-CM

## 2021-09-25 DIAGNOSIS — E1169 Type 2 diabetes mellitus with other specified complication: Secondary | ICD-10-CM

## 2021-09-25 DIAGNOSIS — G720 Drug-induced myopathy: Secondary | ICD-10-CM

## 2021-10-06 ENCOUNTER — Telehealth: Payer: Self-pay | Admitting: Family Medicine

## 2021-10-06 NOTE — Telephone Encounter (Signed)
Returned patients call and she states she wants to talk to julie and will clal back tomorrow

## 2021-10-07 NOTE — Patient Instructions (Signed)
Visit Information  Thank you for taking time to visit with me today. Please don't hesitate to contact me if I can be of assistance to you before our next scheduled telephone appointment.  Following are the goals we discussed today:  Current Barriers:  Unable to independently afford treatment regimen Unable to maintain control of T2DM  Pharmacist Clinical Goal(s):  Over the next 90 days, patient will verbalize ability to afford treatment regimen maintain control of T2DM as evidenced by IMPROVED GLYCEMIC CONTROL  through collaboration with PharmD and provider.    Interventions: 1:1 collaboration with [name], DO regarding development and update of comprehensive plan of care as evidenced by provider attestation and co-signature Inter-disciplinary care team collaboration (see longitudinal plan of care) Comprehensive medication review performed; medication list updated in electronic medical record  Diabetes: Uncontrolled-A1C 8.4-->7.9% (IMPROVING) current treatment:RYBELSUS 7MG  DAILY, JARDIANCE 10MG , METFORMIN, GLIPIZIDE; GFR 64 INTOLERANCE REPORTED TO FARXIGA, BUT TOLERATING JARDIANCE WELL PLAN TO D/C GLIPIZIDE-CONTINUE FOR NOW INCREASE RYBELSUS TO 14MG  DAILY SLOWLY TITRATED RYBELSUS DUE TO GI SIDE EFFECTS Denies personal and family history of Medullary thyroid cancer (MTC) CONTINUE METFORMIN XR 1G CONTINUE JARDIANCE TO 25MG  DAILY -- SAMPLES LEFT UP FRONT APPLICATION SUBMITTED FOR BI CARES PATIENT ASSISTANCECurrent glucose readings: fasting glucose: <160, as low as 124, post prandial glucose: <200 Denies hypoglycemic/hyperglycemic symptoms Discussed meal planning options and Plate method for healthy eating Avoid sugary drinks and desserts Incorporate balanced protein, non starchy veggies, 1 serving of carbohydrate with each meal Increase water intake Increase physical activity as able  Current exercise: N/A Educated on DIABETES, MEDICATIONS Recommended INCREASE  RYBELSUS APPLICATIONS SUBMITTED FOR BI CARES (JARDIANCE) AND NOVO [place] (RYBELSUS) PATIENT ASSISTANCE PROGRAMS  Patient Goals/Self-Care Activities Over the next 90 days, patient will:  - take medications as prescribed collaborate with provider on medication access solutions  Follow Up Plan: Telephone follow up appointment with care management team member scheduled for: 1 MONTH   Please call the care guide team at [phone] if you need to cancel or reschedule your appointment.   The patient verbalized understanding of instructions, educational materials, and care plan provided today and declined offer to receive copy of patient instructions, educational materials, and care plan.   Signature Regina Eck, PharmD, BCPS Clinical Pharmacist, [place]  II Phone [phone]

## 2021-10-07 NOTE — Telephone Encounter (Signed)
Patient aware and verbalized understanding. °

## 2021-10-07 NOTE — Telephone Encounter (Signed)
JARDIANCE SAMPLES LEFT UP FRONT FOR PATIENT SHE CAN BRING FARXIGA BACK TO US--COMPANY WAS PROBABLY SHIPPING HER OLD ORDER  HER JARDIANCE PATIENT ASSISTANCE APPLICATION HAS BEEN [place] LET HER KNOW WHEN APPROVED

## 2021-10-07 NOTE — Telephone Encounter (Signed)
VMB not set up

## 2021-10-07 NOTE — Progress Notes (Signed)
Chronic Care Management Pharmacy Note  09/25/2021 Name:  Erica Evans MRN:  [id number] DOB:  1951/06/22  Summary: T2DM  Recommendations/Changes made from today's visit:  Diabetes: Uncontrolled-A1C 8.4-->7.9% (IMPROVING) current treatment:RYBELSUS 7MG DAILY, JARDIANCE 10MG, METFORMIN, GLIPIZIDE; GFR 64 INTOLERANCE REPORTED TO FARXIGA, BUT TOLERATING JARDIANCE WELL PLAN TO D/C GLIPIZIDE-CONTINUE FOR NOW INCREASE RYBELSUS TO 14MG DAILY SLOWLY TITRATED RYBELSUS DUE TO GI SIDE EFFECTS Denies personal and family history of Medullary thyroid cancer (MTC) CONTINUE METFORMIN XR 1G CONTINUE JARDIANCE TO 25MG DAILY -- SAMPLES LEFT UP FRONT APPLICATION SUBMITTED FOR BI CARES PATIENT ASSISTANCECurrent glucose readings: fasting glucose: <160, as low as 124, post prandial glucose: <200 Denies hypoglycemic/hyperglycemic symptoms Discussed meal planning options and Plate method for healthy eating Avoid sugary drinks and desserts Incorporate balanced protein, non starchy veggies, 1 serving of carbohydrate with each meal Increase water intake Increase physical activity as able  Current exercise: N/A Educated on DIABETES, MEDICATIONS Recommended INCREASE RYBELSUS APPLICATIONS SUBMITTED FOR BI CARES (JARDIANCE) AND NOVO [place] (RYBELSUS) PATIENT ASSISTANCE PROGRAMS REVIEWED CHOLESTEROL MEDICATIONS AND GOALS; NOTED STATIN INDUCED MYOPATHY IN THE PAST  Patient Goals/Self-Care Activities Over the next 90 days, patient will:  - take medications as prescribed collaborate with provider on medication access solutions  Follow Up Plan: Telephone follow up appointment with care management team member scheduled for: 1 MONTH   Subjective: Erica Evans is an 70 y.o. year old female who is a primary patient of [name], DO.  The CCM team was consulted for assistance with disease management and care coordination needs.    Engaged with patient by telephone for follow up visit  in response to provider referral for pharmacy case management and/or care coordination services.   Consent to Services:  The patient was given information about Chronic Care Management services, agreed to services, and gave verbal consent prior to initiation of services.  Please see initial visit note for detailed documentation.   Patient Care Team: [name], DO as PCP - General (Family Medicine) Sandford Craze, MD as Referring Physician (Dermatology) Leticia Clas, OD (Optometry) Lavera Guise, [facility] as Pharmacist (Family Medicine)  Objective:  Lab Results  Component Value Date   CREATININE 0.97 03/11/2021   CREATININE 0.93 02/08/2020   CREATININE 0.99 07/20/2019    Lab Results  Component Value Date   HGBA1C 7.9 (H) 09/15/2021   Last diabetic Eye exam:  Lab Results  Component Value Date/Time   HMDIABEYEEXA No Retinopathy 07/14/2021 12:00 AM    Last diabetic Foot exam: No results found for: HMDIABFOOTEX      Component Value Date/Time   CHOL 178 03/11/2021 0955   CHOL 119 04/17/2013 1110   TRIG 187 (H) 03/11/2021 0955   TRIG 178 (H) 09/19/2014 0918   TRIG 148 04/17/2013 1110   HDL 56 03/11/2021 0955   HDL 50 09/19/2014 0918   HDL 49 04/17/2013 1110   CHOLHDL 3.2 03/11/2021 0955   LDLCALC 90 03/11/2021 0955   LDLCALC 99 06/14/2014 0859   LDLCALC 40 04/17/2013 1110    Hepatic Function Latest Ref Rng & Units 03/11/2021 02/08/2020 10/17/2018  Total Protein 6.0 - 8.5 g/dL 7.9 6.8 7.0  Albumin 3.8 - 4.8 g/dL 4.9(H) 4.2 4.5  AST 0 - 40 IU/L _0 ALT 0 - 32 IU/L _1 Alk Phosphatase 44 - 121 IU/L 74 64 53  Total Bilirubin 0.0 - 1.2 mg/dL 0.2 <0.2 0.3    Lab Results  Component Value  Date/Time   TSH 3.930 03/11/2021 09:55 AM   TSH 3.620 02/08/2020 08:35 AM    CBC Latest Ref Rng & Units 12/27/2017 06/26/2015 04/18/2008  WBC 3.4 - 10.8 x10E3/uL 8.2 7.8 4.9  Hemoglobin 11.1 - 15.9 g/dL 12.9 13.7 12.7  Hematocrit 34.0 - 46.6 % 40.1 40.8 37.1   Platelets 150 - 379 x10E3/uL 441(H) 437(H) 359    Lab Results  Component Value Date/Time   VD25OH 60.7 03/11/2021 09:55 AM   VD25OH 76.8 02/08/2020 08:35 AM    Clinical ASCVD: No  The 10-year ASCVD risk score (Arnett DK, et al., 2019) is: 23.7%   Values used to calculate the score:     Age: 44 years     Sex: Female     Is Non-Hispanic African American: No     Diabetic: Yes     Tobacco smoker: No     Systolic Blood Pressure: 656 mmHg     Is BP treated: Yes     HDL Cholesterol: 56 mg/dL     Total Cholesterol: 178 mg/dL    Other: (CHADS2VASc if Afib, PHQ9 if depression, MMRC or CAT for COPD, ACT, DEXA)  Social History   Tobacco Use  Smoking Status Former   Types: Cigarettes   Quit date: 1990   Years since quitting: 32.9  Smokeless Tobacco Never   BP Readings from Last 3 Encounters:  09/15/21 (!) 141/77  06/12/21 126/81  04/02/21 135/82   Pulse Readings from Last 3 Encounters:  09/15/21 78  06/12/21 (!) 104  03/11/21 87   Wt Readings from Last 3 Encounters:  09/15/21 205 lb 9.6 oz (93.3 kg)  06/12/21 205 lb 12.8 oz (93.4 kg)  03/11/21 218 lb 9.6 oz (99.2 kg)    Assessment: Review of patient past medical history, allergies, medications, health status, including review of consultants reports, laboratory and other test data, was performed as part of comprehensive evaluation and provision of chronic care management services.   SDOH:  (Social Determinants of Health) assessments and interventions performed:    CCM Care Plan  Allergies  Allergen Reactions   Lipitor [Atorvastatin]    Morphine And Related    Farxiga [Dapagliflozin]     UTI/CONSTIPATION TOLERATES JARDIANCE    Medications Reviewed Today     Reviewed by Everlean Cherry, CMA (Certified Medical Assistant) on 09/15/21 at [place] List Status: <None>   Medication Order Taking? Sig Documenting Provider Last Dose Status Informant  amLODipine (NORVASC) 10 MG tablet [id number]  Take 1 tablet (10 mg  total) by mouth daily. Ronnie Doss M, DO  Active   blood glucose meter kit and supplies [id number]  Check BS once daily Timmothy Euler, MD  Active   busPIRone (BUSPAR) 15 MG tablet [id number]  TAKE 1/2 TO 1 TABLET EVERY 8 HOURS FOR NERVES Ronnie Doss M, DO  Active   celecoxib (CELEBREX) 200 MG capsule [id number]  TAKE 1 CAPSULE (200 MG TOTAL) BY MOUTH DAILY AS NEEDED FOR MODERATE PAIN. Ronnie Doss M, DO  Active   Cholecalciferol (D-3-5) 125 MCG (5000 UT) capsule [id number]  Take 5,000 Units by mouth daily. [provider]  Active   cyclobenzaprine (FLEXERIL) 5 MG tablet [id number]  TAKE 1 TABLET BY MOUTH 2 TIMES A DAY AS NEEDED FOR MUSCLE SPASMS. ONLY IF NEEDED Ronnie Doss M, DO  Active   empagliflozin (JARDIANCE) 25 MG TABS tablet [id number]  Take 25 mg by mouth daily. [provider]  Active  Med Note Lavera Guise   Wed Sep 02, 2021  8:31 PM) Samples given   glipiZIDE (GLUCOTROL XL) 10 MG 24 hr tablet [id number]  Take 1 tablet (10 mg total) by mouth daily with breakfast. Ronnie Doss M, DO  Active   guaiFENesin (MUCINEX) 600 MG 12 hr tablet [id number]  Take 1-2 tablets (600-1,200 mg total) by mouth 2 (two) times daily as needed for cough or to loosen phlegm. Gwenlyn Perking, FNP  Active   hydrochlorothiazide (HYDRODIURIL) 25 MG tablet [id number]  Take 1 tablet (25 mg total) by mouth daily. Ronnie Doss M, DO  Active   metFORMIN (GLUCOPHAGE-XR) 500 MG 24 hr tablet [id number]  Take 2 tablets (1,000 mg total) by mouth daily with supper. Ronnie Doss [place], DO  Active   [facility] VERIO test strip [id number]  USE TO TEST BLOOD SUGAR ONCE DAILY DX E11.9 Ronnie Doss M, DO  Active   pravastatin (PRAVACHOL) 40 MG tablet [id number]  Take 1 tablet (40 mg total) by mouth daily. [name], DO  Active   Semaglutide 7 MG TABS [id number]  Take 7 mg by mouth daily. [provider]  Active            Med Note Parthenia Ames  Jul 09, 2021 11:54 AM) VIA NOVO [place] PATIENT ASSISTANCE            Patient Active Problem List   Diagnosis Date Noted   Hyperlipidemia associated with type 2 diabetes mellitus ([place]) 10/17/2018   Osteoporosis 12/27/2017   Vitamin D deficiency 06/27/2015   BMI 34.0-34.9,adult 06/14/2014   GAD (generalized anxiety disorder) 06/14/2014   Hypertension associated with diabetes ([place]) 04/17/2013   Diabetes ([place]) 04/17/2013    Immunization History  Administered Date(s) Administered   Fluad Quad(high Dose 65+) 08/15/2019, 08/29/2020, 08/17/2021   Influenza Inj Mdck Quad Pf 09/01/2017   Influenza Split 09/21/2013   Influenza, High Dose Seasonal PF 08/24/2018   Influenza,inj,Quad PF,6+ Mos 09/19/2014, 09/03/2015, 08/04/2016   Influenza-Unspecified 08/30/2017   Moderna SARS-COV2 Booster Vaccination 06/17/2021   Moderna Sars-Covid-2 Vaccination 01/09/2020, 02/06/2020, 10/01/2020   Pneumococcal Conjugate-13 10/20/2012, 10/17/2018   Pneumococcal Polysaccharide-23 10/20/2012   Tdap 07/06/2011    Conditions to be addressed/monitored: HLD and DMII  Care Plan : PHARMD MEDICATION MANAGEMENT  Updates made by Lavera Guise, [place] since 10/07/2021 12:00 AM     Problem: DISEASE PROGRESSION PREVENTION      Long-Range Goal: T2DM   Recent Progress: Not on track  Priority: High  Note:   Current Barriers:  Unable to independently afford treatment regimen Unable to maintain control of T2DM  Pharmacist Clinical Goal(s):  Over the next 90 days, patient will verbalize ability to afford treatment regimen maintain control of T2DM as evidenced by IMPROVED GLYCEMIC CONTROL  through collaboration with PharmD and provider.    Interventions: 1:1 collaboration with [name], DO regarding development and update of comprehensive plan of care as evidenced by provider attestation and co-signature Inter-disciplinary care team collaboration (see longitudinal plan of care) Comprehensive  medication review performed; medication list updated in electronic medical record  Diabetes: Uncontrolled-A1C 8.4-->7.9% (IMPROVING) current treatment:RYBELSUS 7MG  DAILY, JARDIANCE 10MG , METFORMIN, GLIPIZIDE; GFR 64 INTOLERANCE REPORTED TO FARXIGA, BUT TOLERATING JARDIANCE WELL PLAN TO D/C GLIPIZIDE-CONTINUE FOR NOW INCREASE RYBELSUS TO 14MG  DAILY SLOWLY TITRATED RYBELSUS DUE TO GI SIDE EFFECTS Denies personal and family history of Medullary thyroid cancer (MTC) CONTINUE METFORMIN XR 1G CONTINUE JARDIANCE TO 25MG  DAILY -- SAMPLES LEFT UP FRONT  APPLICATION SUBMITTED FOR BI CARES PATIENT ASSISTANCECurrent glucose readings: fasting glucose: <160, as low as 124, post prandial glucose: <200 Denies hypoglycemic/hyperglycemic symptoms Discussed meal planning options and Plate method for healthy eating Avoid sugary drinks and desserts Incorporate balanced protein, non starchy veggies, 1 serving of carbohydrate with each meal Increase water intake Increase physical activity as able  Current exercise: N/A Educated on DIABETES, MEDICATIONS Recommended INCREASE RYBELSUS APPLICATIONS SUBMITTED FOR BI CARES (JARDIANCE) AND NOVO [place] (RYBELSUS) PATIENT ASSISTANCE PROGRAMS  Patient Goals/Self-Care Activities Over the next 90 days, patient will:  - take medications as prescribed collaborate with provider on medication access solutions  Follow Up Plan: Telephone follow up appointment with care management team member scheduled for: 1 MONTH      Medication Assistance:  [place] IF APPROVED; RYBELSUS VIA NOVO [place] PAP IF APPROVED  Patient's preferred pharmacy is:  [id number] [place] [place] - [id number]: [phone]: [phone] [id number] [address]: [phone]:  [phone] Follow Up:  Patient agrees to Care Plan and Follow-up.  Plan: Telephone follow up appointment with care management team member scheduled for:  1 MONTH  JuRegina EckPharmD, BCPS Clinical Pharmacist, [place] Phone [phone]

## 2021-10-14 DIAGNOSIS — Z7984 Long term (current) use of oral hypoglycemic drugs: Secondary | ICD-10-CM

## 2021-10-14 DIAGNOSIS — E785 Hyperlipidemia, unspecified: Secondary | ICD-10-CM

## 2021-10-14 DIAGNOSIS — E1169 Type 2 diabetes mellitus with other specified complication: Secondary | ICD-10-CM

## 2021-10-21 ENCOUNTER — Ambulatory Visit: Payer: Medicare HMO | Admitting: Pharmacist

## 2021-10-21 DIAGNOSIS — G720 Drug-induced myopathy: Secondary | ICD-10-CM

## 2021-10-21 DIAGNOSIS — E1169 Type 2 diabetes mellitus with other specified complication: Secondary | ICD-10-CM

## 2021-10-21 DIAGNOSIS — E785 Hyperlipidemia, unspecified: Secondary | ICD-10-CM

## 2021-10-30 NOTE — Patient Instructions (Signed)
Visit Information  Thank you for taking time to visit with me today. Please don't hesitate to contact me if I can be of assistance to you before our next scheduled telephone appointment.  Following are the goals we discussed today:  Current Barriers:  Unable to independently afford treatment regimen Unable to maintain control of T2DM  Pharmacist Clinical Goal(s):  Over the next 90 days, patient will verbalize ability to afford treatment regimen maintain control of T2DM as evidenced by IMPROVED GLYCEMIC CONTROL  through collaboration with PharmD and provider.    Interventions: 1:1 collaboration with [name], DO regarding development and update of comprehensive plan of care as evidenced by provider attestation and co-signature Inter-disciplinary care team collaboration (see longitudinal plan of care) Comprehensive medication review performed; medication list updated in electronic medical record  Diabetes: Uncontrolled-A1C 8.4-->7.9% (IMPROVING) current treatment:RYBELSUS 7MG  DAILY, JARDIANCE 25MG , METFORMIN, GLIPIZIDE; GFR 64 INTOLERANCE REPORTED TO FARXIGA, BUT TOLERATING JARDIANCE WELL PLAN TO D/C GLIPIZIDE-CONTINUE FOR NOW INCREASE RYBELSUS TO 14MG  DAILY SLOWLY TITRATED RYBELSUS DUE TO GI SIDE EFFECTS Denies personal and family history of Medullary thyroid cancer (MTC) SUBMITTED DOSE CHANGE TO NOVO [place] PAP CONTINUE METFORMIN XR 1G INCREASE JARDIANCE TO 25MG  DAILY -- SAMPLES LEFT UP FRONT APPLICATION SUBMITTED FOR BI CARES PATIENT ASSISTANCECurrent glucose readings: fasting glucose: <160, as low as 124, post prandial glucose: <200 Denies hypoglycemic/hyperglycemic symptoms Discussed meal planning options and Plate method for healthy eating Avoid sugary drinks and desserts Incorporate balanced protein, non starchy veggies, 1 serving of carbohydrate with each meal Increase water intake Increase physical activity as able  Current exercise: N/A Educated on DIABETES,  MEDICATIONS Recommended INCREASE RYBELSUS APPLICATIONS SUBMITTED FOR BI CARES (JARDIANCE) AND NOVO [place] (RYBELSUS) PATIENT ASSISTANCE PROGRAMS  Patient Goals/Self-Care Activities Over the next 90 days, patient will:  - take medications as prescribed collaborate with provider on medication access solutions  Follow Up Plan: Telephone follow up appointment with care management team member scheduled for: 1 MONTH   Please call the care guide team at [phone] if you need to cancel or reschedule your appointment.   The patient verbalized understanding of instructions, educational materials, and care plan provided today and declined offer to receive copy of patient instructions, educational materials, and care plan.   Signature Regina Eck, PharmD, BCPS Clinical Pharmacist, [place]  II Phone [phone]

## 2021-10-30 NOTE — Progress Notes (Signed)
Chronic Care Management Pharmacy Note  10/21/2021 Name:  Erica Evans MRN:  [id number] DOB:  05-27-1951  Summary: T2DM  Recommendations/Changes made from today's visit:  Diabetes: Uncontrolled-A1C 8.4-->7.9% (IMPROVING) current treatment:RYBELSUS 7MG DAILY, JARDIANCE 25MG, METFORMIN, GLIPIZIDE; GFR 64 INTOLERANCE REPORTED TO FARXIGA, BUT TOLERATING JARDIANCE WELL PLAN TO D/C GLIPIZIDE-CONTINUE FOR NOW INCREASE RYBELSUS TO 14MG DAILY SLOWLY TITRATED RYBELSUS DUE TO GI SIDE EFFECTS Denies personal and family history of Medullary thyroid cancer (MTC) SUBMITTED DOSE CHANGE TO NOVO [place] PAP CONTINUE METFORMIN XR 1G INCREASE JARDIANCE TO 25MG DAILY -- SAMPLES LEFT UP FRONT APPLICATION SUBMITTED FOR BI CARES PATIENT ASSISTANCECurrent glucose readings: fasting glucose: <160, as low as 124, post prandial glucose: <200 Denies hypoglycemic/hyperglycemic symptoms Discussed meal planning options and Plate method for healthy eating Avoid sugary drinks and desserts Incorporate balanced protein, non starchy veggies, 1 serving of carbohydrate with each meal Increase water intake Increase physical activity as able  Current exercise: N/A Educated on DIABETES, MEDICATIONS Recommended INCREASE RYBELSUS APPLICATIONS SUBMITTED FOR BI CARES (JARDIANCE) AND NOVO [place] (RYBELSUS) PATIENT ASSISTANCE PROGRAMS  Patient Goals/Self-Care Activities Over the next 90 days, patient will:  - take medications as prescribed collaborate with provider on medication access solutions  Follow Up Plan: Telephone follow up appointment with care management team member scheduled for: 1 MONTH   Subjective: Erica Evans is an 70 y.o. year old female who is a primary patient of [name], DO.  The CCM team was consulted for assistance with disease management and care coordination needs.    Engaged with patient by telephone for follow up visit in response to provider referral for pharmacy  case management and/or care coordination services.   Consent to Services:  The patient was given information about Chronic Care Management services, agreed to services, and gave verbal consent prior to initiation of services.  Please see initial visit note for detailed documentation.   Patient Care Team: [name], DO as PCP - General (Family Medicine) Sandford Craze, MD as Referring Physician (Dermatology) Leticia Clas, OD (Optometry) Lavera Guise, [facility] as Pharmacist (Family Medicine)  Objective:  Lab Results  Component Value Date   CREATININE 0.97 03/11/2021   CREATININE 0.93 02/08/2020   CREATININE 0.99 07/20/2019    Lab Results  Component Value Date   HGBA1C 7.9 (H) 09/15/2021   Last diabetic Eye exam:  Lab Results  Component Value Date/Time   HMDIABEYEEXA No Retinopathy 07/14/2021 12:00 AM    Last diabetic Foot exam: No results found for: HMDIABFOOTEX      Component Value Date/Time   CHOL 178 03/11/2021 0955   CHOL 119 04/17/2013 1110   TRIG 187 (H) 03/11/2021 0955   TRIG 178 (H) 09/19/2014 0918   TRIG 148 04/17/2013 1110   HDL 56 03/11/2021 0955   HDL 50 09/19/2014 0918   HDL 49 04/17/2013 1110   CHOLHDL 3.2 03/11/2021 0955   LDLCALC 90 03/11/2021 0955   LDLCALC 99 06/14/2014 0859   LDLCALC 40 04/17/2013 1110    Hepatic Function Latest Ref Rng & Units 03/11/2021 02/08/2020 10/17/2018  Total Protein 6.0 - 8.5 g/dL 7.9 6.8 7.0  Albumin 3.8 - 4.8 g/dL 4.9(H) 4.2 4.5  AST 0 - 40 IU/L _0 ALT 0 - 32 IU/L _1 Alk Phosphatase 44 - 121 IU/L 74 64 53  Total Bilirubin 0.0 - 1.2 mg/dL 0.2 <0.2 0.3    Lab Results  Component Value Date/Time   TSH 3.930  03/11/2021 09:55 AM   TSH 3.620 02/08/2020 08:35 AM    CBC Latest Ref Rng & Units 12/27/2017 06/26/2015 04/18/2008  WBC 3.4 - 10.8 x10E3/uL 8.2 7.8 4.9  Hemoglobin 11.1 - 15.9 g/dL 12.9 13.7 12.7  Hematocrit 34.0 - 46.6 % 40.1 40.8 37.1  Platelets 150 - 379 x10E3/uL 441(H) 437(H) 359     Lab Results  Component Value Date/Time   VD25OH 60.7 03/11/2021 09:55 AM   VD25OH 76.8 02/08/2020 08:35 AM    Clinical ASCVD: No  The 10-year ASCVD risk score (Arnett DK, et al., 2019) is: 23.7%   Values used to calculate the score:     Age: 9 years     Sex: Female     Is Non-Hispanic African American: No     Diabetic: Yes     Tobacco smoker: No     Systolic Blood Pressure: 209 mmHg     Is BP treated: Yes     HDL Cholesterol: 56 mg/dL     Total Cholesterol: 178 mg/dL    Other: (CHADS2VASc if Afib, PHQ9 if depression, MMRC or CAT for COPD, ACT, DEXA)  Social History   Tobacco Use  Smoking Status Former   Types: Cigarettes   Quit date: 1990   Years since quitting: 32.9  Smokeless Tobacco Never   BP Readings from Last 3 Encounters:  09/15/21 (!) 141/77  06/12/21 126/81  04/02/21 135/82   Pulse Readings from Last 3 Encounters:  09/15/21 78  06/12/21 (!) 104  03/11/21 87   Wt Readings from Last 3 Encounters:  09/15/21 205 lb 9.6 oz (93.3 kg)  06/12/21 205 lb 12.8 oz (93.4 kg)  03/11/21 218 lb 9.6 oz (99.2 kg)    Assessment: Review of patient past medical history, allergies, medications, health status, including review of consultants reports, laboratory and other test data, was performed as part of comprehensive evaluation and provision of chronic care management services.   SDOH:  (Social Determinants of Health) assessments and interventions performed:    CCM Care Plan  Allergies  Allergen Reactions   Lipitor [Atorvastatin]    Morphine And Related    Farxiga [Dapagliflozin]     UTI/CONSTIPATION TOLERATES JARDIANCE    Medications Reviewed Today     Reviewed by Lavera Guise, [facility] (Pharmacist) on 10/30/21 at 1439  Med List Status: <None>   Medication Order Taking? Sig Documenting Provider Last Dose Status Informant  amLODipine (NORVASC) 10 MG tablet [id number] No Take 1 tablet (10 mg total) by mouth daily. Ronnie Doss M, DO Taking Active    blood glucose meter kit and supplies [id number] No Check BS once daily Timmothy Euler, MD Taking Active   busPIRone (BUSPAR) 15 MG tablet [id number] No TAKE 1/2 TO 1 TABLET EVERY 8 HOURS FOR NERVES Ronnie Doss M, DO Taking Active   celecoxib (CELEBREX) 200 MG capsule [id number] No TAKE 1 CAPSULE (200 MG TOTAL) BY MOUTH DAILY AS NEEDED FOR MODERATE PAIN. Ronnie Doss M, DO Taking Active   Cholecalciferol (D-3-5) 125 MCG (5000 UT) capsule [id number] No Take 5,000 Units by mouth daily. [provider] Taking Active   cyclobenzaprine (FLEXERIL) 5 MG tablet [id number] No TAKE 1 TABLET BY MOUTH 2 TIMES A DAY AS NEEDED FOR MUSCLE SPASMS. ONLY IF NEEDED Ronnie Doss M, DO Taking Active   empagliflozin (JARDIANCE) 25 MG TABS tablet [id number] No Take 25 mg by mouth daily. [provider] Taking Active            Med Note (Tracyann Duffell,  Francile Woolford D   Wed Sep 02, 2021  8:31 PM) Samples given   glipiZIDE (GLUCOTROL XL) 10 MG 24 hr tablet [id number] No Take 1 tablet (10 mg total) by mouth daily with breakfast. Ronnie Doss M, DO Taking Active   guaiFENesin (MUCINEX) 600 MG 12 hr tablet [id number] No Take 1-2 tablets (600-1,200 mg total) by mouth 2 (two) times daily as needed for cough or to loosen phlegm. Gwenlyn Perking, FNP Taking Active   hydrochlorothiazide (HYDRODIURIL) 25 MG tablet [id number] No Take 1 tablet (25 mg total) by mouth daily. Ronnie Doss M, DO Taking Active   metFORMIN (GLUCOPHAGE-XR) 500 MG 24 hr tablet [id number] No Take 2 tablets (1,000 mg total) by mouth daily with supper. Ronnie Doss [place], DO Taking Active   [facility] VERIO test strip [id number] No USE TO TEST BLOOD SUGAR ONCE DAILY DX E11.9 Ronnie Doss M, DO Taking Active   pravastatin (PRAVACHOL) 40 MG tablet [id number] No Take 1 tablet (40 mg total) by mouth daily. [name], DO Taking Active   Semaglutide 7 MG TABS [id number] No Take 14 mg by mouth daily. [provider] Taking  Active            Med Note Parthenia Ames Jul 09, 2021 11:54 AM) VIA NOVO [place] PATIENT ASSISTANCE            Patient Active Problem List   Diagnosis Date Noted   Hyperlipidemia associated with type 2 diabetes mellitus ([place]) 10/17/2018   Osteoporosis 12/27/2017   Vitamin D deficiency 06/27/2015   BMI 34.0-34.9,adult 06/14/2014   GAD (generalized anxiety disorder) 06/14/2014   Hypertension associated with diabetes ([place]) 04/17/2013   Diabetes ([place]) 04/17/2013    Immunization History  Administered Date(s) Administered   Fluad Quad(high Dose 65+) 08/15/2019, 08/29/2020, 08/17/2021   Influenza Inj Mdck Quad Pf 09/01/2017   Influenza Split 09/21/2013   Influenza, High Dose Seasonal PF 08/24/2018   Influenza,inj,Quad PF,6+ Mos 09/19/2014, 09/03/2015, 08/04/2016   Influenza-Unspecified 08/30/2017   Moderna SARS-COV2 Booster Vaccination 06/17/2021   Moderna Sars-Covid-2 Vaccination 01/09/2020, 02/06/2020, 10/01/2020   Pneumococcal Conjugate-13 10/20/2012, 10/17/2018   Pneumococcal Polysaccharide-23 10/20/2012   Tdap 07/06/2011    Conditions to be addressed/monitored: HLD and DMII  Care Plan : PHARMD MEDICATION MANAGEMENT  Updates made by Lavera Guise, [place] since 10/30/2021 12:00 AM     Problem: DISEASE PROGRESSION PREVENTION      Long-Range Goal: T2DM   Recent Progress: Not on track  Priority: High  Note:   Current Barriers:  Unable to independently afford treatment regimen Unable to maintain control of T2DM  Pharmacist Clinical Goal(s):  Over the next 90 days, patient will verbalize ability to afford treatment regimen maintain control of T2DM as evidenced by IMPROVED GLYCEMIC CONTROL  through collaboration with PharmD and provider.    Interventions: 1:1 collaboration with [name], DO regarding development and update of comprehensive plan of care as evidenced by provider attestation and co-signature Inter-disciplinary care team collaboration  (see longitudinal plan of care) Comprehensive medication review performed; medication list updated in electronic medical record  Diabetes: Uncontrolled-A1C 8.4-->7.9% (IMPROVING) current treatment:RYBELSUS 7MG DAILY, JARDIANCE 25MG, METFORMIN, GLIPIZIDE; GFR 64 INTOLERANCE REPORTED TO FARXIGA, BUT TOLERATING JARDIANCE WELL PLAN TO D/C GLIPIZIDE-CONTINUE FOR NOW INCREASE RYBELSUS TO 14MG DAILY SLOWLY TITRATED RYBELSUS DUE TO GI SIDE EFFECTS Denies personal and family history of Medullary thyroid cancer (MTC) SUBMITTED DOSE CHANGE TO NOVO [place] PAP CONTINUE METFORMIN XR 1G INCREASE JARDIANCE TO 25MG DAILY --  SAMPLES LEFT UP FRONT APPLICATION SUBMITTED FOR BI CARES PATIENT ASSISTANCECurrent glucose readings: fasting glucose: <160, as low as 124, post prandial glucose: <200 Denies hypoglycemic/hyperglycemic symptoms Discussed meal planning options and Plate method for healthy eating Avoid sugary drinks and desserts Incorporate balanced protein, non starchy veggies, 1 serving of carbohydrate with each meal Increase water intake Increase physical activity as able  Current exercise: N/A Educated on DIABETES, MEDICATIONS Recommended INCREASE RYBELSUS APPLICATIONS SUBMITTED FOR BI CARES (JARDIANCE) AND NOVO [place] (RYBELSUS) PATIENT ASSISTANCE PROGRAMS  Patient Goals/Self-Care Activities Over the next 90 days, patient will:  - take medications as prescribed collaborate with provider on medication access solutions  Follow Up Plan: Telephone follow up appointment with care management team member scheduled for: 1 MONTH      Medication Assistance: Application for [place] SEE NOTE ABOVE  medication assistance program. in process.  Anticipated assistance start date TBD.  See plan of care for additional detail.  Patient's preferred pharmacy is:  [id number] [place] [place]Phone:  [phone]: [phone] [id number] [place]: [phone]: [phone] Up:  Patient agrees to Care Plan and Follow-up.  Plan: Telephone follow up appointment with care management team member scheduled for:  2 MONTHS  JuRegina EckPharmD, BCPS Clinical Pharmacist, [place] Phone [phone]

## 2021-11-04 NOTE — Progress Notes (Signed)
Received notification from [place] regarding RE-ENROLLMENT approval for RYBELSUS 14MG . Patient assistance approved from 11/15/21 to 11/14/22.  Phone: [phone]

## 2021-11-20 NOTE — Progress Notes (Signed)
Received notification from [place] [employer]) regarding approval for [employer]. Patient assistance approved from 11/18/21 to 11/14/22.  Phone: [phone]

## 2021-11-22 ENCOUNTER — Other Ambulatory Visit: Payer: Self-pay | Admitting: Family Medicine

## 2021-11-22 DIAGNOSIS — F411 Generalized anxiety disorder: Secondary | ICD-10-CM

## 2021-11-23 ENCOUNTER — Telehealth: Payer: Self-pay | Admitting: Family Medicine

## 2021-11-23 NOTE — Telephone Encounter (Signed)
Pt wants to let Almyra Free know that she has another week left of jardiance. She says that she got a letter of approval for empagliflozin (JARDIANCE) 25 MG TABS tablet. She does not want to be completely out. Pt aware Almyra Free will see message tomorrow.

## 2021-11-24 ENCOUNTER — Telehealth: Payer: Self-pay | Admitting: Family Medicine

## 2021-11-24 NOTE — Telephone Encounter (Signed)
Pt is supposed to be taking rybelsus and jardiance but said that she was sent farxiga again. Please call back and advise.

## 2021-11-24 NOTE — Telephone Encounter (Signed)
Any idea?

## 2021-11-24 NOTE — Telephone Encounter (Signed)
Instructed patient to call and cancel farxiga prescription She is now taking jardiance and rybelsus

## 2021-11-24 NOTE — Telephone Encounter (Signed)
No worries! She is on jardiance and rybelsus and approved for patient assistance (for both)  [employer] [place] keeps sending it.  I will have patient call and cancel.

## 2021-12-31 ENCOUNTER — Ambulatory Visit (INDEPENDENT_AMBULATORY_CARE_PROVIDER_SITE_OTHER): Payer: Medicare HMO | Admitting: Pharmacist

## 2021-12-31 ENCOUNTER — Other Ambulatory Visit: Payer: Self-pay | Admitting: Family Medicine

## 2021-12-31 DIAGNOSIS — I152 Hypertension secondary to endocrine disorders: Secondary | ICD-10-CM

## 2021-12-31 DIAGNOSIS — E1159 Type 2 diabetes mellitus with other circulatory complications: Secondary | ICD-10-CM

## 2021-12-31 DIAGNOSIS — E785 Hyperlipidemia, unspecified: Secondary | ICD-10-CM

## 2021-12-31 DIAGNOSIS — E119 Type 2 diabetes mellitus without complications: Secondary | ICD-10-CM

## 2021-12-31 DIAGNOSIS — E1169 Type 2 diabetes mellitus with other specified complication: Secondary | ICD-10-CM

## 2021-12-31 NOTE — Patient Instructions (Signed)
Visit Information  Following are the goals we discussed today:  Current Barriers:  Unable to independently afford treatment regimen Unable to maintain control of T2DM  Pharmacist Clinical Goal(s):  Over the next 90 days, patient will verbalize ability to afford treatment regimen maintain control of T2DM as evidenced by IMPROVED GLYCEMIC CONTROL  through collaboration with PharmD and provider.    Interventions: 1:1 collaboration with [name], DO regarding development and update of comprehensive plan of care as evidenced by provider attestation and co-signature Inter-disciplinary care team collaboration (see longitudinal plan of care) Comprehensive medication review performed; medication list updated in electronic medical record  Diabetes: Uncontrolled-A1C 8.4-->7.9% (IMPROVING) current treatment:RYBELSUS 14MG  DAILY, JARDIANCE 25MG , METFORMIN, GLIPIZIDE; GFR 64 INTOLERANCE REPORTED TO FARXIGA, BUT TOLERATING [place] TO D/C GLIPIZIDE CONTINUE RYBELSUS TO 14MG  DAILY SLOWLY TITRATED RYBELSUS DUE TO GI SIDE EFFECTS Denies personal and family history of Medullary thyroid cancer (MTC) SUBMITTED DOSE CHANGE TO NOVO [place] PAP CONTINUE METFORMIN XR 1G CONTINUE JARDIANCE 25MG  DAILY  PATIENT APPROVED FOR BI CARES PATIENT ASSISTANCE UNTIL 11/14/22 Current glucose readings: fasting glucose: <160, as low as 124, post prandial glucose: <200 Denies hypoglycemic/hyperglycemic symptoms Discussed meal planning options and Plate method for healthy eating Avoid sugary drinks and desserts Incorporate balanced protein, non starchy veggies, 1 serving of carbohydrate with each meal Increase water intake Increase physical activity as able  Current exercise: N/A Educated on DIABETES, MEDICATIONS PATIENT APPROVED FOR BI CARES (JARDIANCE) AND NOVO [place] (RYBELSUS) PATIENT ASSISTANCE PROGRAMS UNTIL 11/14/22  Patient Goals/Self-Care Activities Over the next 90 days, patient will:   - take medications as prescribed collaborate with provider on medication access solutions  Follow Up Plan: Telephone follow up appointment with care management team member scheduled for: 4 MONTHS  Plan: Telephone follow up appointment with care management team member scheduled for:  4 months  Signature Regina Eck, PharmD, BCPS Clinical Pharmacist, [place]  II Phone [phone]   Please call the care guide team at [phone] if you need to cancel or reschedule your appointment.   The patient verbalized understanding of instructions, educational materials, and care plan provided today and declined offer to receive copy of patient instructions, educational materials, and care plan.

## 2021-12-31 NOTE — Progress Notes (Signed)
Chronic Care Management Pharmacy Note  12/31/2021 Name:  Erica Evans MRN:  [id number] DOB:  1951-10-29  Summary: T2DM  Recommendations/Changes made from today's visit: Diabetes: Uncontrolled-A1C 8.4-->7.9% (IMPROVING) current treatment:RYBELSUS 14MG DAILY, JARDIANCE 25MG, METFORMIN, GLIPIZIDE; GFR 64 INTOLERANCE REPORTED TO FARXIGA, BUT TOLERATING JARDIANCE WELL PLAN TO D/C GLIPIZIDE CONTINUE RYBELSUS TO 14MG DAILY SLOWLY TITRATED RYBELSUS DUE TO GI SIDE EFFECTS Denies personal and family history of Medullary thyroid cancer (MTC) SUBMITTED DOSE CHANGE TO NOVO [place] PAP CONTINUE METFORMIN XR 1G CONTINUE JARDIANCE 25MG DAILY  PATIENT APPROVED FOR BI CARES PATIENT ASSISTANCE UNTIL 11/14/22 Current glucose readings: fasting glucose: <160, as low as 124, post prandial glucose: <200 Denies hypoglycemic/hyperglycemic symptoms Discussed meal planning options and Plate method for healthy eating Avoid sugary drinks and desserts Incorporate balanced protein, non starchy veggies, 1 serving of carbohydrate with each meal Increase water intake Increase physical activity as able  Current exercise: N/A Educated on DIABETES, MEDICATIONS PATIENT APPROVED FOR BI CARES (JARDIANCE) AND NOVO [place] (RYBELSUS) PATIENT ASSISTANCE PROGRAMS UNTIL 11/14/22  Subjective: Annette Bertelson is an 71 y.o. year old female who is a primary patient of [name], DO.  The CCM team was consulted for assistance with disease management and care coordination needs.    Engaged with patient by telephone for follow up visit in response to provider referral for pharmacy case management and/or care coordination services.   Consent to Services:  The patient was given information about Chronic Care Management services, agreed to services, and gave verbal consent prior to initiation of services.  Please see initial visit note for detailed documentation.   Patient Care Team: [name], DO as PCP - General (Family Medicine) Sandford Craze, MD as Referring Physician (Dermatology) Leticia Clas, OD (Optometry) Lavera Guise, [facility] as Pharmacist (Family Medicine)  Objective:  Lab Results  Component Value Date   CREATININE 0.97 03/11/2021   CREATININE 0.93 02/08/2020   CREATININE 0.99 07/20/2019    Lab Results  Component Value Date   HGBA1C 7.9 (H) 09/15/2021   Last diabetic Eye exam:  Lab Results  Component Value Date/Time   HMDIABEYEEXA No Retinopathy 07/14/2021 12:00 AM    Last diabetic Foot exam: No results found for: HMDIABFOOTEX      Component Value Date/Time   CHOL 178 03/11/2021 0955   CHOL 119 04/17/2013 1110   TRIG 187 (H) 03/11/2021 0955   TRIG 178 (H) 09/19/2014 0918   TRIG 148 04/17/2013 1110   HDL 56 03/11/2021 0955   HDL 50 09/19/2014 0918   HDL 49 04/17/2013 1110   CHOLHDL 3.2 03/11/2021 0955   LDLCALC 90 03/11/2021 0955   LDLCALC 99 06/14/2014 0859   LDLCALC 40 04/17/2013 1110    Hepatic Function Latest Ref Rng & Units 03/11/2021 02/08/2020 10/17/2018  Total Protein 6.0 - 8.5 g/dL 7.9 6.8 7.0  Albumin 3.8 - 4.8 g/dL 4.9(H) 4.2 4.5  AST 0 - 40 IU/L _0 ALT 0 - 32 IU/L _1 Alk Phosphatase 44 - 121 IU/L 74 64 53  Total Bilirubin 0.0 - 1.2 mg/dL 0.2 <0.2 0.3    Lab Results  Component Value Date/Time   TSH 3.930 03/11/2021 09:55 AM   TSH 3.620 02/08/2020 08:35 AM    CBC Latest Ref Rng & Units 12/27/2017 06/26/2015 04/18/2008  WBC 3.4 - 10.8 x10E3/uL 8.2 7.8 4.9  Hemoglobin 11.1 - 15.9 g/dL 12.9 13.7 12.7  Hematocrit 34.0 - 46.6 % 40.1  40.8 37.1  Platelets 150 - 379 x10E3/uL 441(H) 437(H) 359    Lab Results  Component Value Date/Time   VD25OH 60.7 03/11/2021 09:55 AM   VD25OH 76.8 02/08/2020 08:35 AM    Clinical ASCVD: No  The 10-year ASCVD risk score (Arnett DK, et al., 2019) is: 23.7%   Values used to calculate the score:     Age: 71 years     Sex: Female     Is Non-Hispanic African American: No      Diabetic: Yes     Tobacco smoker: No     Systolic Blood Pressure: 973 mmHg     Is BP treated: Yes     HDL Cholesterol: 56 mg/dL     Total Cholesterol: 178 mg/dL    Other: (CHADS2VASc if Afib, PHQ9 if depression, MMRC or CAT for COPD, ACT, DEXA)  Social History   Tobacco Use  Smoking Status Former   Types: Cigarettes   Quit date: 1990   Years since quitting: 33.1  Smokeless Tobacco Never   BP Readings from Last 3 Encounters:  09/15/21 (!) 141/77  06/12/21 126/81  04/02/21 135/82   Pulse Readings from Last 3 Encounters:  09/15/21 78  06/12/21 (!) 104  03/11/21 87   Wt Readings from Last 3 Encounters:  09/15/21 205 lb 9.6 oz (93.3 kg)  06/12/21 205 lb 12.8 oz (93.4 kg)  03/11/21 218 lb 9.6 oz (99.2 kg)    Assessment: Review of patient past medical history, allergies, medications, health status, including review of consultants reports, laboratory and other test data, was performed as part of comprehensive evaluation and provision of chronic care management services.   SDOH:  (Social Determinants of Health) assessments and interventions performed:    CCM Care Plan  Allergies  Allergen Reactions   Lipitor [Atorvastatin]    Morphine And Related    Farxiga [Dapagliflozin]     UTI/CONSTIPATION TOLERATES JARDIANCE    Medications Reviewed Today     Reviewed by Lavera Guise, [facility] (Pharmacist) on 12/31/21 at [place] List Status: <None>   Medication Order Taking? Sig Documenting Provider Last Dose Status Informant  amLODipine (NORVASC) 10 MG tablet [id number]  Take 1 tablet (10 mg total) by mouth daily. (NEEDS TO BE SEEN BEFORE NEXT REFILL) Ronnie Doss M, DO  Active   blood glucose meter kit and supplies [id number] No Check BS once daily Timmothy Euler, MD Taking Active   busPIRone (BUSPAR) 15 MG tablet [id number]  TAKE 1/2 TO 1 TABLET EVERY 8 HOURS FOR NERVES Ronnie Doss M, DO  Active   celecoxib (CELEBREX) 200 MG capsule [id number] No TAKE 1 CAPSULE (200  MG TOTAL) BY MOUTH DAILY AS NEEDED FOR MODERATE PAIN. Ronnie Doss M, DO Taking Active   Cholecalciferol (D-3-5) 125 MCG (5000 UT) capsule [id number] No Take 5,000 Units by mouth daily. [provider] Taking Active   cyclobenzaprine (FLEXERIL) 5 MG tablet [id number] No TAKE 1 TABLET BY MOUTH 2 TIMES A DAY AS NEEDED FOR MUSCLE SPASMS. ONLY IF NEEDED Ronnie Doss M, DO Taking Active   empagliflozin (JARDIANCE) 25 MG TABS tablet [id number] No Take 25 mg by mouth daily. [provider] Taking Active            Med Note Parthenia Ames Dec 31, 2021  3:43 PM) Via [facility] Cares patient assistance program  glipiZIDE (GLUCOTROL XL) 10 MG 24 hr tablet [id number] No Take 1 tablet (10 mg total) by mouth daily with breakfast. Lajuana Ripple,  Koleen Distance, DO Taking Active   guaiFENesin (MUCINEX) 600 MG 12 hr tablet [id number] No Take 1-2 tablets (600-1,200 mg total) by mouth 2 (two) times daily as needed for cough or to loosen phlegm. Gwenlyn Perking, FNP Taking Active   hydrochlorothiazide (HYDRODIURIL) 25 MG tablet [id number]  Take 1 tablet (25 mg total) by mouth daily. (NEEDS TO BE SEEN BEFORE NEXT REFILL) Ronnie Doss M, DO  Active   metFORMIN (GLUCOPHAGE-XR) 500 MG 24 hr tablet [id number] No Take 2 tablets (1,000 mg total) by mouth daily with supper. Ronnie Doss [place], DO Taking Active   [facility] VERIO test strip [id number] No USE TO TEST BLOOD SUGAR ONCE DAILY DX E11.9 Ronnie Doss M, DO Taking Active   pravastatin (PRAVACHOL) 40 MG tablet [id number] No Take 1 tablet (40 mg total) by mouth daily. [name], DO Taking Active   Semaglutide 7 MG TABS [id number] No Take 14 mg by mouth daily. [provider] Taking Active            Med Note Parthenia Ames Jul 09, 2021 11:54 AM) VIA NOVO [place] PATIENT ASSISTANCE            Patient Active Problem List   Diagnosis Date Noted   Hyperlipidemia associated with type 2 diabetes mellitus ([place]) 10/17/2018    Osteoporosis 12/27/2017   Vitamin D deficiency 06/27/2015   BMI 34.0-34.9,adult 06/14/2014   GAD (generalized anxiety disorder) 06/14/2014   Hypertension associated with diabetes ([place]) 04/17/2013   Diabetes ([place]) 04/17/2013    Immunization History  Administered Date(s) Administered   Fluad Quad(high Dose 65+) 08/15/2019, 08/29/2020, 08/17/2021   Influenza Inj Mdck Quad Pf 09/01/2017   Influenza Split 09/21/2013   Influenza, High Dose Seasonal PF 08/24/2018   Influenza,inj,Quad PF,6+ Mos 09/19/2014, 09/03/2015, 08/04/2016   Influenza-Unspecified 08/30/2017   Moderna Covid-19 Vaccine Bivalent Booster 81yr & up 10/14/2021   Moderna SARS-COV2 Booster Vaccination 06/17/2021   Moderna Sars-Covid-2 Vaccination 01/09/2020, 02/06/2020, 10/01/2020   Pneumococcal Conjugate-13 10/20/2012, 10/17/2018   Pneumococcal Polysaccharide-23 10/20/2012   Tdap 07/06/2011    Conditions to be addressed/monitored: DMII  Care Plan : PHARMD MEDICATION MANAGEMENT  Updates made by PLavera Guise RUnion [place] 12/31/2021 12:00 AM     Problem: DISEASE PROGRESSION PREVENTION      Long-Range Goal: T2DM   Recent Progress: Not on track  Priority: High  Note:   Current Barriers:  Unable to independently afford treatment regimen Unable to maintain control of T2DM  Pharmacist Clinical Goal(s):  Over the next 90 days, patient will verbalize ability to afford treatment regimen maintain control of T2DM as evidenced by IMPROVED GLYCEMIC CONTROL  through collaboration with PharmD and provider.    Interventions: 1:1 collaboration with GJanora Norlander DO regarding development and update of comprehensive plan of care as evidenced by provider attestation and co-signature Inter-disciplinary care team collaboration (see longitudinal plan of care) Comprehensive medication review performed; medication list updated in electronic medical record  Diabetes: Uncontrolled-A1C 8.4-->7.9% (IMPROVING) current  treatment:RYBELSUS 14MG DAILY, JARDIANCE 25MG, METFORMIN, GLIPIZIDE; GFR 64 INTOLERANCE REPORTED TO FARXIGA, BUT TOLERATING JARDIANCE WELL PLAN TO D/C GLIPIZIDE CONTINUE RYBELSUS TO 14MG DAILY SLOWLY TITRATED RYBELSUS DUE TO GI SIDE EFFECTS Denies personal and family history of Medullary thyroid cancer (MTC) SUBMITTED DOSE CHANGE TO NOVO [place] PAP CONTINUE METFORMIN XR 1G CONTINUE JARDIANCE 25MG DAILY  PATIENT APPROVED FOR BI CARES PATIENT ASSISTANCE UNTIL 11/14/22 Current glucose readings: fasting glucose: <160, as low as 124, post prandial glucose: <200 Denies  hypoglycemic/hyperglycemic symptoms Discussed meal planning options and Plate method for healthy eating Avoid sugary drinks and desserts Incorporate balanced protein, non starchy veggies, 1 serving of carbohydrate with each meal Increase water intake Increase physical activity as able  Current exercise: N/A Educated on DIABETES, MEDICATIONS PATIENT APPROVED FOR BI CARES (JARDIANCE) AND NOVO [place] (RYBELSUS) PATIENT ASSISTANCE PROGRAMS UNTIL 11/14/22  Patient Goals/Self-Care Activities Over the next 90 days, patient will:  - take medications as prescribed collaborate with provider on medication access solutions  Follow Up Plan: Telephone follow up appointment with care management team member scheduled for: 4 MONTHS     [facility]  [id number] [place] [place] [place]: [phone]: [phone] [id number] [place], [place]: [phone]: [phone] Up:  Patient agrees to Care Plan and Follow-up.  Plan: Telephone follow up appointment with care management team member scheduled for:  4 months   JuRegina EckPharmD, BCPS Clinical Pharmacist, [employer]  Phone [phone]

## 2022-01-05 ENCOUNTER — Ambulatory Visit: Payer: Medicare HMO | Admitting: Nurse Practitioner

## 2022-01-05 ENCOUNTER — Ambulatory Visit (INDEPENDENT_AMBULATORY_CARE_PROVIDER_SITE_OTHER): Payer: Medicare HMO

## 2022-01-05 ENCOUNTER — Encounter: Payer: Self-pay | Admitting: Nurse Practitioner

## 2022-01-05 VITALS — BP 128/69 | HR 85 | Temp 98.1°F | Ht 67.0 in | Wt 203.0 lb

## 2022-01-05 DIAGNOSIS — M545 Low back pain, unspecified: Secondary | ICD-10-CM | POA: Diagnosis not present

## 2022-01-05 LAB — URINALYSIS, ROUTINE W REFLEX MICROSCOPIC
Bilirubin, UA: NEGATIVE
Ketones, UA: NEGATIVE
Leukocytes,UA: NEGATIVE
Nitrite, UA: NEGATIVE
Protein,UA: NEGATIVE
RBC, UA: NEGATIVE
Specific Gravity, UA: 1.01 (ref 1.005–1.030)
Urobilinogen, Ur: 0.2 mg/dL (ref 0.2–1.0)
pH, UA: 6.5 (ref 5.0–7.5)

## 2022-01-05 LAB — MICROSCOPIC EXAMINATION
Bacteria, UA: NONE SEEN
RBC, Urine: NONE SEEN /hpf (ref 0–2)
Renal Epithel, UA: NONE SEEN /hpf

## 2022-01-05 MED ORDER — METHYLPREDNISOLONE ACETATE 40 MG/ML IJ SUSP
40.0000 mg | Freq: Once | INTRAMUSCULAR | Status: AC
  Administered 2022-01-05: 40 mg via INTRAMUSCULAR

## 2022-01-05 MED ORDER — KETOROLAC TROMETHAMINE 60 MG/2ML IM SOLN
60.0000 mg | Freq: Once | INTRAMUSCULAR | Status: AC
  Administered 2022-01-05: 60 mg via INTRAMUSCULAR

## 2022-01-05 NOTE — Patient Instructions (Signed)

## 2022-01-05 NOTE — Addendum Note (Signed)
Addended by: Chevis Pretty on: 01/05/2022 09:04 AM   Modules accepted: Orders

## 2022-01-05 NOTE — Progress Notes (Signed)
° °  Subjective:    Patient ID: Erica Evans, female    DOB: [birth year], 71 y.o.   MRN: [id number]   Chief Complaint: Back Pain   Back Pain This is a new problem. The current episode started 1 to 4 weeks ago. The problem occurs constantly. The problem has been gradually worsening since onset. The pain is present in the lumbar spine. The pain does not radiate. The pain is at a severity of 9/10. The pain is moderate. The symptoms are aggravated by sitting, twisting and bending. Pertinent negatives include no dysuria, numbness, paresthesias, tingling or weakness. She has tried NSAIDs and muscle relaxant for the symptoms. The treatment provided mild relief.  She went to urgent care on 12/29/21 and they really did not do anything other then give her a shot of toradol, which she said really helped.   Lab Results  Component Value Date   HGBA1C 7.9 (H) 09/15/2021   Blood sugar 159 this morning  Review of Systems  Genitourinary:  Negative for dysuria.  Musculoskeletal:  Positive for back pain.  Neurological:  Negative for tingling, weakness, numbness and paresthesias.      Objective:   Physical Exam Vitals reviewed.  Constitutional:      Appearance: Normal appearance. She is obese.  Cardiovascular:     Rate and Rhythm: Normal rate and regular rhythm.     Heart sounds: Normal heart sounds.  Pulmonary:     Effort: Pulmonary effort is normal.     Breath sounds: Normal breath sounds.  Musculoskeletal:     Comments: rise slowly from sitting to standing. Decrease ROM of lumbar spine with pain on flexion and rotation. (-) SLR bil Motor strength and sensation distally intact  Neurological:     Mental Status: She is alert.    BP 128/69    Pulse 85    Temp 98.1 F (36.7 C) (Temporal)    Ht 5\' 7"  (1.702 m)    Wt 203 lb (92.1 kg)    BMI 31.79 kg/m   Urine clear      Assessment & Plan:   Erica Evans in today with chief complaint of Back Pain   1. Acute midline low  back pain without sciatica Moist heat  Rest [facility] blood sugar the next 2 days  - Urinalysis, Routine w reflex microscopic - ketorolac (TORADOL) injection 60 mg - methylPREDNISolone acetate (DEPO-MEDROL) injection 40 mg  RTO prn  The above assessment and management plan was discussed with the patient. The patient verbalized understanding of and has agreed to the management plan. Patient is aware to call the clinic if symptoms persist or worsen. Patient is aware when to return to the clinic for a follow-up visit. Patient educated on when it is appropriate to go to the emergency department.   Mary-Margaret Hassell Done, FNP

## 2022-01-08 ENCOUNTER — Encounter: Payer: Self-pay | Admitting: Family Medicine

## 2022-01-08 ENCOUNTER — Ambulatory Visit (INDEPENDENT_AMBULATORY_CARE_PROVIDER_SITE_OTHER): Payer: Medicare HMO | Admitting: Family Medicine

## 2022-01-08 DIAGNOSIS — M545 Low back pain, unspecified: Secondary | ICD-10-CM | POA: Diagnosis not present

## 2022-01-08 MED ORDER — DICLOFENAC SODIUM 75 MG PO TBEC: 75.0000 mg | DELAYED_RELEASE_TABLET | Freq: Two times a day (BID) | ORAL | 0 refills | Status: DC | PRN

## 2022-01-08 MED ORDER — LIDOCAINE 5 % EX PTCH: 1.0000 | MEDICATED_PATCH | CUTANEOUS | 0 refills | Status: DC

## 2022-01-08 MED ORDER — METHOCARBAMOL 500 MG PO TABS: 500.0000 mg | ORAL_TABLET | Freq: Two times a day (BID) | ORAL | 0 refills | Status: DC | PRN

## 2022-01-08 NOTE — Telephone Encounter (Signed)
Did phone visit with her today.

## 2022-01-08 NOTE — Progress Notes (Signed)
Telephone visit  Subjective: Erica Evans pain PCP: [name], DO TRZ:NBVAP C [place] Erica Evans is a 71 y.o. female calls for telephone consult today. Patient provides verbal consent for consult held via phone.  Due to COVID-19 pandemic this visit was conducted virtually. This visit type was conducted due to national recommendations for restrictions regarding the COVID-19 Pandemic (e.g. social distancing, sheltering in place) in an effort to limit this patient's exposure and mitigate transmission in our community. All issues noted in this document were discussed and addressed.  A physical exam was not performed with this format.   Location of patient: home Location of provider: [facility] Others present for call:  none  1. Back pain She reports back pain gotten worse again. Denies radiation to legs.  Using celebrex but it isn't helping.  She had a toradol and depo medrol shot a few days ago and that helped but it has stopped working.  No bowel or bladder incontinence.  No weakness in legs.  No falls.  She finds it difficult to rise from a seated position due to pain.  Not currently taking physical therapy but would be willing   ROS: Per HPI  Allergies  Allergen Reactions   Lipitor [Atorvastatin]    Morphine And Related    Farxiga [Dapagliflozin]     UTI/CONSTIPATION TOLERATES JARDIANCE   Past Medical History:  Diagnosis Date   Diabetes mellitus without complication ([place])    Hyperlipidemia    Hypertension    Palpitations    Vertigo     Current Outpatient Medications:    amLODipine (NORVASC) 10 MG tablet, Take 1 tablet (10 mg total) by mouth daily. (NEEDS TO BE SEEN BEFORE NEXT REFILL), Disp: 30 tablet, Rfl: 0   blood glucose meter kit and supplies, Check BS once daily, Disp: 1 each, Rfl: 0   busPIRone (BUSPAR) 15 MG tablet, TAKE 1/2 TO 1 TABLET EVERY 8 HOURS FOR NERVES, Disp: 270 tablet, Rfl: 3   celecoxib (CELEBREX) 200 MG capsule, TAKE 1 CAPSULE (200 MG TOTAL) BY MOUTH DAILY AS  NEEDED FOR MODERATE PAIN., Disp: 30 capsule, Rfl: 1   Cholecalciferol (D-3-5) 125 MCG (5000 UT) capsule, Take 5,000 Units by mouth daily., Disp: , Rfl:    cyclobenzaprine (FLEXERIL) 5 MG tablet, TAKE 1 TABLET BY MOUTH 2 TIMES A DAY AS NEEDED FOR MUSCLE SPASMS. ONLY IF NEEDED, Disp: 60 tablet, Rfl: 3   empagliflozin (JARDIANCE) 25 MG TABS tablet, Take 25 mg by mouth daily., Disp: , Rfl:    glipiZIDE (GLUCOTROL XL) 10 MG 24 hr tablet, Take 1 tablet (10 mg total) by mouth daily with breakfast., Disp: 90 tablet, Rfl: 3   hydrochlorothiazide (HYDRODIURIL) 25 MG tablet, Take 1 tablet (25 mg total) by mouth daily. (NEEDS TO BE SEEN BEFORE NEXT REFILL), Disp: 30 tablet, Rfl: 0   metFORMIN (GLUCOPHAGE-XR) 500 MG 24 hr tablet, Take 2 tablets (1,000 mg total) by mouth daily with supper., Disp: 180 tablet, Rfl: 2   ONETOUCH VERIO test strip, USE TO TEST BLOOD SUGAR ONCE DAILY DX E11.9, Disp: 100 strip, Rfl: 3   pravastatin (PRAVACHOL) 40 MG tablet, Take 1 tablet (40 mg total) by mouth daily., Disp: 90 tablet, Rfl: 3   Semaglutide 7 MG TABS, Take 14 mg by mouth daily., Disp: , Rfl:   Assessment/ Plan: 71 y.o. female   Acute midline low back pain without sciatica - Plan: Ambulatory referral to Physical Therapy, diclofenac (VOLTAREN) 75 MG EC tablet, methocarbamol (ROBAXIN) 500 MG tablet, lidocaine (LIDODERM) 5 %  Ongoing low back pain despite use of oral NSAIDs.  Discontinue Celebrex.  Switch over to diclofenac.  Use with food and plenty of water.  Avoid other NSAIDs.  May use Tylenol if needed.  I hesitate to repeat steroids given known uncontrolled type 2 diabetes.  However, if we need to repeat this on Monday, I will certainly do so.  Lidocaine patch sent, referral to physical therapy placed and Robaxin given.  Advised to hold cyclobenzaprine while using Robaxin to see if this might be a superior medication for her.  Caution sedation.  Red flag signs and symptoms reviewed.  Follow-up as needed  Start time:  1:48pm End time: 1:56pm  Total time spent on patient care (including telephone call/ virtual visit): 8 minutes  [place], [place] [phone]

## 2022-01-11 ENCOUNTER — Encounter: Payer: Self-pay | Admitting: Family Medicine

## 2022-01-11 ENCOUNTER — Telehealth: Payer: Self-pay

## 2022-01-11 NOTE — Telephone Encounter (Signed)
[facility] [place] [facility]  Outcome  Approved today  Your request has been approved  Drug Lidocaine 5% patches Form  [employer] Medicare Electronic PA Form (2017 NCPDP) Original Claim Info 43,700 PRECERT REQ BY MD [phone] REQUIRED MD CALL [phone] REQUIRES PRIOR AUTHORIZATION

## 2022-01-11 NOTE — Telephone Encounter (Signed)
Paul Half MINTER  Key: [id number]  - PA Case ID: [id number]  - Rx #: [id number] Need help? Call us at [phone]  Status  Sent to Plan today  Drug  Lidocaine 5% patches Form [employer] Medicare Electronic PA Form (2017 NCPDP) Original Claim Info 35,789 PRECERT REQ BY MD [phone] REQUIRED MD CALL [phone] REQUIRES PRIOR AUTHORIZATION

## 2022-01-18 ENCOUNTER — Other Ambulatory Visit: Payer: Self-pay | Admitting: Family Medicine

## 2022-01-18 DIAGNOSIS — M545 Low back pain, unspecified: Secondary | ICD-10-CM

## 2022-01-18 NOTE — Telephone Encounter (Signed)
Last office visit 01/08/22 ?Last refill 01/08/22, #15, no refills ?

## 2022-02-16 ENCOUNTER — Telehealth: Payer: Self-pay | Admitting: Family Medicine

## 2022-02-16 NOTE — Telephone Encounter (Signed)
Please call patient to schedule an appt with Almyra Free for DM Ck with new medicine.  ?

## 2022-02-17 NOTE — Telephone Encounter (Signed)
How soon does this appt need to be with Almyra Free? ?

## 2022-02-18 ENCOUNTER — Ambulatory Visit (INDEPENDENT_AMBULATORY_CARE_PROVIDER_SITE_OTHER): Payer: Medicare HMO

## 2022-02-18 ENCOUNTER — Other Ambulatory Visit: Payer: Self-pay | Admitting: Family Medicine

## 2022-02-18 VITALS — Wt 203.0 lb

## 2022-02-18 DIAGNOSIS — Z780 Asymptomatic menopausal state: Secondary | ICD-10-CM

## 2022-02-18 DIAGNOSIS — Z0000 Encounter for general adult medical examination without abnormal findings: Secondary | ICD-10-CM

## 2022-02-18 DIAGNOSIS — E1165 Type 2 diabetes mellitus with hyperglycemia: Secondary | ICD-10-CM

## 2022-02-18 DIAGNOSIS — Z1211 Encounter for screening for malignant neoplasm of colon: Secondary | ICD-10-CM

## 2022-02-18 NOTE — Progress Notes (Signed)
? ?Subjective:  ? Erica Evans is a 71 y.o. female who presents for Medicare Annual (Subsequent) preventive examination. ? ?Virtual Visit via Telephone Note ? ?I connected with  Erica Evans on 02/18/22 at  9:00 AM EDT by telephone and verified that I am speaking with the correct person using two identifiers. ? ?Location: ?Patient: Home ?Provider: [facility] ?Persons participating in the virtual visit: patient/Nurse Health Advisor ?  ?I discussed the limitations, risks, security and privacy concerns of performing an evaluation and management service by telephone and the availability of in person appointments. The patient expressed understanding and agreed to proceed. ? ?Interactive audio and video telecommunications were attempted between this nurse and patient, however failed, due to patient having technical difficulties OR patient did not have access to video capability.  We continued and completed visit with audio only. ? ?Some vital signs may be absent or patient reported.  ? ?Erica Capano Dionne Ano, LPN  ? ?Review of Systems    ? ?Cardiac Risk Factors include: advanced age (>36mn, >>73women);diabetes mellitus;dyslipidemia;hypertension;sedentary lifestyle;obesity (BMI >30kg/m2) ? ?   ?Objective:  ?  ?Today's Vitals  ? 02/18/22 0908  ?Weight: 203 lb (92.1 kg)  ?PainSc: 4   ? ?Body mass index is 31.79 kg/m?. ? ? ?  02/16/2021  ?  9:41 AM 02/14/2020  ?  9:47 AM 05/23/2017  ? 10:06 AM  ?Advanced Directives  ?Does Patient Have a Medical Advance Directive? Yes Yes Yes  ?Type of AParamedicof ACastaicLiving will HPayetteLiving will;Out of facility DNR (pink MOST or yellow form)   ?Copy of [place] Chart? No - copy requested    ? ? ?Current Medications (verified) ?Outpatient Encounter Medications as of 02/18/2022  ?Medication Sig  ? amLODipine (NORVASC) 10 MG tablet Take 1 tablet (10 mg total) by mouth daily. (NEEDS TO BE SEEN BEFORE NEXT REFILL)  ?  aspirin EC 81 MG tablet Take 81 mg by mouth daily. Swallow whole.  ? blood glucose meter kit and supplies Check BS once daily  ? busPIRone (BUSPAR) 15 MG tablet TAKE 1/2 TO 1 TABLET EVERY 8 HOURS FOR NERVES  ? Cholecalciferol (D-3-5) 125 MCG (5000 UT) capsule Take 5,000 Units by mouth daily.  ? diclofenac (VOLTAREN) 75 MG EC tablet Take 1 tablet (75 mg total) by mouth 2 (two) times daily as needed for moderate pain.  ? empagliflozin (JARDIANCE) 25 MG TABS tablet Take 25 mg by mouth daily.  ? glipiZIDE (GLUCOTROL XL) 10 MG 24 hr tablet Take 1 tablet (10 mg total) by mouth daily with breakfast.  ? hydrochlorothiazide (HYDRODIURIL) 25 MG tablet Take 1 tablet (25 mg total) by mouth daily. (NEEDS TO BE SEEN BEFORE NEXT REFILL)  ? lidocaine (LIDODERM) 5 % Place 1 patch onto the skin daily. Remove & Discard patch within 12 hours or as directed by MD  ? metFORMIN (GLUCOPHAGE-XR) 500 MG 24 hr tablet Take 2 tablets (1,000 mg total) by mouth daily with supper.  ? ONETOUCH VERIO test strip USE TO TEST BLOOD SUGAR ONCE DAILY DX E11.9  ? pravastatin (PRAVACHOL) 40 MG tablet Take 1 tablet (40 mg total) by mouth daily.  ? Semaglutide 7 MG TABS Take 14 mg by mouth daily.  ? methocarbamol (ROBAXIN) 500 MG tablet TAKE 1 TABLET (500 MG TOTAL) BY MOUTH 2 (TWO) TIMES DAILY AS NEEDED FOR MUSCLE SPASMS. (Patient not taking: Reported on 02/18/2022)  ? ?No facility-administered encounter medications on file as of  02/18/2022.  ? ? ?Allergies (verified) ?Lipitor [atorvastatin], Morphine and related, and Farxiga [dapagliflozin]  ? ?History: ?Past Medical History:  ?Diagnosis Date  ? Diabetes mellitus without complication ([place])   ? Hyperlipidemia   ? Hypertension   ? Palpitations   ? Vertigo   ? ?History reviewed. No pertinent surgical history. ?Family History  ?Problem Relation Age of Onset  ? Coronary artery disease Mother   ? Diabetes Mother   ? Heart disease Mother   ? Coronary artery disease Father   ? Heart disease Father   ? Diabetes Sister    ? Hypertension Sister   ? Heart disease Sister   ? Diabetes Brother   ? Diabetes Sister   ? Arthritis Sister   ? Hypertension Sister   ? Diabetes Sister   ? Hypertension Sister   ? Hypertension Sister   ? ?Social History  ? ?Socioeconomic History  ? Marital status: Married  ?  Spouse name: Erica Evans  ? Number of children: 2  ? Years of education: Not on file  ? Highest education level: Associate degree: academic program  ?Occupational History  ? Occupation: Control and instrumentation engineer  ?  Comment: retired  ?Tobacco Use  ? Smoking status: Former  ?  Types: Cigarettes  ?  Quit date: 34  ?  Years since quitting: 33.2  ? Smokeless tobacco: Never  ?Vaping Use  ? Vaping Use: Never used  ?Substance and Sexual Activity  ? Alcohol use: No  ? Drug use: No  ? Sexual activity: Not on file  ?Other Topics Concern  ? Not on file  ?Social History Narrative  ? 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 She lives at home with her husband Erica Evans and her grown son. She has 3 children. She enjoys bird watching and walking.   ? ?Social Determinants of Health  ? ?Financial Resource Strain: Low Risk   ? Difficulty of Paying Evans Expenses: Not hard at all  ?Food Insecurity: No Food Insecurity  ? Worried About Charity fundraiser in the Last Year: Never true  ? Ran Out of Food in the Last Year: Never true  ?Transportation Needs: No Transportation Needs  ? Lack of Transportation (Medical): No  ? Lack of Transportation (Non-Medical): No  ?Physical Activity: Insufficiently Active  ? Days of Exercise per Week: 7 days  ? Minutes of Exercise per Session: 20 min  ?Stress: No Stress Concern Present  ? Feeling of Stress : Only a little  ?Social Connections: Socially Integrated  ? Frequency of Communication with Friends and Family: More than three times a week  ? Frequency of Social Gatherings with Friends and Family: More than three times a week  ? Attends Religious Services: More than 4 times per year  ? Active Member of Clubs or Organizations: Yes  ? Attends English as a second language teacher Meetings: More than 4 times per year  ? Marital Status: Married  ? ? ?Tobacco Counseling ?Counseling given: Not Answered ? ? ?Clinical Intake: ? ?Pre-visit preparation completed: Yes ? ?Pain : 0-10 ?Pain Score: 4  ?Pain Type: Chronic pain ?Pain Location: Back ?Pain Orientation: Mid, Lower ?Pain Descriptors / Indicators: Aching, Discomfort ?Pain Onset: More than a month ago ?Pain Frequency: Intermittent ? ?  ? ?BMI - recorded: 31.79 ?Nutritional Status: BMI > 30  Obese ?Nutritional Risks: None ?Diabetes: Yes ?CBG done?: No ?Did pt. bring in CBG monitor from home?: No ? ?How often do you need to have someone help you when you read instructions, pamphlets, or other  written materials from your doctor or pharmacy?: 1 - Never ? ?Diabetic? Nutrition Risk Assessment: ? ?Has the patient had any N/V/D within the last 2 months?  No  ?Does the patient have any non-healing wounds?  No  ?Has the patient had any unintentional weight loss or weight gain?  No  ? ?Diabetes: ? ?Is the patient diabetic?  Yes  ?If diabetic, was a CBG obtained today?  No  ?Did the patient bring in their glucometer from home?  No  ?How often do you monitor your CBG's? Once daily fasting - 150 this am fasting.  ? ?Financial Strains and Diabetes Management: ? ?Are you having any financial strains with the device, your supplies or your medication? No .  ?Does the patient want to be seen by Chronic Care Management for management of their diabetes?  No  ?Would the patient like to be referred to a Nutritionist or for Diabetic Management?  No  ? ?Diabetic Exams: ? ?Diabetic Eye Exam: Completed 07/14/2021.  ? ?Diabetic Foot Exam: Completed 427/2022. Pt has been advised about the importance in completing this exam. Pt is scheduled for diabetic foot exam on next appt.   ? ?Interpreter Needed?: No ? ?Information entered by :: Jabri Blancett, LPN ? ? ?Activities of Daily Evans ? ?  02/18/2022  ?  9:23 AM  ?In your present state of health, do you have any  difficulty performing the following activities:  ?Hearing? 0  ?Vision? 0  ?Difficulty concentrating or making decisions? 0  ?Walking or climbing stairs? 0  ?Dressing or bathing? 0  ?Doing errands, shopping

## 2022-02-18 NOTE — Telephone Encounter (Signed)
She says she will be out of Rybelsus and Jardiance in less than a week, since they were recently increased - Almyra Free has been helping her get these. Wants to know if Almyra Free has time to call her Monday or Tuesday next week. Thanks! ?

## 2022-02-18 NOTE — Patient Instructions (Signed)
[name] , ?Thank you for taking time to come for your Medicare Wellness Visit. I appreciate your ongoing commitment to your health goals. Please review the following plan we discussed and let me know if I can assist you in the future.  ? ?Screening recommendations/referrals: ?Colonoscopy: Cologuard done 10/20/2018 - Repeat every 3 years - I ordered this today ?Mammogram: Done 09/07/2021 - Repeat annually ?Bone Density: Done 02/08/2020 - Repeat every 2 years *get at next office visit ?Recommended yearly ophthalmology/optometry visit for glaucoma screening and checkup ?Recommended yearly dental visit for hygiene and checkup ? ?Vaccinations: ?Influenza vaccine: Done 08/17/2021 -Repeat annually ?Pneumococcal vaccine: Done 10/20/2012 & 10/17/2018   ?Tdap vaccine: Done 07/06/2011 - Repeat in 10 years - Due now *consider getting at next office visit ?Shingles vaccine: Due* This is still recommended if you never had Chicken Pox - Shingrix is 2 doses 2-6 months apart and over 90% effective     ?Covid-19: Done  01/09/2020, 02/06/2020, 10/01/2020, 06/17/2021, & 10/14/2021 ? ?Advanced directives: Please bring a copy of your health care power of attorney and living will to the office to be added to your chart at your convenience.  ? ?Conditions/risks identified: Keep up the great work! Aim for 30 minutes of exercise or brisk walking, 6-8 glasses of water, and 5 servings of fruits and vegetables each day.  ? ?Next appointment: Follow up in one year for your annual wellness visit  ? ? ?Preventive Care 6 Years and Older, Female ?Preventive care refers to lifestyle choices and visits with your health care provider that can promote health and wellness. ?What does preventive care include? ?A yearly physical exam. This is also called an annual well check. ?Dental exams once or twice a year. ?Routine eye exams. Ask your health care provider how often you should have your eyes checked. ?Personal lifestyle choices, including: ?Daily care  of your teeth and gums. ?Regular physical activity. ?Eating a healthy diet. ?Avoiding tobacco and drug use. ?Limiting alcohol use. ?Practicing safe sex. ?Taking low-dose aspirin every day. ?Taking vitamin and mineral supplements as recommended by your health care provider. ?What happens during an annual well check? ?The services and screenings done by your health care provider during your annual well check will depend on your age, overall health, lifestyle risk factors, and family history of disease. ?Counseling  ?Your health care provider may ask you questions about your: ?Alcohol use. ?Tobacco use. ?Drug use. ?Emotional well-being. ?Home and relationship well-being. ?Sexual activity. ?Eating habits. ?History of falls. ?Memory and ability to understand (cognition). ?Work and work Statistician. ?Reproductive health. ?Screening  ?You may have the following tests or measurements: ?Height, weight, and BMI. ?Blood pressure. ?Lipid and cholesterol levels. These may be checked every 5 years, or more frequently if you are over 24 years old. ?Skin check. ?Lung cancer screening. You may have this screening every year starting at age 3 if you have a 30-pack-year history of smoking and currently smoke or have quit within the past 15 years. ?Fecal occult blood test (FOBT) of the stool. You may have this test every year starting at age 107. ?Flexible sigmoidoscopy or colonoscopy. You may have a sigmoidoscopy every 5 years or a colonoscopy every 10 years starting at age 75. ?Hepatitis C blood test. ?Hepatitis B blood test. ?Sexually transmitted disease (STD) testing. ?Diabetes screening. This is done by checking your blood sugar (glucose) after you have not eaten for a while (fasting). You may have this done every 1-3 years. ?Bone density scan. This is  done to screen for osteoporosis. You may have this done starting at age 10. ?Mammogram. This may be done every 1-2 years. Talk to your health care provider about how often you  should have regular mammograms. ?Talk with your health care provider about your test results, treatment options, and if necessary, the need for more tests. ?Vaccines  ?Your health care provider may recommend certain vaccines, such as: ?Influenza vaccine. This is recommended every year. ?Tetanus, diphtheria, and acellular pertussis (Tdap, Td) vaccine. You may need a Td booster every 10 years. ?Zoster vaccine. You may need this after age 33. ?Pneumococcal 13-valent conjugate (PCV13) vaccine. One dose is recommended after age 14. ?Pneumococcal polysaccharide (PPSV23) vaccine. One dose is recommended after age 50. ?Talk to your health care provider about which screenings and vaccines you need and how often you need them. ?This information is not intended to replace advice given to you by your health care provider. Make sure you discuss any questions you have with your health care provider. ?Document Released: 11/28/2015 Document Revised: 07/21/2016 Document Reviewed: 09/02/2015 ?Elsevier Interactive Patient Education ? 2017 [place]. ? ?Fall Prevention in the Home ?Falls can cause injuries. They can happen to people of all ages. There are many things you can do to make your home safe and to help prevent falls. ?What can I do on the outside of my home? ?Regularly fix the edges of walkways and driveways and fix any cracks. ?Remove anything that might make you trip as you walk through a door, such as a raised step or threshold. ?Trim any bushes or trees on the path to your home. ?Use bright outdoor lighting. ?Clear any walking paths of anything that might make someone trip, such as rocks or tools. ?Regularly check to see if handrails are loose or broken. Make sure that both sides of any steps have handrails. ?Any raised decks and porches should have guardrails on the edges. ?Have any leaves, snow, or ice cleared regularly. ?Use sand or salt on walking paths during winter. ?Clean up any spills in your garage right away.  This includes oil or grease spills. ?What can I do in the bathroom? ?Use night lights. ?Install grab bars by the toilet and in the tub and shower. Do not use towel bars as grab bars. ?Use non-skid mats or decals in the tub or shower. ?If you need to sit down in the shower, use a plastic, non-slip stool. ?Keep the floor dry. Clean up any water that spills on the floor as soon as it happens. ?Remove soap buildup in the tub or shower regularly. ?Attach bath mats securely with double-sided non-slip rug tape. ?Do not have throw rugs and other things on the floor that can make you trip. ?What can I do in the bedroom? ?Use night lights. ?Make sure that you have a light by your bed that is easy to reach. ?Do not use any sheets or blankets that are too big for your bed. They should not hang down onto the floor. ?Have a firm chair that has side arms. You can use this for support while you get dressed. ?Do not have throw rugs and other things on the floor that can make you trip. ?What can I do in the kitchen? ?Clean up any spills right away. ?Avoid walking on wet floors. ?Keep items that you use a lot in easy-to-reach places. ?If you need to reach something above you, use a strong step stool that has a grab bar. ?Keep electrical cords out of  the way. ?Do not use floor polish or wax that makes floors slippery. If you must use wax, use non-skid floor wax. ?Do not have throw rugs and other things on the floor that can make you trip. ?What can I do with my stairs? ?Do not leave any items on the stairs. ?Make sure that there are handrails on both sides of the stairs and use them. Fix handrails that are broken or loose. Make sure that handrails are as long as the stairways. ?Check any carpeting to make sure that it is firmly attached to the stairs. Fix any carpet that is loose or worn. ?Avoid having throw rugs at the top or bottom of the stairs. If you do have throw rugs, attach them to the floor with carpet tape. ?Make sure that  you have a light switch at the top of the stairs and the bottom of the stairs. If you do not have them, ask someone to add them for you. ?What else can I do to help prevent falls? ?Wear shoes that: ?Do not

## 2022-02-19 NOTE — Telephone Encounter (Signed)
Please advise on scheduling

## 2022-02-23 ENCOUNTER — Telehealth: Payer: Self-pay | Admitting: Pharmacist

## 2022-02-23 MED ORDER — EMPAGLIFLOZIN 25 MG PO TABS: 25.0000 mg | ORAL_TABLET | Freq: Every day | ORAL | 3 refills | Status: DC

## 2022-02-23 NOTE — Telephone Encounter (Signed)
The soonest I have is 4/25 at 1:30 pm under pharm clinic ?I went ahead and scheduled her--i'll get my tech to see what is going on with her shipments ?It will be 4 weeks before new rybelsus can get here (unsure due to backorders) ?

## 2022-02-23 NOTE — Telephone Encounter (Signed)
Spoke with [facility]ordisk & BI cares regarding pt's medications. ? ?Informed pt that her Rybelsus '14mg'$  is processing for shipping as of today. Pt says she may have about 2 weeks worth left and requested samples if available. Also informed pt her Vania Rea is set to ship 02/24/22 and should arrive in 5-7 business days. ?

## 2022-02-23 NOTE — Telephone Encounter (Signed)
Can you follow up with patient? ?She has not received rybelsus '14mg'$  ?And is almost out of jardiance '25mg'$  ( I just escirbed that one) ? ?Her forms were already submitted for this year ? ?Thank you! ?

## 2022-02-24 NOTE — Telephone Encounter (Signed)
Pt aware.

## 2022-02-25 ENCOUNTER — Telehealth: Payer: Self-pay | Admitting: Family Medicine

## 2022-03-05 ENCOUNTER — Telehealth: Payer: Self-pay | Admitting: Pharmacist

## 2022-03-05 LAB — COLOGUARD: COLOGUARD: NEGATIVE

## 2022-03-05 NOTE — Telephone Encounter (Signed)
CALLED PATIENT TO LET HER KNOW HER RYBELSUS '14MG'$  TABLET PATIENT ASSISTANCE PROGRAM  ?#4 BOTTLES ?

## 2022-03-09 ENCOUNTER — Ambulatory Visit: Payer: Medicare HMO | Admitting: Pharmacist

## 2022-03-10 ENCOUNTER — Telehealth: Payer: Self-pay | Admitting: Family Medicine

## 2022-03-10 NOTE — Telephone Encounter (Signed)
Patient made appt for 6/28 at 9:30 with Dr Lajuana Ripple and wants to make appt for DEXA scan on the same day. Please call back to schedule.  ?

## 2022-04-02 ENCOUNTER — Other Ambulatory Visit: Payer: Self-pay | Admitting: Family Medicine

## 2022-04-02 DIAGNOSIS — E1159 Type 2 diabetes mellitus with other circulatory complications: Secondary | ICD-10-CM

## 2022-04-18 ENCOUNTER — Other Ambulatory Visit: Payer: Self-pay | Admitting: Family Medicine

## 2022-04-18 DIAGNOSIS — E1165 Type 2 diabetes mellitus with hyperglycemia: Secondary | ICD-10-CM

## 2022-05-07 ENCOUNTER — Ambulatory Visit (INDEPENDENT_AMBULATORY_CARE_PROVIDER_SITE_OTHER): Payer: Medicare HMO | Admitting: Pharmacist

## 2022-05-07 DIAGNOSIS — E1169 Type 2 diabetes mellitus with other specified complication: Secondary | ICD-10-CM

## 2022-05-11 ENCOUNTER — Telehealth: Payer: Medicare HMO

## 2022-05-11 ENCOUNTER — Telehealth: Payer: Self-pay | Admitting: Family Medicine

## 2022-05-12 ENCOUNTER — Encounter: Payer: Self-pay | Admitting: Family Medicine

## 2022-05-12 ENCOUNTER — Ambulatory Visit (INDEPENDENT_AMBULATORY_CARE_PROVIDER_SITE_OTHER): Payer: Medicare HMO

## 2022-05-12 ENCOUNTER — Ambulatory Visit: Payer: Medicare HMO | Admitting: Family Medicine

## 2022-05-12 VITALS — BP 136/79 | HR 84 | Temp 98.4°F | Resp 20 | Ht 67.0 in | Wt 193.0 lb

## 2022-05-12 DIAGNOSIS — I152 Hypertension secondary to endocrine disorders: Secondary | ICD-10-CM

## 2022-05-12 DIAGNOSIS — G720 Drug-induced myopathy: Secondary | ICD-10-CM

## 2022-05-12 DIAGNOSIS — E785 Hyperlipidemia, unspecified: Secondary | ICD-10-CM

## 2022-05-12 DIAGNOSIS — Z780 Asymptomatic menopausal state: Secondary | ICD-10-CM | POA: Diagnosis not present

## 2022-05-12 DIAGNOSIS — E119 Type 2 diabetes mellitus without complications: Secondary | ICD-10-CM

## 2022-05-12 DIAGNOSIS — M810 Age-related osteoporosis without current pathological fracture: Secondary | ICD-10-CM

## 2022-05-12 DIAGNOSIS — E1169 Type 2 diabetes mellitus with other specified complication: Secondary | ICD-10-CM

## 2022-05-12 DIAGNOSIS — R7989 Other specified abnormal findings of blood chemistry: Secondary | ICD-10-CM

## 2022-05-12 DIAGNOSIS — Z6379 Other stressful life events affecting family and household: Secondary | ICD-10-CM

## 2022-05-12 DIAGNOSIS — E1159 Type 2 diabetes mellitus with other circulatory complications: Secondary | ICD-10-CM | POA: Diagnosis not present

## 2022-05-12 LAB — BAYER DCA HB A1C WAIVED: HB A1C (BAYER DCA - WAIVED): 6.8 % — ABNORMAL HIGH (ref 4.8–5.6)

## 2022-05-12 NOTE — Patient Instructions (Signed)
Sugar looks great!  A1c 6.8 today.  Keep up the good work!!

## 2022-05-12 NOTE — Progress Notes (Signed)
Subjective: CC:DM PCP: [name], DO WGY:KZLDJ C Gusler Erica Evans is a 71 y.o. female presenting to clinic today for:  1. Type 2 Diabetes with hypertension, hyperlipidemia:  Compliant with Jardiance, glipizide, metformin and Rybelsus.  Also taking Norvasc, hydrochlorothiazide and Pravachol.  Tolerating medications without difficulty.  Last eye exam: UTD Last foot exam: needs Last A1c:  Lab Results  Component Value Date   HGBA1C 7.9 (H) 09/15/2021   Nephropathy screen indicated?: needs Last flu, zoster and/or pneumovax:  Immunization History  Administered Date(s) Administered   Fluad Quad(high Dose 65+) 08/15/2019, 08/29/2020, 08/17/2021   Influenza Inj Mdck Quad Pf 09/01/2017   Influenza Split 09/21/2013   Influenza, High Dose Seasonal PF 08/24/2018   Influenza,inj,Quad PF,6+ Mos 09/19/2014, 09/03/2015, 08/04/2016   Influenza-Unspecified 08/30/2017   Moderna Covid-19 Vaccine Bivalent Booster 12yr & up 10/14/2021   Moderna SARS-COV2 Booster Vaccination 06/17/2021   Moderna Sars-Covid-2 Vaccination 01/09/2020, 02/06/2020, 10/01/2020   Pneumococcal Conjugate-13 10/20/2012, 10/17/2018   Pneumococcal Polysaccharide-23 10/20/2012   Tdap 07/06/2011    ROS: No chest pain, shortness of breath or dizziness reported.   ROS: Per HPI  Allergies  Allergen Reactions   Lipitor [Atorvastatin]    Morphine And Related    Farxiga [Dapagliflozin]     UTI/CONSTIPATION TOLERATES JARDIANCE   Past Medical History:  Diagnosis Date   Diabetes mellitus without complication (HCC)    Hyperlipidemia    Hypertension    Palpitations    Vertigo     Current Outpatient Medications:    amLODipine (NORVASC) 10 MG tablet, Take 1 tablet (10 mg total) by mouth daily., Disp: 90 tablet, Rfl: 0   aspirin EC 81 MG tablet, Take 81 mg by mouth daily. Swallow whole., Disp: , Rfl:    blood glucose meter kit and supplies, Check BS once daily, Disp: 1 each, Rfl: 0   busPIRone (BUSPAR) 15 MG  tablet, TAKE 1/2 TO 1 TABLET EVERY 8 HOURS FOR NERVES, Disp: 270 tablet, Rfl: 3   Cholecalciferol (D-3-5) 125 MCG (5000 UT) capsule, Take 5,000 Units by mouth daily., Disp: , Rfl:    diclofenac (VOLTAREN) 75 MG EC tablet, Take 1 tablet (75 mg total) by mouth 2 (two) times daily as needed for moderate pain., Disp: 30 tablet, Rfl: 0   empagliflozin (JARDIANCE) 25 MG TABS tablet, Take 1 tablet (25 mg total) by mouth daily., Disp: 90 tablet, Rfl: 3   glipiZIDE (GLUCOTROL XL) 10 MG 24 hr tablet, Take 1 tablet (10 mg total) by mouth daily with breakfast. (NEEDS TO BE SEEN BEFORE NEXT REFILL), Disp: 30 tablet, Rfl: 0   hydrochlorothiazide (HYDRODIURIL) 25 MG tablet, Take 1 tablet (25 mg total) by mouth daily., Disp: 90 tablet, Rfl: 0   lidocaine (LIDODERM) 5 %, Place 1 patch onto the skin daily. Remove & Discard patch within 12 hours or as directed by MD, Disp: 30 patch, Rfl: 0   metFORMIN (GLUCOPHAGE-XR) 500 MG 24 hr tablet, Take 2 tablets (1,000 mg total) by mouth daily with supper., Disp: 60 tablet, Rfl: 0   methocarbamol (ROBAXIN) 500 MG tablet, TAKE 1 TABLET (500 MG TOTAL) BY MOUTH 2 (TWO) TIMES DAILY AS NEEDED FOR MUSCLE SPASMS. (Patient not taking: Reported on 02/18/2022), Disp: 15 tablet, Rfl: 0   ONETOUCH VERIO test strip, USE TO TEST BLOOD SUGAR ONCE DAILY DX E11.9, Disp: 100 strip, Rfl: 3   pravastatin (PRAVACHOL) 40 MG tablet, Take 1 tablet (40 mg total) by mouth daily., Disp: 90 tablet, Rfl: 3   Semaglutide 7 MG  TABS, Take 14 mg by mouth daily., Disp: , Rfl:  Social History   Socioeconomic History   Marital status: Married    Spouse name: [name]   Number of children: 2   Years of education: Not on file   Highest education level: Associate degree: academic program  Occupational History   Occupation: Control and instrumentation engineer    Comment: retired  Tobacco Use   Smoking status: Former    Types: Cigarettes    Quit date: [place] since quitting: 33.5   Smokeless tobacco: Never  Vaping Use    Vaping Use: Never used  Substance and Sexual Activity   Alcohol use: No   Drug use: No   Sexual activity: Not on file  Other Topics Concern   Not on file  Social History Narrative   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 She lives at home with her husband [name] and her grown son. She has 3 children. She enjoys bird watching and walking.    Social Determinants of Health   Financial Resource Strain: Low Risk  (02/18/2022)   Overall Financial Resource Strain (CARDIA)    Difficulty of Paying Living Expenses: Not hard at all  Food Insecurity: No Food Insecurity (02/18/2022)   Hunger Vital Sign    Worried About Running Out of Food in the Last Year: Never true    Ran Out of Food in the Last Year: Never true  Transportation Needs: No Transportation Needs (02/18/2022)   PRAPARE - Hydrologist (Medical): No    Lack of Transportation (Non-Medical): No  Physical Activity: Insufficiently Active (02/18/2022)   Exercise Vital Sign    Days of Exercise per Week: 7 days    Minutes of Exercise per Session: 20 min  Stress: No Stress Concern Present (02/18/2022)   [place]    Feeling of Stress : Only a little  Social Connections: Socially Integrated (02/18/2022)   Social Connection and Isolation Panel [NHANES]    Frequency of Communication with Friends and Family: More than three times a week    Frequency of Social Gatherings with Friends and Family: More than three times a week    Attends Religious Services: More than 4 times per year    Active Member of [employer] or Organizations: Yes    Attends Archivist Meetings: More than 4 times per year    Marital Status: Married  Human resources officer Violence: Not At Risk (02/18/2022)   Humiliation, Afraid, Rape, and Kick questionnaire    Fear of Current or Ex-Partner: No    Emotionally Abused: No    Physically Abused: No    Sexually Abused: No   Family History   Problem Relation Age of Onset   Coronary artery disease Mother    Diabetes Mother    Heart disease Mother    Coronary artery disease Father    Heart disease Father    Diabetes Sister    Hypertension Sister    Heart disease Sister    Diabetes Brother    Diabetes Sister    Arthritis Sister    Hypertension Sister    Diabetes Sister    Hypertension Sister    Hypertension Sister     Objective: Office vital signs reviewed. BP 136/79   Pulse 84   Temp 98.4 F (36.9 C) (Oral)   Resp 20   Ht '5\' 7"'  (1.702 m)   Wt 193 lb (87.5 kg)   SpO2  98%   BMI 30.23 kg/m   Physical Examination:  General: Awake, alert, well nourished, No acute distress HEENT: Sclera slightly injected as patient is tearful. Cardio: regular rate and rhythm, S1S2 heard, no murmurs appreciated Pulm: clear to auscultation bilaterally, no wheezes, rhonchi or rales; normal work of breathing on room air Psych: Depressed.  Tearful.     05/12/2022    9:27 AM 02/18/2022    9:18 AM 01/05/2022    8:41 AM  Depression screen PHQ 2/9  Decreased Interest 0 0 0  Down, Depressed, Hopeless 0 1 1  PHQ - 2 Score 0 1 1  Altered sleeping 0 0 0  Tired, decreased energy 0 1 1  Change in appetite 0 1 0  Feeling bad or failure about yourself  0 0 0  Trouble concentrating 0 0 0  Moving slowly or fidgety/restless 0 0 0  Suicidal thoughts 0 0 0  PHQ-9 Score 0 3 2  Difficult doing work/chores  Somewhat difficult Somewhat difficult      05/12/2022    9:27 AM 09/15/2021    8:53 AM 06/12/2021    8:58 AM 07/20/2019   10:11 AM  GAD 7 : Generalized Anxiety Score  Nervous, Anxious, on Edge '1 1 1 ' 0  Control/stop worrying 0 1 1 0  Worry too much - different things 1 0 1 0  Trouble relaxing 0 0 0 0  Restless 0 0 0 0  Easily annoyed or irritable 0 0 0 0  Afraid - awful might happen 0 0 1 0  Total GAD 7 Score '2 2 4 ' 0  Anxiety Difficulty Not difficult at all Not difficult at all Not difficult at all    Assessment/ Plan: 71 y.o. female    Type 2 diabetes mellitus without complication, without long-term current use of insulin (HCC) - Plan: Bayer DCA Hb A1c Waived, Microalbumin / creatinine urine ratio  Hypertension associated with diabetes ([place]) - Plan: CMP14+EGFR  Hyperlipidemia associated with type 2 diabetes mellitus ([place]) - Plan: Lipid panel, TSH  Drug-induced myopathy  Elevated platelet count - Plan: CBC with Differential/Platelet  Age-related osteoporosis without current pathological fracture - Plan: VITAMIN D 25 Hydroxy (Vit-D Deficiency, Fractures)  Stress due to illness of family member  Sugar under excellent control.  No changes.  Check urine microalbumin.  Plan for diabetic foot exam at next visit as much of our exam was dedicated to the emotional distress due to illness of a family member.  Samples of the Jardiance and telephone number for Jardiance renewal provided to the patient today.  Blood pressure well controlled.  No changes.  Check lipid, TSH, CMP  Intolerant to more potent statins in the past but tolerating Pravachol without difficulty  Check CBC given elevations in platelet count noted previously  DEXA updated today.  No orders of the defined types were placed in this encounter.  No orders of the defined types were placed in this encounter.    [name], DO [place] [phone]

## 2022-05-13 LAB — CBC WITH DIFFERENTIAL/PLATELET
Basophils Absolute: 0.1 10*3/uL (ref 0.0–0.2)
Basos: 1 %
EOS (ABSOLUTE): 0.2 10*3/uL (ref 0.0–0.4)
Eos: 2 %
Hematocrit: 42.9 % (ref 34.0–46.6)
Hemoglobin: 13.4 g/dL (ref 11.1–15.9)
Immature Grans (Abs): 0 10*3/uL (ref 0.0–0.1)
Immature Granulocytes: 0 %
Lymphocytes Absolute: 2.8 10*3/uL (ref 0.7–3.1)
Lymphs: 28 %
MCH: 23.3 pg — ABNORMAL LOW (ref 26.6–33.0)
MCHC: 31.2 g/dL — ABNORMAL LOW (ref 31.5–35.7)
MCV: 75 fL — ABNORMAL LOW (ref 79–97)
Monocytes Absolute: 0.8 10*3/uL (ref 0.1–0.9)
Monocytes: 8 %
Neutrophils Absolute: 6.2 10*3/uL (ref 1.4–7.0)
Neutrophils: 61 %
Platelets: 548 10*3/uL — ABNORMAL HIGH (ref 150–450)
RBC: 5.75 x10E6/uL — ABNORMAL HIGH (ref 3.77–5.28)
RDW: 15.4 % (ref 11.7–15.4)
WBC: 10 10*3/uL (ref 3.4–10.8)

## 2022-05-13 LAB — CMP14+EGFR
ALT: 13 IU/L (ref 0–32)
AST: 17 IU/L (ref 0–40)
Albumin/Globulin Ratio: 1.5 (ref 1.2–2.2)
Albumin: 4.4 g/dL (ref 3.8–4.8)
Alkaline Phosphatase: 91 IU/L (ref 44–121)
BUN/Creatinine Ratio: 15 (ref 12–28)
BUN: 14 mg/dL (ref 8–27)
Bilirubin Total: 0.3 mg/dL (ref 0.0–1.2)
CO2: 24 mmol/L (ref 20–29)
Calcium: 9.8 mg/dL (ref 8.7–10.3)
Chloride: 97 mmol/L (ref 96–106)
Creatinine, Ser: 0.94 mg/dL (ref 0.57–1.00)
Globulin, Total: 2.9 g/dL (ref 1.5–4.5)
Glucose: 135 mg/dL — ABNORMAL HIGH (ref 70–99)
Potassium: 5.3 mmol/L — ABNORMAL HIGH (ref 3.5–5.2)
Sodium: 137 mmol/L (ref 134–144)
Total Protein: 7.3 g/dL (ref 6.0–8.5)
eGFR: 66 mL/min/{1.73_m2} (ref >59)

## 2022-05-13 LAB — LIPID PANEL
Chol/HDL Ratio: 3 ratio (ref 0.0–4.4)
Cholesterol, Total: 158 mg/dL (ref 100–199)
HDL: 53 mg/dL (ref >39)
LDL Chol Calc (NIH): 81 mg/dL (ref 0–99)
Triglycerides: 135 mg/dL (ref 0–149)
VLDL Cholesterol Cal: 24 mg/dL (ref 5–40)

## 2022-05-13 LAB — MICROALBUMIN / CREATININE URINE RATIO
Creatinine, Urine: 89.2 mg/dL
Microalb/Creat Ratio: 4 mg/g creat (ref 0–29)
Microalbumin, Urine: 3.9 ug/mL

## 2022-05-13 LAB — TSH: TSH: 3.67 u[IU]/mL (ref 0.450–4.500)

## 2022-05-13 LAB — VITAMIN D 25 HYDROXY (VIT D DEFICIENCY, FRACTURES): Vit D, 25-Hydroxy: 75.8 ng/mL (ref 30.0–100.0)

## 2022-05-14 DIAGNOSIS — E785 Hyperlipidemia, unspecified: Secondary | ICD-10-CM

## 2022-05-14 DIAGNOSIS — E1169 Type 2 diabetes mellitus with other specified complication: Secondary | ICD-10-CM

## 2022-05-14 NOTE — Progress Notes (Signed)
Chronic Care Management Pharmacy Note  05/07/2022 Name:  Erica Evans MRN:  [id number] DOB:  Sep 20, 1951  Summary:  Diabetes: CONTROLLED-A1C 8.4-->7.9-->6.8% (IMPROVING) current treatment: RYBELSUS 14MG DAILY, JARDIANCE 25MG, METFORMIN, GLIPIZIDE; GFR 64 INTOLERANCE REPORTED TO FARXIGA, BUT TOLERATING JARDIANCE WELL PLAN TO D/C GLIPIZIDE CONTINUE RYBELSUS 14MG DAILY SLOWLY TITRATED RYBELSUS DUE TO GI SIDE EFFECTS Denies personal and family history of Medullary thyroid cancer (MTC) SUBMITTED DOSE CHANGE TO NOVO [place] PAP CONTINUE METFORMIN XR 1G CONTINUE JARDIANCE 25MG DAILY  PATIENT APPROVED FOR BI CARES PATIENT ASSISTANCE UNTIL 11/14/22 Current glucose readings: fasting glucose: <160, as low as 124, post prandial glucose: <200 Denies hypoglycemic/hyperglycemic symptoms Discussed meal planning options and Plate method for healthy eating Avoid sugary drinks and desserts Incorporate balanced protein, non starchy veggies, 1 serving of carbohydrate with each meal Increase water intake Increase physical activity as able  Current exercise: N/A Educated on DIABETES, MEDICATIONS PATIENT APPROVED FOR BI CARES (JARDIANCE) AND NOVO [place] (RYBELSUS) PATIENT ASSISTANCE PROGRAMS UNTIL 11/14/22  Patient Goals/Self-Care Activities Over the next 90 days, patient will:  - take medications as prescribed collaborate with provider on medication access solutions  Follow Up Plan: Telephone follow up appointment with care management team member scheduled for: 2MONTHS  Subjective: Erica Evans is an 71 y.o. year old female who is a primary patient of [name], DO.  The CCM team was consulted for assistance with disease management and care coordination needs.    Engaged with patient by telephone for follow up visit in response to provider referral for pharmacy case management and/or care coordination services.   Consent to Services:  The patient was given  information about Chronic Care Management services, agreed to services, and gave verbal consent prior to initiation of services.  Please see initial visit note for detailed documentation.   Patient Care Team: [name], DO as PCP - General (Family Medicine) Sandford Craze, MD as Referring Physician (Dermatology) Leticia Clas, OD (Optometry) Lavera Guise, [facility] as Pharmacist (Family Medicine) Steffanie Rainwater, DPM as Consulting Physician (Podiatry)  Objective:  Lab Results  Component Value Date   CREATININE 0.94 05/12/2022   CREATININE 0.97 03/11/2021   CREATININE 0.93 02/08/2020    Lab Results  Component Value Date   HGBA1C 6.8 (H) 05/12/2022   Last diabetic Eye exam:  Lab Results  Component Value Date/Time   HMDIABEYEEXA No Retinopathy 07/14/2021 12:00 AM    Last diabetic Foot exam: No results found for: "HMDIABFOOTEX"      Component Value Date/Time   CHOL 158 05/12/2022 0912   CHOL 119 04/17/2013 1110   TRIG 135 05/12/2022 0912   TRIG 178 (H) 09/19/2014 0918   TRIG 148 04/17/2013 1110   HDL 53 05/12/2022 0912   HDL 50 09/19/2014 0918   HDL 49 04/17/2013 1110   CHOLHDL 3.0 05/12/2022 0912   LDLCALC 81 05/12/2022 0912   LDLCALC 99 06/14/2014 0859   LDLCALC 40 04/17/2013 1110       Latest Ref Rng & Units 05/12/2022    9:12 AM 03/11/2021    9:55 AM 02/08/2020    8:35 AM  Hepatic Function  Total Protein 6.0 - 8.5 g/dL 7.3  7.9  6.8   Albumin 3.8 - 4.8 g/dL 4.4  4.9  4.2   AST 0 - 40 IU/L _0 ALT 0 - 32 IU/L _1 Alk Phosphatase 44 - 121 IU/L 91  74  64   Total Bilirubin 0.0 - 1.2 mg/dL 0.3  0.2  <0.2     Lab Results  Component Value Date/Time   TSH 3.670 05/12/2022 09:12 AM   TSH 3.930 03/11/2021 09:55 AM       Latest Ref Rng & Units 05/12/2022    9:12 AM 12/27/2017   10:27 AM 06/26/2015   10:56 AM  CBC  WBC 3.4 - 10.8 x10E3/uL 10.0  8.2  7.8   Hemoglobin 11.1 - 15.9 g/dL 13.4  12.9  13.7   Hematocrit 34.0 - 46.6 % 42.9   40.1  40.8   Platelets 150 - 450 x10E3/uL 548  441  437     Lab Results  Component Value Date/Time   VD25OH 75.8 05/12/2022 09:12 AM   VD25OH 60.7 03/11/2021 09:55 AM    Clinical ASCVD: No  The 10-year ASCVD risk score (Arnett DK, et al., 2019) is: 21.7%   Values used to calculate the score:     Age: 71 years     Sex: Female     Is Non-Hispanic African American: No     Diabetic: Yes     Tobacco smoker: No     Systolic Blood Pressure: 476 mmHg     Is BP treated: Yes     HDL Cholesterol: 53 mg/dL     Total Cholesterol: 158 mg/dL    Other: (CHADS2VASc if Afib, PHQ9 if depression, MMRC or CAT for COPD, ACT, DEXA)  Social History   Tobacco Use  Smoking Status Former   Types: Cigarettes   Quit date: 1990   Years since quitting: 33.5  Smokeless Tobacco Never   BP Readings from Last 3 Encounters:  05/12/22 136/79  01/05/22 128/69  09/15/21 (!) 141/77   Pulse Readings from Last 3 Encounters:  05/12/22 84  01/05/22 85  09/15/21 78   Wt Readings from Last 3 Encounters:  05/12/22 193 lb (87.5 kg)  02/18/22 203 lb (92.1 kg)  01/05/22 203 lb (92.1 kg)    Assessment: Review of patient past medical history, allergies, medications, health status, including review of consultants reports, laboratory and other test data, was performed as part of comprehensive evaluation and provision of chronic care management services.   SDOH:  (Social Determinants of Health) assessments and interventions performed:    CCM Care Plan  Allergies  Allergen Reactions   Lipitor [Atorvastatin]    Morphine And Related    Farxiga [Dapagliflozin]     UTI/CONSTIPATION TOLERATES JARDIANCE    Medications Reviewed Today     Reviewed by Michaela Corner, LPN (Licensed Practical Nurse) on 05/12/22 at [phone]  Med List Status: <None>   Medication Order Taking? Sig Documenting Provider Last Dose Status Informant  amLODipine (NORVASC) 10 MG tablet [id number] Yes Take 1 tablet (10 mg total) by mouth  daily. Ronnie Doss M, DO Taking Active   aspirin EC 81 MG tablet [id number] Yes Take 81 mg by mouth daily. Swallow whole. [provider] Taking Active   blood glucose meter kit and supplies [id number] Yes Check BS once daily Timmothy Euler, MD Taking Active   busPIRone (BUSPAR) 15 MG tablet [id number] Yes TAKE 1/2 TO 1 TABLET EVERY 8 HOURS FOR NERVES [name], DO Taking Active            Med Note (HOPKINS, AMY E   Thu Feb 18, 2022  9:12 AM) Dewaine Conger 1/2 pill at night  Cholecalciferol (D-3-5) 125 MCG (5000 UT) capsule [id number] Yes Take 5,000 Units by  mouth daily. [provider] Taking Active   diclofenac (VOLTAREN) 75 MG EC tablet [id number] Yes Take 1 tablet (75 mg total) by mouth 2 (two) times daily as needed for moderate pain. Ronnie Doss M, DO Taking Active   empagliflozin (JARDIANCE) 25 MG TABS tablet [id number] Yes Take 1 tablet (25 mg total) by mouth daily. [name], DO Taking Active            Med Note Blanca Friend, Royce Macadamia   Fri Mar 05, 2022 12:16 PM) VIA BI CARES PATIENT ASSISTANCE PROGRAM   glipiZIDE (GLUCOTROL XL) 10 MG 24 hr tablet [id number] Yes Take 1 tablet (10 mg total) by mouth daily with breakfast. (NEEDS TO BE SEEN BEFORE NEXT REFILL) Ronnie Doss M, DO Taking Active   hydrochlorothiazide (HYDRODIURIL) 25 MG tablet [id number] Yes Take 1 tablet (25 mg total) by mouth daily. Ronnie Doss M, DO Taking Active   lidocaine (LIDODERM) 5 % [id number] Yes Place 1 patch onto the skin daily. Remove & Discard patch within 12 hours or as directed by MD [name], DO Taking Active   metFORMIN (GLUCOPHAGE-XR) 500 MG 24 hr tablet [id number] Yes Take 2 tablets (1,000 mg total) by mouth daily with supper. Ronnie Doss M, DO Taking Active   methocarbamol (ROBAXIN) 500 MG tablet [id number] No TAKE 1 TABLET (500 MG TOTAL) BY MOUTH 2 (TWO) TIMES DAILY AS NEEDED FOR MUSCLE SPASMS.  Patient not taking: Reported on 02/18/2022   [name], DO Not Taking Active   [facility] VERIO test strip [id number] Yes USE TO TEST BLOOD SUGAR ONCE DAILY DX E11.9 Ronnie Doss M, DO Taking Active   pravastatin (PRAVACHOL) 40 MG tablet [id number] Yes Take 1 tablet (40 mg total) by mouth daily. [name], DO Taking Active   Semaglutide 7 MG TABS [id number] Yes Take 14 mg by mouth daily. [provider] Taking Active            Med Note Parthenia Ames Jul 09, 2021 11:54 AM) VIA NOVO [place] PATIENT ASSISTANCE            Patient Active Problem List   Diagnosis Date Noted   Hyperlipidemia associated with type 2 diabetes mellitus ([place]) 10/17/2018   Osteoporosis 12/27/2017   Vitamin D deficiency 06/27/2015   BMI 34.0-34.9,adult 06/14/2014   GAD (generalized anxiety disorder) 06/14/2014   Hypertension associated with diabetes ([place]) 04/17/2013   Diabetes ([place]) 04/17/2013    Immunization History  Administered Date(s) Administered   Fluad Quad(high Dose 65+) 08/15/2019, 08/29/2020, 08/17/2021   Influenza Inj Mdck Quad Pf 09/01/2017   Influenza Split 09/21/2013   Influenza, High Dose Seasonal PF 08/24/2018   Influenza,inj,Quad PF,6+ Mos 09/19/2014, 09/03/2015, 08/04/2016   Influenza-Unspecified 08/30/2017   Moderna Covid-19 Vaccine Bivalent Booster 34yr & up 10/14/2021   Moderna SARS-COV2 Booster Vaccination 06/17/2021   Moderna Sars-Covid-2 Vaccination 01/09/2020, 02/06/2020, 10/01/2020   Pneumococcal Conjugate-13 10/20/2012, 10/17/2018   Pneumococcal Polysaccharide-23 10/20/2012   Tdap 07/06/2011    Conditions to be addressed/monitored: DMII  Care Plan : PHARMD MEDICATION MANAGEMENT  Updates made by PLavera Guise RFranklintonsince 05/14/2022 12:00 AM     Problem: DISEASE PROGRESSION PREVENTION      Long-Range Goal: T2DM   This Visit's Progress: On track  Recent Progress: Not on track  Priority: High  Note:   Current Barriers:  Unable to independently afford treatment regimen Unable to  maintain control of T2DM  Pharmacist Clinical Goal(s):  Over the next 90  days, patient will verbalize ability to afford treatment regimen maintain control of T2DM as evidenced by IMPROVED GLYCEMIC CONTROL  through collaboration with PharmD and provider.    Interventions: 1:1 collaboration with [name], DO regarding development and update of comprehensive plan of care as evidenced by provider attestation and co-signature Inter-disciplinary care team collaboration (see longitudinal plan of care) Comprehensive medication review performed; medication list updated in electronic medical record  Diabetes: CONTROLLED-A1C 8.4-->7.9-->6.8% (IMPROVING) current treatment: RYBELSUS 14MG DAILY, JARDIANCE 25MG, METFORMIN, GLIPIZIDE; GFR 64 INTOLERANCE REPORTED TO FARXIGA, BUT TOLERATING [place] TO D/C GLIPIZIDE CONTINUE RYBELSUS 14MG DAILY SLOWLY TITRATED RYBELSUS DUE TO GI SIDE EFFECTS Denies personal and family history of Medullary thyroid cancer (MTC) SUBMITTED DOSE CHANGE TO NOVO [place] PAP CONTINUE METFORMIN XR 1G CONTINUE JARDIANCE 25MG DAILY  PATIENT APPROVED FOR BI CARES PATIENT ASSISTANCE UNTIL 11/14/22 Current glucose readings: fasting glucose: <160, as low as 124, post prandial glucose: <200 Denies hypoglycemic/hyperglycemic symptoms Discussed meal planning options and Plate method for healthy eating Avoid sugary drinks and desserts Incorporate balanced protein, non starchy veggies, 1 serving of carbohydrate with each meal Increase water intake Increase physical activity as able  Current exercise: N/A Educated on DIABETES, MEDICATIONS PATIENT APPROVED FOR BI CARES (JARDIANCE) AND NOVO [place] ([place]) PATIENT ASSISTANCE PROGRAMS UNTIL 11/14/22  Patient Goals/Self-Care Activities Over the next 90 days, patient will:  - take medications as prescribed collaborate with provider on medication access solutions  Follow Up Plan: Telephone follow up appointment  with care management team member scheduled for: 2MONTHS     Medication Assistance:   JARDIANCE obtained through [place] medication assistance program.  Enrollment ends 11/14/22 RYBELSUS obtained through [place] medication assistance program.  Enrollment ends 11/14/22 Follow Up:  Patient agrees to Care Plan and Follow-up.  Plan: Telephone follow up appointment with care management team member scheduled for:  07/2022     Regina Eck, PharmD, BCPS Clinical Pharmacist, [place]  II Phone [phone]

## 2022-05-14 NOTE — Patient Instructions (Addendum)
Visit Information  Following are the goals we discussed today:  Current Barriers:  Unable to independently afford treatment regimen Unable to maintain control of T2DM  Pharmacist Clinical Goal(s):  Over the next 90 days, patient will verbalize ability to afford treatment regimen maintain control of T2DM as evidenced by IMPROVED GLYCEMIC CONTROL  through collaboration with PharmD and provider.    Interventions: 1:1 collaboration with [name], DO regarding development and update of comprehensive plan of care as evidenced by provider attestation and co-signature Inter-disciplinary care team collaboration (see longitudinal plan of care) Comprehensive medication review performed; medication list updated in electronic medical record  Diabetes: CONTROLLED-A1C 8.4-->7.9-->6.8% (IMPROVING) current treatment: RYBELSUS '14MG'$  DAILY, JARDIANCE '25MG'$ , METFORMIN, GLIPIZIDE; GFR 64 INTOLERANCE REPORTED TO FARXIGA, BUT TOLERATING [place] TO D/C GLIPIZIDE CONTINUE RYBELSUS '14MG'$  DAILY SLOWLY TITRATED RYBELSUS DUE TO GI SIDE EFFECTS Denies personal and family history of Medullary thyroid cancer (MTC) SUBMITTED DOSE CHANGE TO NOVO [place] PAP CONTINUE METFORMIN XR 1G CONTINUE JARDIANCE '25MG'$  DAILY  PATIENT APPROVED FOR BI CARES PATIENT ASSISTANCE UNTIL 11/14/22 Current glucose readings: fasting glucose: <160, as low as 124, post prandial glucose: <200 Denies hypoglycemic/hyperglycemic symptoms Discussed meal planning options and Plate method for healthy eating Avoid sugary drinks and desserts Incorporate balanced protein, non starchy veggies, 1 serving of carbohydrate with each meal Increase water intake Increase physical activity as able  Current exercise: N/A Educated on DIABETES, MEDICATIONS PATIENT APPROVED FOR BI CARES (JARDIANCE) AND NOVO [place] ([place]) PATIENT ASSISTANCE PROGRAMS UNTIL 11/14/22  Patient Goals/Self-Care Activities Over the next 90 days, patient will:   - take medications as prescribed collaborate with provider on medication access solutions  Follow Up Plan: Telephone follow up appointment with care management team member scheduled for: 2MONTHS  Plan: Telephone follow up appointment with care management team member scheduled for:  07/2022  Signature Regina Eck, PharmD, BCPS Clinical Pharmacist, [place]  II Phone [phone]   Please call the care guide team at [phone] if you need to cancel or reschedule your appointment.   The patient verbalized understanding of instructions, educational materials, and care plan provided today and DECLINED offer to receive copy of patient instructions, educational materials, and care plan.

## 2022-05-15 ENCOUNTER — Other Ambulatory Visit: Payer: Self-pay | Admitting: Family Medicine

## 2022-05-15 DIAGNOSIS — E1165 Type 2 diabetes mellitus with hyperglycemia: Secondary | ICD-10-CM

## 2022-06-05 ENCOUNTER — Other Ambulatory Visit: Payer: Self-pay | Admitting: Family Medicine

## 2022-06-12 ENCOUNTER — Other Ambulatory Visit: Payer: Self-pay | Admitting: Family Medicine

## 2022-06-12 DIAGNOSIS — E1165 Type 2 diabetes mellitus with hyperglycemia: Secondary | ICD-10-CM

## 2022-06-13 ENCOUNTER — Other Ambulatory Visit: Payer: Self-pay | Admitting: Family Medicine

## 2022-06-13 DIAGNOSIS — E1169 Type 2 diabetes mellitus with other specified complication: Secondary | ICD-10-CM

## 2022-06-14 MED ORDER — PRAVASTATIN SODIUM 40 MG PO TABS: 40.0000 mg | ORAL_TABLET | Freq: Every day | ORAL | 1 refills | Status: DC

## 2022-06-14 NOTE — Telephone Encounter (Signed)
Refill failed. resent °

## 2022-06-14 NOTE — Addendum Note (Signed)
Addended by: Antonietta Barcelona D on: 06/14/2022 10:06 AM   Modules accepted: Orders

## 2022-07-07 ENCOUNTER — Telehealth: Payer: Self-pay

## 2022-07-07 NOTE — Telephone Encounter (Signed)
Patient assistance received- rybelsus  Patient aware. Medication placed up front

## 2022-07-11 ENCOUNTER — Other Ambulatory Visit: Payer: Self-pay | Admitting: Family Medicine

## 2022-07-11 DIAGNOSIS — I152 Hypertension secondary to endocrine disorders: Secondary | ICD-10-CM

## 2022-08-10 ENCOUNTER — Ambulatory Visit (INDEPENDENT_AMBULATORY_CARE_PROVIDER_SITE_OTHER): Payer: Medicare HMO | Admitting: Pharmacist

## 2022-08-10 DIAGNOSIS — E119 Type 2 diabetes mellitus without complications: Secondary | ICD-10-CM

## 2022-08-10 DIAGNOSIS — E1169 Type 2 diabetes mellitus with other specified complication: Secondary | ICD-10-CM

## 2022-08-10 DIAGNOSIS — G720 Drug-induced myopathy: Secondary | ICD-10-CM

## 2022-08-10 MED ORDER — EMPAGLIFLOZIN 25 MG PO TABS: 25.0000 mg | ORAL_TABLET | Freq: Every day | ORAL | 3 refills | Status: DC

## 2022-08-10 NOTE — Progress Notes (Signed)
Chronic Care Management Pharmacy Note  08/10/2022 Name:  Erica Evans MRN:  [id number] DOB:  Jun 16, 1951  Summary:  Diabetes: CONTROLLED-A1C 7.9-->6.8% (IMPROVING) current treatment: RYBELSUS 14MG DAILY, JARDIANCE 25MG, METFORMIN, GLIPIZIDE; GFR 64 INTOLERANCE REPORTED TO FARXIGA, BUT TOLERATING JARDIANCE WELL PLAN TO D/C GLIPIZIDE CONTINUE RYBELSUS 14MG DAILY SLOWLY TITRATED RYBELSUS DUE TO GI SIDE EFFECTS Denies personal and family history of Medullary thyroid cancer (MTC) SUBMITTED DOSE CHANGE TO NOVO [place] PAP CONTINUE METFORMIN XR 1G AT [place] ONLY CONTINUE JARDIANCE 25MG DAILY  PATIENT APPROVED FOR BI CARES PATIENT ASSISTANCE UNTIL 11/14/22 Current glucose readings: fasting glucose: <160, as low as 124, post prandial glucose: <200 Denies hypoglycemic/hyperglycemic symptoms Discussed meal planning options and Plate method for healthy eating Avoid sugary drinks and desserts Incorporate balanced protein, non starchy veggies, 1 serving of carbohydrate with each meal Increase water intake Increase physical activity as able  Current exercise: N/A Educated on DIABETES, MEDICATIONS PATIENT APPROVED FOR BI CARES (JARDIANCE) AND NOVO [place] (RYBELSUS) PATIENT ASSISTANCE PROGRAMS UNTIL 11/14/22  Patient Goals/Self-Care Activities Over the next 90 days, patient will:  - take medications as prescribed collaborate with provider on medication access solutions  Subjective: Erica Evans is an 71 y.o. year old female who is a primary patient of [name], DO.  The CCM team was consulted for assistance with disease management and care coordination needs.    Engaged with patient by telephone for follow up visit in response to provider referral for pharmacy case management and/or care coordination services.   Consent to Services:  The patient was given information about Chronic Care Management services, agreed to services, and gave verbal consent prior to  initiation of services.  Please see initial visit note for detailed documentation.    Objective:  Lab Results  Component Value Date   CREATININE 0.94 05/12/2022   CREATININE 0.97 03/11/2021   CREATININE 0.93 02/08/2020    Lab Results  Component Value Date   HGBA1C 6.8 (H) 05/12/2022   Last diabetic Eye exam:  Lab Results  Component Value Date/Time   HMDIABEYEEXA No Retinopathy 07/14/2021 12:00 AM       Component Value Date/Time   CHOL 158 05/12/2022 0912   CHOL 119 04/17/2013 1110   TRIG 135 05/12/2022 0912   TRIG 178 (H) 09/19/2014 0918   TRIG 148 04/17/2013 1110   HDL 53 05/12/2022 0912   HDL 50 09/19/2014 0918   HDL 49 04/17/2013 1110   CHOLHDL 3.0 05/12/2022 0912   LDLCALC 81 05/12/2022 0912   LDLCALC 99 06/14/2014 0859   LDLCALC 40 04/17/2013 1110       Latest Ref Rng & Units 05/12/2022    9:12 AM 03/11/2021    9:55 AM 02/08/2020    8:35 AM  Hepatic Function  Total Protein 6.0 - 8.5 g/dL 7.3  7.9  6.8   Albumin 3.8 - 4.8 g/dL 4.4  4.9  4.2   AST 0 - 40 IU/L '17  20  18   ' ALT 0 - 32 IU/L '13  19  11   ' Alk Phosphatase 44 - 121 IU/L 91  74  64   Total Bilirubin 0.0 - 1.2 mg/dL 0.3  0.2  <0.2     Lab Results  Component Value Date/Time   TSH 3.670 05/12/2022 09:12 AM   TSH 3.930 03/11/2021 09:55 AM       Latest Ref Rng & Units 05/12/2022    9:12 AM 12/27/2017   10:27 AM 06/26/2015  10:56 AM  CBC  WBC 3.4 - 10.8 x10E3/uL 10.0  8.2  7.8   Hemoglobin 11.1 - 15.9 g/dL 13.4  12.9  13.7   Hematocrit 34.0 - 46.6 % 42.9  40.1  40.8   Platelets 150 - 450 x10E3/uL 548  441  437     Lab Results  Component Value Date/Time   VD25OH 75.8 05/12/2022 09:12 AM   VD25OH 60.7 03/11/2021 09:55 AM    Clinical ASCVD: No  The 10-year ASCVD risk score (Arnett DK, et al., 2019) is: 21.7%   Values used to calculate the score:     Age: 4 years     Sex: Female     Is Non-Hispanic African American: No     Diabetic: Yes     Tobacco smoker: No     Systolic Blood  Pressure: 136 mmHg     Is BP treated: Yes     HDL Cholesterol: 53 mg/dL     Total Cholesterol: 158 mg/dL    Other: (CHADS2VASc if Afib, PHQ9 if depression, MMRC or CAT for COPD, ACT, DEXA)  Social History   Tobacco Use  Smoking Status Former   Types: Cigarettes   Quit date: 1990   Years since quitting: 33.7  Smokeless Tobacco Never   BP Readings from Last 3 Encounters:  05/12/22 136/79  01/05/22 128/69  09/15/21 (!) 141/77   Pulse Readings from Last 3 Encounters:  05/12/22 84  01/05/22 85  09/15/21 78   Wt Readings from Last 3 Encounters:  05/12/22 193 lb (87.5 kg)  02/18/22 203 lb (92.1 kg)  01/05/22 203 lb (92.1 kg)    Assessment: Review of patient past medical history, allergies, medications, health status, including review of consultants reports, laboratory and other test data, was performed as part of comprehensive evaluation and provision of chronic care management services.   SDOH:  (Social Determinants of Health) assessments and interventions performed:  SDOH Interventions    Flowsheet Row Clinical Support from 02/18/2022 in [place] Visit from 01/05/2022 in [facility] Interventions    Food Insecurity Interventions Intervention Not Indicated --  Housing Interventions Intervention Not Indicated --  Transportation Interventions Intervention Not Indicated --  Depression Interventions/Treatment  PHQ2-9 Score <4 Follow-up Not Indicated Currently on Treatment, PHQ2-9 Score <4 Follow-up Not Indicated  Financial Strain Interventions Intervention Not Indicated --  Physical Activity Interventions Intervention Not Indicated --  Stress Interventions Intervention Not Indicated --  Social Connections Interventions Intervention Not Indicated --       CCM Care Plan  Allergies  Allergen Reactions   Lipitor [Atorvastatin]    Morphine And Related    Farxiga [Dapagliflozin]     UTI/CONSTIPATION TOLERATES  JARDIANCE    Medications Reviewed Today     Reviewed by Lavera Guise, [facility] (Pharmacist) on 08/10/22 at [place] Status: <None>   Medication Order Taking? Sig Documenting Provider Last Dose Status Informant  amLODipine (NORVASC) 10 MG tablet [id number]  TAKE 1 TABLET BY MOUTH EVERY DAY Ronnie Doss M, DO  Active   aspirin EC 81 MG tablet [id number] No Take 81 mg by mouth daily. Swallow whole. [provider] Taking Active   blood glucose meter kit and supplies [id number] No Check BS once daily Timmothy Euler, MD Taking Active   busPIRone (BUSPAR) 15 MG tablet [id number] No TAKE 1/2 TO 1 TABLET EVERY 8 HOURS FOR NERVES [name], DO Taking Active  Med Note (HOPKINS, AMY E   Thu Feb 18, 2022  9:12 AM) Dewaine Conger 1/2 pill at night  Cholecalciferol (D-3-5) 125 MCG (5000 UT) capsule [id number] No Take 5,000 Units by mouth daily. [provider] Taking Active   diclofenac (VOLTAREN) 75 MG EC tablet [id number] No Take 1 tablet (75 mg total) by mouth 2 (two) times daily as needed for moderate pain. Ronnie Doss M, DO Taking Active   empagliflozin (JARDIANCE) 25 MG TABS tablet [id number] No Take 1 tablet (25 mg total) by mouth daily. [name], DO Taking Active            Med Note Blanca Friend, Royce Macadamia   Fri Mar 05, 2022 12:16 PM) VIA BI CARES PATIENT ASSISTANCE PROGRAM   glipiZIDE (GLUCOTROL XL) 10 MG 24 hr tablet [id number]  Take 1 tablet (10 mg total) by mouth daily with breakfast. Ronnie Doss M, DO  Active   hydrochlorothiazide (HYDRODIURIL) 25 MG tablet [id number]  TAKE 1 TABLET (25 MG TOTAL) BY MOUTH DAILY. Ronnie Doss M, DO  Active   lidocaine (LIDODERM) 5 % [id number] No Place 1 patch onto the skin daily. Remove & Discard patch within 12 hours or as directed by MD [name], DO Taking Active   metFORMIN (GLUCOPHAGE-XR) 500 MG 24 hr tablet [id number]  TAKE 2 TABLETS (1,000 MG TOTAL) BY MOUTH DAILY WITH SUPPER Ronnie Doss  M, DO  Active   methocarbamol (ROBAXIN) 500 MG tablet [id number] No TAKE 1 TABLET (500 MG TOTAL) BY MOUTH 2 (TWO) TIMES DAILY AS NEEDED FOR MUSCLE SPASMS.  Patient not taking: Reported on 02/18/2022   [name], DO Not Taking Active   [facility] VERIO test strip [id number]  USE TO TEST BLOOD SUGAR ONCE DAILY DX E11.9 Ronnie Doss M, DO  Active   pravastatin (PRAVACHOL) 40 MG tablet [id number]  Take 1 tablet (40 mg total) by mouth daily. [name], DO  Active   Semaglutide 7 MG TABS [id number] No Take 14 mg by mouth daily. [provider] Taking Active            Med Note Parthenia Ames Jul 09, 2021 11:54 AM) VIA NOVO [place] PATIENT ASSISTANCE            Patient Active Problem List   Diagnosis Date Noted   Hyperlipidemia associated with type 2 diabetes mellitus ([place]) 10/17/2018   Osteoporosis 12/27/2017   Vitamin D deficiency 06/27/2015   BMI 34.0-34.9,adult 06/14/2014   GAD (generalized anxiety disorder) 06/14/2014   Hypertension associated with diabetes ([place]) 04/17/2013   Diabetes ([place] 04/17/2013    Immunization History  Administered Date(s) Administered   Fluad Quad(high Dose 65+) 08/15/2019, 08/29/2020, 08/17/2021   Influenza Inj Mdck Quad Pf 09/01/2017   Influenza Split 09/21/2013   Influenza, High Dose Seasonal PF 08/24/2018   Influenza,inj,Quad PF,6+ Mos 09/19/2014, 09/03/2015, 08/04/2016   Influenza-Unspecified 08/30/2017   Moderna Covid-19 Vaccine Bivalent Booster 42yr & up 10/14/2021   Moderna SARS-COV2 Booster Vaccination 06/17/2021   Moderna Sars-Covid-2 Vaccination 01/09/2020, 02/06/2020, 10/01/2020   Pneumococcal Conjugate-13 10/20/2012, 10/17/2018   Pneumococcal Polysaccharide-23 10/20/2012   Tdap 07/06/2011    Conditions to be addressed/monitored: DMII and CKD Stage 2  Care Plan : PHARMD MEDICATION MANAGEMENT  Updates made by PLavera Guise RPH since 08/10/2022 12:00 AM     Problem: DISEASE PROGRESSION PREVENTION       Long-Range Goal: T2DM PHARMD   Recent Progress: On track  Priority: High  Note:  Current Barriers:  Unable to independently afford treatment regimen Unable to maintain control of T2DM  Pharmacist Clinical Goal(s):  Over the next 90 days, patient will verbalize ability to afford treatment regimen maintain control of T2DM as evidenced by IMPROVED GLYCEMIC CONTROL  through collaboration with PharmD and provider.    Interventions: 1:1 collaboration with [name], DO regarding development and update of comprehensive plan of care as evidenced by provider attestation and co-signature Inter-disciplinary care team collaboration (see longitudinal plan of care) Comprehensive medication review performed; medication list updated in electronic medical record  Diabetes: CONTROLLED-A1C 8.4-->7.9-->6.8% (IMPROVING) current treatment: RYBELSUS 14MG DAILY, JARDIANCE 25MG, METFORMIN, GLIPIZIDE; GFR 64 INTOLERANCE REPORTED TO FARXIGA, BUT TOLERATING [place] TO D/C GLIPIZIDE CONTINUE RYBELSUS 14MG DAILY SLOWLY TITRATED RYBELSUS DUE TO GI SIDE EFFECTS Denies personal and family history of Medullary thyroid cancer (MTC) SUBMITTED DOSE CHANGE TO NOVO [place] PAP CONTINUE METFORMIN XR 1G AT [place] 25MG DAILY  PATIENT APPROVED FOR BI CARES PATIENT ASSISTANCE UNTIL 11/14/22 Current glucose readings: fasting glucose: <160, as low as 124, post prandial glucose: <200 Denies hypoglycemic/hyperglycemic symptoms Discussed meal planning options and Plate method for healthy eating Avoid sugary drinks and desserts Incorporate balanced protein, non starchy veggies, 1 serving of carbohydrate with each meal Increase water intake Increase physical activity as able  Current exercise: N/A Educated on DIABETES, MEDICATIONS PATIENT APPROVED FOR BI CARES (JARDIANCE) AND NOVO [place] ([place]) PATIENT ASSISTANCE PROGRAMS UNTIL 11/14/22  Patient Goals/Self-Care  Activities Over the next 90 days, patient will:  - take medications as prescribed collaborate with provider on medication access solutions  Follow Up Plan: Telephone follow up appointment with care management team member scheduled for: 3 MONTHS     Follow Up:  Patient agrees to Care Plan and Follow-up.   Regina Eck, PharmD, BCPS Clinical Pharmacist, [place]  [facility] Phone [phone]

## 2022-08-11 ENCOUNTER — Other Ambulatory Visit: Payer: Self-pay

## 2022-08-14 DIAGNOSIS — E785 Hyperlipidemia, unspecified: Secondary | ICD-10-CM

## 2022-08-14 DIAGNOSIS — E119 Type 2 diabetes mellitus without complications: Secondary | ICD-10-CM

## 2022-08-14 DIAGNOSIS — E1169 Type 2 diabetes mellitus with other specified complication: Secondary | ICD-10-CM

## 2022-09-08 ENCOUNTER — Ambulatory Visit: Payer: Medicare HMO

## 2022-09-08 ENCOUNTER — Ambulatory Visit (INDEPENDENT_AMBULATORY_CARE_PROVIDER_SITE_OTHER): Payer: Medicare HMO

## 2022-09-08 DIAGNOSIS — Z23 Encounter for immunization: Secondary | ICD-10-CM | POA: Diagnosis not present

## 2022-10-05 ENCOUNTER — Other Ambulatory Visit: Payer: Self-pay | Admitting: Family Medicine

## 2022-10-05 DIAGNOSIS — G8929 Other chronic pain: Secondary | ICD-10-CM

## 2022-10-10 ENCOUNTER — Other Ambulatory Visit: Payer: Self-pay | Admitting: Family Medicine

## 2022-10-10 DIAGNOSIS — E1159 Type 2 diabetes mellitus with other circulatory complications: Secondary | ICD-10-CM

## 2022-10-11 ENCOUNTER — Telehealth (INDEPENDENT_AMBULATORY_CARE_PROVIDER_SITE_OTHER): Payer: Medicare HMO | Admitting: Family Medicine

## 2022-10-11 ENCOUNTER — Encounter: Payer: Self-pay | Admitting: Family Medicine

## 2022-10-11 DIAGNOSIS — J40 Bronchitis, not specified as acute or chronic: Secondary | ICD-10-CM

## 2022-10-11 DIAGNOSIS — J069 Acute upper respiratory infection, unspecified: Secondary | ICD-10-CM | POA: Diagnosis not present

## 2022-10-11 MED ORDER — AMOXICILLIN-POT CLAVULANATE 875-125 MG PO TABS: 1.0000 | ORAL_TABLET | Freq: Two times a day (BID) | ORAL | 0 refills | Status: DC

## 2022-10-11 MED ORDER — PROMETHAZINE-DM 6.25-15 MG/5ML PO SYRP: 5.0000 mL | ORAL_SOLUTION | Freq: Four times a day (QID) | ORAL | 0 refills | Status: DC | PRN

## 2022-10-11 NOTE — Progress Notes (Signed)
Virtual Visit via telephone Note  I connected with Erica Evans on 10/11/22 at 1310 by telephone and verified that I am speaking with the correct person using two identifiers. Erica Evans is currently located at home and patient are currently with her during visit. The provider, Fransisca Kaufmann Aamilah Augenstein, MD is located in their office at time of visit.  Call ended at 1318  I discussed the limitations, risks, security and privacy concerns of performing an evaluation and management service by telephone and the availability of in person appointments. I also discussed with the patient that there may be a patient responsible charge related to this service. The patient expressed understanding and agreed to proceed.   History and Present Illness: Patient is calling in for sore throat and tooke OTC medicines..  They had flu and covid vaccines 2 weeks ago.  She was feeling feverish and ear congestion.  She taking allegra and mucinex and flonase.  She lost her voice.  Her husband is ill as well. She started with sore throat about 5 days ago.  The feverish started last night.  It is mostly dry hacky cough. She feels somewhat SOB and wheezy.   1. Upper respiratory tract infection, unspecified type   2. Bronchitis     Outpatient Encounter Medications as of 10/11/2022  Medication Sig   amoxicillin-clavulanate (AUGMENTIN) 875-125 MG tablet Take 1 tablet by mouth 2 (two) times daily.   promethazine-dextromethorphan (PROMETHAZINE-DM) 6.25-15 MG/5ML syrup Take 5 mLs by mouth 4 (four) times daily as needed for cough.   amLODipine (NORVASC) 10 MG tablet Take 1 tablet (10 mg total) by mouth daily. (NEEDS TO BE SEEN BEFORE NEXT REFILL)   aspirin EC 81 MG tablet Take 81 mg by mouth daily. Swallow whole.   blood glucose meter kit and supplies Check BS once daily   busPIRone (BUSPAR) 15 MG tablet TAKE 1/2 TO 1 TABLET EVERY 8 HOURS FOR NERVES   Cholecalciferol (D-3-5) 125 MCG (5000 UT) capsule Take  5,000 Units by mouth daily.   diclofenac (VOLTAREN) 75 MG EC tablet Take 1 tablet (75 mg total) by mouth 2 (two) times daily as needed for moderate pain.   empagliflozin (JARDIANCE) 25 MG TABS tablet Take 1 tablet (25 mg total) by mouth daily.   glipiZIDE (GLUCOTROL XL) 10 MG 24 hr tablet Take 1 tablet (10 mg total) by mouth daily with breakfast.   hydrochlorothiazide (HYDRODIURIL) 25 MG tablet Take 1 tablet (25 mg total) by mouth daily. (NEEDS TO BE SEEN BEFORE NEXT REFILL)   lidocaine (LIDODERM) 5 % Place 1 patch onto the skin daily. Remove & Discard patch within 12 hours or as directed by MD   metFORMIN (GLUCOPHAGE-XR) 500 MG 24 hr tablet TAKE 2 TABLETS (1,000 MG TOTAL) BY MOUTH DAILY WITH SUPPER   methocarbamol (ROBAXIN) 500 MG tablet TAKE 1 TABLET (500 MG TOTAL) BY MOUTH 2 (TWO) TIMES DAILY AS NEEDED FOR MUSCLE SPASMS. (Patient not taking: Reported on 02/18/2022)   ONETOUCH VERIO test strip USE TO TEST BLOOD SUGAR ONCE DAILY DX E11.9   pravastatin (PRAVACHOL) 40 MG tablet Take 1 tablet (40 mg total) by mouth daily.   Semaglutide 7 MG TABS Take 14 mg by mouth daily.   No facility-administered encounter medications on file as of 10/11/2022.    Review of Systems  Constitutional:  Positive for fever. Negative for chills.  HENT:  Positive for congestion, postnasal drip, rhinorrhea, sinus pressure and sore throat. Negative for ear discharge, ear pain  and sneezing.   Eyes:  Negative for pain, redness and visual disturbance.  Respiratory:  Positive for cough, shortness of breath and wheezing. Negative for chest tightness.   Cardiovascular:  Negative for chest pain and leg swelling.  Genitourinary:  Negative for difficulty urinating and dysuria.  Musculoskeletal:  Negative for back pain and gait problem.  Skin:  Negative for rash.  Neurological:  Negative for light-headedness and headaches.  Psychiatric/Behavioral:  Negative for agitation and behavioral problems.   All other systems reviewed and  are negative.   Observations/Objective: Patient sounds comfortable and in no acute distress  Assessment and Plan: Problem List Items Addressed This Visit   None Visit Diagnoses     Upper respiratory tract infection, unspecified type    -  Primary   Relevant Medications   amoxicillin-clavulanate (AUGMENTIN) 875-125 MG tablet   promethazine-dextromethorphan (PROMETHAZINE-DM) 6.25-15 MG/5ML syrup   Bronchitis       Relevant Medications   amoxicillin-clavulanate (AUGMENTIN) 875-125 MG tablet   promethazine-dextromethorphan (PROMETHAZINE-DM) 6.25-15 MG/5ML syrup       Mucinex and allegra and amoxicillin and cough syrup Follow up plan: Return if symptoms worsen or fail to improve.     I discussed the assessment and treatment plan with the patient. The patient was provided an opportunity to ask questions and all were answered. The patient agreed with the plan and demonstrated an understanding of the instructions.   The patient was advised to call back or seek an in-person evaluation if the symptoms worsen or if the condition fails to improve as anticipated.  The above assessment and management plan was discussed with the patient. The patient verbalized understanding of and has agreed to the management plan. Patient is aware to call the clinic if symptoms persist or worsen. Patient is aware when to return to the clinic for a follow-up visit. Patient educated on when it is appropriate to go to the emergency department.    I provided 8 minutes of non-face-to-face time during this encounter.    Worthy Rancher, MD

## 2022-10-13 ENCOUNTER — Other Ambulatory Visit: Payer: Self-pay | Admitting: Family Medicine

## 2022-10-13 DIAGNOSIS — R9439 Abnormal result of other cardiovascular function study: Secondary | ICD-10-CM

## 2022-10-15 ENCOUNTER — Ambulatory Visit: Payer: Medicare HMO | Admitting: Family Medicine

## 2022-10-20 ENCOUNTER — Encounter: Payer: Self-pay | Admitting: Vascular Surgery

## 2022-10-20 ENCOUNTER — Ambulatory Visit (INDEPENDENT_AMBULATORY_CARE_PROVIDER_SITE_OTHER): Payer: Medicare HMO

## 2022-10-20 ENCOUNTER — Ambulatory Visit: Payer: Medicare HMO | Admitting: Vascular Surgery

## 2022-10-20 ENCOUNTER — Other Ambulatory Visit: Payer: Self-pay

## 2022-10-20 VITALS — BP 136/82 | HR 90 | Temp 97.9°F | Ht 67.0 in | Wt 200.0 lb

## 2022-10-20 DIAGNOSIS — I6521 Occlusion and stenosis of right carotid artery: Secondary | ICD-10-CM | POA: Diagnosis not present

## 2022-10-20 DIAGNOSIS — I6529 Occlusion and stenosis of unspecified carotid artery: Secondary | ICD-10-CM | POA: Diagnosis not present

## 2022-10-20 NOTE — Progress Notes (Signed)
Vascular and Vein Specialist of [place]  Patient name: Erica Evans MRN: [id number] DOB: 1950-11-29 Sex: female  REASON FOR CONSULT: Evaluation of asymptomatic right carotid stenosis  HPI: Erica Evans is a 71 y.o. female, who is here for evaluation.  She recently underwent a [facility] screening which suggested severe right carotid stenosis.  She is here today for formal duplex and consultation.  She specifically denies any prior history of amaurosis fugax, aphasia, TIA or stroke.  She is right-handed.  She has no history of cardiac disease.  She has a distant history of smoking 50 years ago.  She is a non-insulin-dependent diabetic.  Past Medical History:  Diagnosis Date   Diabetes mellitus without complication ([place])    Hyperlipidemia    Hypertension    Palpitations    Vertigo     Family History  Problem Relation Age of Onset   Coronary artery disease Mother    Diabetes Mother    Heart disease Mother    Coronary artery disease Father    Heart disease Father    Diabetes Sister    Hypertension Sister    Heart disease Sister    Diabetes Brother    Diabetes Sister    Arthritis Sister    Hypertension Sister    Diabetes Sister    Hypertension Sister    Hypertension Sister     SOCIAL HISTORY: Social History   Socioeconomic History   Marital status: Married    Spouse name: Erica Evans   Number of children: 2   Years of education: Not on file   Highest education level: Associate degree: academic program  Occupational History   Occupation: Control and instrumentation engineer    Comment: retired  Tobacco Use   Smoking status: Former    Types: Cigarettes    Quit date: 1990    Years since quitting: 33.9   Smokeless tobacco: Never  Vaping Use   Vaping Use: Never used  Substance and Sexual Activity   Alcohol use: No   Drug use: No   Sexual activity: Not on file  Other Topics Concern   Not on file  Social History Narrative   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 She lives at home with her husband Erica Evans and her grown son. She has 3 children. She enjoys bird watching and walking.    Social Determinants of Health   Financial Resource Strain: Low Risk  (02/18/2022)   Overall Financial Resource Strain (CARDIA)    Difficulty of Paying Living Expenses: Not hard at all  Food Insecurity: No Food Insecurity (02/18/2022)   Hunger Vital Sign    Worried About Running Out of Food in the Last Year: Never true    Ran Out of Food in the Last Year: Never true  Transportation Needs: No Transportation Needs (02/18/2022)   PRAPARE - Hydrologist (Medical): No    Lack of Transportation (Non-Medical): No  Physical Activity: Insufficiently Active (02/18/2022)   Exercise Vital Sign    Days of Exercise per Week: 7 days    Minutes of Exercise per Session: 20 min  Stress: No Stress Concern Present (02/18/2022)   [place]    Feeling of Stress : Only a little  Social Connections: Socially Integrated (02/18/2022)   Social Connection and Isolation Panel [NHANES]    Frequency of Communication with Friends and Family: More than three times a week    Frequency of Social Gatherings  with Friends and Family: More than three times a week    Attends Religious Services: More than 4 times per year    Active Member of Clubs or Organizations: Yes    Attends Archivist Meetings: More than 4 times per year    Marital Status: Married  Human resources officer Violence: Not At Risk (02/18/2022)   Humiliation, Afraid, Rape, and Kick questionnaire    Fear of Current or Ex-Partner: No    Emotionally Abused: No    Physically Abused: No    Sexually Abused: No    Allergies  Allergen Reactions   Lipitor [Atorvastatin]    Morphine And Related    Farxiga [Dapagliflozin]     UTI/CONSTIPATION TOLERATES JARDIANCE    Current Outpatient Medications  Medication Sig Dispense  Refill   amLODipine (NORVASC) 10 MG tablet Take 1 tablet (10 mg total) by mouth daily. (NEEDS TO BE SEEN BEFORE NEXT REFILL) 30 tablet 0   amoxicillin-clavulanate (AUGMENTIN) 875-125 MG tablet Take 1 tablet by mouth 2 (two) times daily. 20 tablet 0   aspirin EC 81 MG tablet Take 81 mg by mouth daily. Swallow whole.     blood glucose meter kit and supplies Check BS once daily 1 each 0   busPIRone (BUSPAR) 15 MG tablet TAKE 1/2 TO 1 TABLET EVERY 8 HOURS FOR NERVES 270 tablet 3   Cholecalciferol (D-3-5) 125 MCG (5000 UT) capsule Take 5,000 Units by mouth daily.     cyclobenzaprine (FLEXERIL) 5 MG tablet Take 5 mg by mouth at bedtime.     diclofenac (VOLTAREN) 75 MG EC tablet Take 1 tablet (75 mg total) by mouth 2 (two) times daily as needed for moderate pain. 30 tablet 0   empagliflozin (JARDIANCE) 25 MG TABS tablet Take 1 tablet (25 mg total) by mouth daily. 90 tablet 3   glipiZIDE (GLUCOTROL XL) 10 MG 24 hr tablet Take 1 tablet (10 mg total) by mouth daily with breakfast. 90 tablet 1   hydrochlorothiazide (HYDRODIURIL) 25 MG tablet Take 1 tablet (25 mg total) by mouth daily. (NEEDS TO BE SEEN BEFORE NEXT REFILL) 30 tablet 0   lidocaine (LIDODERM) 5 % Place 1 patch onto the skin daily. Remove & Discard patch within 12 hours or as directed by MD 30 patch 0   metFORMIN (GLUCOPHAGE-XR) 500 MG 24 hr tablet TAKE 2 TABLETS (1,000 MG TOTAL) BY MOUTH DAILY WITH SUPPER 180 tablet 1   ONETOUCH VERIO test strip USE TO TEST BLOOD SUGAR ONCE DAILY DX E11.9 100 strip 3   pravastatin (PRAVACHOL) 40 MG tablet Take 1 tablet (40 mg total) by mouth daily. 90 tablet 1   promethazine-dextromethorphan (PROMETHAZINE-DM) 6.25-15 MG/5ML syrup Take 5 mLs by mouth 4 (four) times daily as needed for cough. 118 mL 0   Semaglutide 7 MG TABS Take 14 mg by mouth daily.     methocarbamol (ROBAXIN) 500 MG tablet TAKE 1 TABLET (500 MG TOTAL) BY MOUTH 2 (TWO) TIMES DAILY AS NEEDED FOR MUSCLE SPASMS. (Patient not taking: Reported on  02/18/2022) 15 tablet 0   No current facility-administered medications for this visit.    REVIEW OF SYSTEMS:  _0  denotes positive finding, _1  denotes negative finding Cardiac  Comments:  Chest pain or chest pressure:    Shortness of breath upon exertion: x   Short of breath when lying flat: x   Irregular heart rhythm: x       Vascular    Pain in calf, thigh, or hip brought on by  ambulation:    Pain in feet at night that wakes you up from your sleep:     Blood clot in your veins:    Leg swelling:  x       Pulmonary    Oxygen at home:    Productive cough:     Wheezing:         Neurologic    Sudden weakness in arms or legs:     Sudden numbness in arms or legs:     Sudden onset of difficulty speaking or slurred speech:    Temporary loss of vision in one eye:     Problems with dizziness:         Gastrointestinal    Blood in stool:     Vomited blood:         Genitourinary    Burning when urinating:     Blood in urine:        Psychiatric    Major depression:         Hematologic    Bleeding problems:    Problems with blood clotting too easily:        Skin    Rashes or ulcers:        Constitutional    Fever or chills:      PHYSICAL EXAM: Vitals:   10/20/22 1336 10/20/22 1339  BP: 138/81 136/82  Pulse: 90   Temp: 97.9 F (36.6 C)   SpO2: 98%   Weight: 200 lb (90.7 kg)   Height: _0  (1.702 m)     GENERAL: The patient is a well-nourished female, in no acute distress. The vital signs are documented above. CARDIOVASCULAR: 2+ radial pulses bilaterally.  Carotid arteries without bruits bilaterally. PULMONARY: There is good air exchange  MUSCULOSKELETAL: There are no major deformities or cyanosis. NEUROLOGIC: No focal weakness or paresthesias are detected. SKIN: There are no ulcers or rashes noted. PSYCHIATRIC: The patient has a normal affect.  DATA:  Carotid duplex today reveals greater than 80% right internal carotid artery stenosis.  No significant  stenosis in the left carotid artery.  MEDICAL ISSUES: Severe asymptomatic right carotid stenosis.  I discussed the significance of this with the patient.  I explained that this puts her at approximately 5 %/year risk of neurologic deficit.  I have recommended CT angiogram for further evaluation.  Discussed both standard carotid endarterectomy andTCAR treatment if her carotid stenosis extends into her higher cervical internal carotid artery.  Discussed that both these procedures in detail including 1-1 and half percent risk of stroke with surgery and also the slight risk of cranial nerve injury.  We will obtain outpatient CT angiogram at [facility] and I will discuss recommendations further with her by telephone.  She understands surgery would be done at [facility] in [place] with one of my partners.   Rosetta Posner, MD FACS [facility] [phone] Pager [phone]  Note: Portions of this report may have been transcribed using voice recognition software.  Every effort has been made to ensure accuracy; however, inadvertent computerized transcription errors may still be present.

## 2022-10-26 ENCOUNTER — Telehealth: Payer: Self-pay | Admitting: Pharmacist

## 2022-10-26 ENCOUNTER — Ambulatory Visit (INDEPENDENT_AMBULATORY_CARE_PROVIDER_SITE_OTHER): Payer: Medicare HMO | Admitting: Pharmacist

## 2022-10-26 DIAGNOSIS — E1169 Type 2 diabetes mellitus with other specified complication: Secondary | ICD-10-CM

## 2022-10-26 DIAGNOSIS — G720 Drug-induced myopathy: Secondary | ICD-10-CM

## 2022-10-26 MED ORDER — RYBELSUS 14 MG PO TABS: 14.0000 mg | ORAL_TABLET | Freq: Every day | ORAL | 4 refills | Status: DC

## 2022-10-26 NOTE — Progress Notes (Signed)
Chronic Care Management Pharmacy Note  10/26/2022 Name:  Erica Evans MRN:  [id number] DOB:  04-17-1951  Summary: Diabetes: CONTROLLED-A1C 8.4-->6.8% Current treatment:  RYBELSUS 14MG DAILY, JARDIANCE 25MG, METFORMIN, GLIPIZIDE; GFR 66 INTOLERANCE REPORTED TO FARXIGA, BUT TOLERATES JARDIANCE CONSIDER D/C GLIPIZIDE CONTINUE RYBELSUS 14MG DAILY SLOWLY TITRATED RYBELSUS DUE TO GI SIDE EFFECTS Denies personal and family history of Medullary thyroid cancer (MTC) SUBMITTED DOSE CHANGE TO NOVO [place] PAP CONTINUE METFORMIN XR 1G AT [place] ONLY CONTINUE JARDIANCE 25MG DAILY  PATIENT RE-ENROLLED FOR BI CARES PATIENT ASSISTANCE UNTIL 11/15/23 Current glucose readings: fasting glucose: <150, as low as 124, post prandial glucose: <200 Denies hypoglycemic/hyperglycemic symptoms Discussed meal planning options and Plate method for healthy eating Avoid sugary drinks and desserts Incorporate balanced protein, non starchy veggies, 1 serving of carbohydrate with each meal Increase water intake Increase physical activity as able  Current exercise: N/A Educated on DIABETES, MEDICATIONS PATIENT TO BE RE-ENROLLED FOR BI CARES (JARDIANCE) AND NOVO [place] (RYBELSUS) PATIENT ASSISTANCE PROGRAMS FOR 2024 NOTE ROUTED TO SCHEDULE F/U PCP 6 MONTHS POST LAST PCP APPT   Hyperlipidemia: Recommended FOLLOWING A HEART HEALTHY DIET/HEALTHY PLATE METHOD Statin [id number] Tolerating pravastatin "okay" LDL slightly above goal Will continue current management for now/LFTs within normal limits Consider ezetimibe (Zetia) in future  Subjective: Erica Evans is an 71 y.o. year old female who is a primary patient of [name], DO.  The patient was referred to the Chronic Care Management team for assistance with care management needs subsequent to provider initiation of CCM services and plan of care.    Engaged with patient by telephone for follow up visit in response to provider  referral for CCM services.   Objective:  LABS:    Lab Results  Component Value Date   CREATININE 0.94 05/12/2022   CREATININE 0.97 03/11/2021   CREATININE 0.93 02/08/2020     Lab Results  Component Value Date   HGBA1C 6.8 (H) 05/12/2022         Component Value Date/Time   CHOL 158 05/12/2022 0912   CHOL 119 04/17/2013 1110   TRIG 135 05/12/2022 0912   TRIG 178 (H) 09/19/2014 0918   TRIG 148 04/17/2013 1110   HDL 53 05/12/2022 0912   HDL 50 09/19/2014 0918   HDL 49 04/17/2013 1110   CHOLHDL 3.0 05/12/2022 0912   LDLCALC 81 05/12/2022 0912   LDLCALC 99 06/14/2014 0859   LDLCALC 40 04/17/2013 1110     Clinical ASCVD: No   The 10-year ASCVD risk score (Arnett DK, et al., 2019) is: 24.1%   Values used to calculate the score:     Age: 30 years     Sex: Female     Is Non-Hispanic African American: No     Diabetic: Yes     Tobacco smoker: No     Systolic Blood Pressure: 915 mmHg     Is BP treated: Yes     HDL Cholesterol: 53 mg/dL     Total Cholesterol: 158 mg/dL    Other: (CHADS2VASc if Afib, PHQ9 if depression, MMRC or CAT for COPD, ACT, DEXA)    BP Readings from Last 3 Encounters:  10/20/22 136/82  05/12/22 136/79  01/05/22 128/69      SDOH:  (Social Determinants of Health) assessments and interventions performed:    Allergies  Allergen Reactions   Lipitor [Atorvastatin]     Myopathy/fibromyalgia    Morphine And Related    Farxiga [Dapagliflozin]  UTI/CONSTIPATION TOLERATES JARDIANCE    Medications Reviewed Today     Reviewed by Lavera Guise, [facility] (Pharmacist) on 10/26/22 at [phone]  Med List Status: <None>   Medication Order Taking? Sig Documenting Provider Last Dose Status Informant  amLODipine (NORVASC) 10 MG tablet [id number] No Take 1 tablet (10 mg total) by mouth daily. (NEEDS TO BE SEEN BEFORE NEXT REFILL) Ronnie Doss M, DO Taking Active   amoxicillin-clavulanate (AUGMENTIN) 875-125 MG tablet [id number] No Take 1 tablet by mouth 2 (two)  times daily. Dettinger, Fransisca Kaufmann, MD Taking Active   aspirin EC 81 MG tablet [id number] No Take 81 mg by mouth daily. Swallow whole. [provider] Taking Active   blood glucose meter kit and supplies [id number] No Check BS once daily Timmothy Euler, MD Taking Active   busPIRone (BUSPAR) 15 MG tablet [id number] No TAKE 1/2 TO 1 TABLET EVERY 8 HOURS FOR NERVES [name], DO Taking Active            Med Note (HOPKINS, AMY E   Thu Feb 18, 2022  9:12 AM) Dewaine Conger 1/2 pill at night  Cholecalciferol (D-3-5) 125 MCG (5000 UT) capsule [id number] No Take 5,000 Units by mouth daily. [provider] Taking Active   cyclobenzaprine (FLEXERIL) 5 MG tablet [id number] No Take 5 mg by mouth at bedtime. [provider] Taking Active   diclofenac (VOLTAREN) 75 MG EC tablet [id number] No Take 1 tablet (75 mg total) by mouth 2 (two) times daily as needed for moderate pain. Ronnie Doss M, DO Taking Active   empagliflozin (JARDIANCE) 25 MG TABS tablet [id number] No Take 1 tablet (25 mg total) by mouth daily. Ronnie Doss M, DO Taking Active   glipiZIDE (GLUCOTROL XL) 10 MG 24 hr tablet [id number] No Take 1 tablet (10 mg total) by mouth daily with breakfast. Ronnie Doss M, DO Taking Active   hydrochlorothiazide (HYDRODIURIL) 25 MG tablet [id number] No Take 1 tablet (25 mg total) by mouth daily. (NEEDS TO BE SEEN BEFORE NEXT REFILL) Ronnie Doss M, DO Taking Active   lidocaine (LIDODERM) 5 % [id number] No Place 1 patch onto the skin daily. Remove & Discard patch within 12 hours or as directed by MD [name], DO Taking Active   metFORMIN (GLUCOPHAGE-XR) 500 MG 24 hr tablet [id number] No TAKE 2 TABLETS (1,000 MG TOTAL) BY MOUTH DAILY WITH SUPPER Ronnie Doss M, DO Taking Active   methocarbamol (ROBAXIN) 500 MG tablet [id number] No TAKE 1 TABLET (500 MG TOTAL) BY MOUTH 2 (TWO) TIMES DAILY AS NEEDED FOR MUSCLE SPASMS.  Patient not taking: Reported on 02/18/2022    [name], DO Not Taking Active   [facility] VERIO test strip [id number] No USE TO TEST BLOOD SUGAR ONCE DAILY DX E11.9 Ronnie Doss M, DO Taking Active   pravastatin (PRAVACHOL) 40 MG tablet [id number] No Take 1 tablet (40 mg total) by mouth daily. Ronnie Doss M, DO Taking Active   promethazine-dextromethorphan (PROMETHAZINE-DM) 6.25-15 MG/5ML syrup [id number] No Take 5 mLs by mouth 4 (four) times daily as needed for cough. Dettinger, Fransisca Kaufmann, MD Taking Active   Semaglutide 7 MG TABS [id number] No Take 14 mg by mouth daily. [provider] Taking Active            Med Note Blanca Friend, Sherian Maroon Jul 09, 2021 11:54 AM) VIA NOVO [place] PATIENT ASSISTANCE              Goals Addressed  This Visit's Progress     Patient Stated     T2DM-PHARMD GOAL (pt-stated)        Current Barriers:  Unable to independently afford treatment regimen Unable to maintain control of T2DM  Pharmacist Clinical Goal(s):  Over the next 90 days, patient will verbalize ability to afford treatment regimen maintain control of T2DM as evidenced by IMPROVED GLYCEMIC CONTROL  through collaboration with PharmD and provider.    Interventions: 1:1 collaboration with [name], DO regarding development and update of comprehensive plan of care as evidenced by provider attestation and co-signature Inter-disciplinary care team collaboration (see longitudinal plan of care) Comprehensive medication review performed; medication list updated in electronic medical record  Diabetes: CONTROLLED-A1C 8.4-->6.8% Current treatment:  RYBELSUS 14MG DAILY, JARDIANCE 25MG, METFORMIN, GLIPIZIDE; GFR 66 INTOLERANCE REPORTED TO FARXIGA, BUT TOLERATES JARDIANCE CONSIDER D/C GLIPIZIDE CONTINUE RYBELSUS 14MG DAILY SLOWLY TITRATED RYBELSUS DUE TO GI SIDE EFFECTS Denies personal and family history of Medullary thyroid cancer (MTC) SUBMITTED DOSE CHANGE TO NOVO [place] PAP CONTINUE  METFORMIN XR 1G AT [place] 25MG DAILY  PATIENT RE-ENROLLED FOR BI CARES PATIENT ASSISTANCE UNTIL 11/15/23 Current glucose readings: fasting glucose: <150, as low as 124, post prandial glucose: <200 Denies hypoglycemic/hyperglycemic symptoms Discussed meal planning options and Plate method for healthy eating Avoid sugary drinks and desserts Incorporate balanced protein, non starchy veggies, 1 serving of carbohydrate with each meal Increase water intake Increase physical activity as able  Current exercise: N/A Educated on DIABETES, MEDICATIONS PATIENT TO BE RE-ENROLLED FOR BI CARES (JARDIANCE) AND NOVO [place] (RYBELSUS) PATIENT ASSISTANCE PROGRAMS FOR 2024 NOTE ROUTED TO SCHEDULE F/U PCP 6 MONTHS POST LAST PCP APPT   Hyperlipidemia: Recommended FOLLOWING A HEART HEALTHY DIET/HEALTHY PLATE METHOD Statin [id number] Tolerating pravastatin "okay" LDL slightly above goal Will continue current management for now/LFTs within normal limits Consider ezetimibe (Zetia) in future Lipid Panel     Component Value Date/Time   CHOL 158 05/12/2022 0912   CHOL 119 04/17/2013 1110   TRIG 135 05/12/2022 0912   TRIG 178 (H) 09/19/2014 0918   TRIG 148 04/17/2013 1110   HDL 53 05/12/2022 0912   HDL 50 09/19/2014 0918   HDL 49 04/17/2013 1110   CHOLHDL 3.0 05/12/2022 0912   LDLCALC 81 05/12/2022 0912   LDLCALC 99 06/14/2014 0859   LDLCALC 40 04/17/2013 1110   LABVLDL 24 05/12/2022 0912   Patient Goals/Self-Care Activities Over the next 90 days, patient will:  - take medications as prescribed collaborate with provider on medication access solutions  Follow Up Plan: Telephone follow up appointment with care management team member scheduled for: 3 MONTHSBarriers:  Unable to independently afford treatment regimen Unable to maintain control of T2DM  Pharmacist Clinical Goal(s):  Over the next 90 days, patient will verbalize ability to afford treatment regimen maintain  control of T2DM as evidenced by IMPROVED GLYCEMIC CONTROL  through collaboration with PharmD and provider.    Interventions: 1:1 collaboration with [name], DO regarding development and update of comprehensive plan of care as evidenced by provider attestation and co-signature Inter-disciplinary care team collaboration (see longitudinal plan of care) Comprehensive medication review performed; medication list updated in electronic medical record  Diabetes: CONTROLLED-A1C 8.4-->7.9-->6.8% (IMPROVING) current treatment: RYBELSUS 14MG DAILY, JARDIANCE 25MG, METFORMIN, GLIPIZIDE; GFR 64 INTOLERANCE REPORTED TO FARXIGA, BUT TOLERATING JARDIANCE WELL PLAN TO D/C GLIPIZIDE CONTINUE RYBELSUS 14MG DAILY SLOWLY TITRATED RYBELSUS DUE TO GI SIDE EFFECTS Denies personal and family history of Medullary thyroid cancer (MTC) SUBMITTED DOSE CHANGE  TO NOVO [place] PAP CONTINUE METFORMIN XR 1G AT [place] 25MG DAILY  PATIENT APPROVED FOR BI CARES PATIENT ASSISTANCE UNTIL 11/14/22 Current glucose readings: fasting glucose: <160, as low as 124, post prandial glucose: <200 Denies hypoglycemic/hyperglycemic symptoms Discussed meal planning options and Plate method for healthy eating Avoid sugary drinks and desserts Incorporate balanced protein, non starchy veggies, 1 serving of carbohydrate with each meal Increase water intake Increase physical activity as able  Current exercise: N/A Educated on DIABETES, MEDICATIONS PATIENT APPROVED FOR BI CARES (JARDIANCE) AND NOVO [place] (RYBELSUS) PATIENT ASSISTANCE PROGRAMS UNTIL 11/14/22  Patient Goals/Self-Care Activities Over the next 90 days, patient will:  - take medications as prescribed collaborate with provider on medication access solutions  Follow Up Plan: Telephone follow up appointment with care management team member scheduled for: 3 MONTHS        Plan: Next PCP appointment scheduled for:  12/2022 Reached out to  [facility] RN to schedule eye exam for this year (last 06/2021)  Regina Eck, PharmD, BCPS, [place] Clinical Pharmacist, [place]  II  T [phone]

## 2022-10-26 NOTE — Patient Instructions (Addendum)
Visit Information  Following are the goals we discussed today:  Current Barriers:  Unable to independently afford treatment regimen Unable to maintain control of T2DM  Pharmacist Clinical Goal(s):  Over the next 90 days, patient will verbalize ability to afford treatment regimen maintain control of T2DM as evidenced by IMPROVED GLYCEMIC CONTROL  through collaboration with PharmD and provider.    Interventions: 1:1 collaboration with [name], DO regarding development and update of comprehensive plan of care as evidenced by provider attestation and co-signature Inter-disciplinary care team collaboration (see longitudinal plan of care) Comprehensive medication review performed; medication list updated in electronic medical record  Diabetes: CONTROLLED-A1C 8.4-->6.8% Current treatment:  RYBELSUS '14MG'$  DAILY, JARDIANCE '25MG'$ , METFORMIN, GLIPIZIDE; GFR 66 INTOLERANCE REPORTED TO FARXIGA, BUT TOLERATES JARDIANCE CONSIDER D/C GLIPIZIDE CONTINUE RYBELSUS '14MG'$  DAILY SLOWLY TITRATED RYBELSUS DUE TO GI SIDE EFFECTS Denies personal and family history of Medullary thyroid cancer (MTC) SUBMITTED DOSE CHANGE TO NOVO [place] PAP CONTINUE METFORMIN XR 1G AT [place] '25MG'$  DAILY  PATIENT RE-ENROLLED FOR BI CARES PATIENT ASSISTANCE UNTIL 11/15/23 Current glucose readings: fasting glucose: <150, as low as 124, post prandial glucose: <200 Denies hypoglycemic/hyperglycemic symptoms Discussed meal planning options and Plate method for healthy eating Avoid sugary drinks and desserts Incorporate balanced protein, non starchy veggies, 1 serving of carbohydrate with each meal Increase water intake Increase physical activity as able  Current exercise: N/A Educated on DIABETES, MEDICATIONS PATIENT TO BE RE-ENROLLED FOR BI CARES (JARDIANCE) AND NOVO [place] (RYBELSUS) PATIENT ASSISTANCE PROGRAMS FOR 2024 NOTE ROUTED TO SCHEDULE F/U PCP 6 MONTHS POST LAST PCP APPT    Hyperlipidemia: Recommended FOLLOWING A HEART HEALTHY DIET/HEALTHY PLATE METHOD Statin [id number] Tolerating pravastatin "okay" LDL slightly above goal Will continue current management for now/LFTs within normal limits Consider ezetimibe (Zetia) in future Lipid Panel     Component Value Date/Time   CHOL 158 05/12/2022 0912   CHOL 119 04/17/2013 1110   TRIG 135 05/12/2022 0912   TRIG 178 (H) 09/19/2014 0918   TRIG 148 04/17/2013 1110   HDL 53 05/12/2022 0912   HDL 50 09/19/2014 0918   HDL 49 04/17/2013 1110   CHOLHDL 3.0 05/12/2022 0912   LDLCALC 81 05/12/2022 0912   LDLCALC 99 06/14/2014 0859   LDLCALC 40 04/17/2013 1110   LABVLDL 24 05/12/2022 0912    Patient Goals/Self-Care Activities Over the next 90 days, patient will:  - take medications as prescribed collaborate with provider on medication access solutions  Follow Up Plan: Telephone follow up appointment with care management team member scheduled for: 3 MONTHS  Plan: Next PCP appointment scheduled for:  12/2022--note put in to schedule patient  Regina Eck, PharmD, BCPS, [place] Clinical Pharmacist, [place]  II  T [phone]  Please call the care guide team at [phone] if you need to cancel or reschedule your appointment.   The patient verbalized understanding of instructions, educational materials, and care plan provided today and DECLINED offer to receive copy of patient instructions, educational materials, and care plan.

## 2022-10-26 NOTE — Telephone Encounter (Signed)
Patient needs follow up A1c with PCP (likely needs visit) Requesting appt for February 2024 (having surgery in Jan) Last A1c 6.8% on 05/12/22

## 2022-10-26 NOTE — Telephone Encounter (Signed)
Not urgent:  Can you f/u and ensure patient is re-enrolled for Rybelsus '14mg'$  and Jardiance '25mg'$  daily?  I see where I faxed you papers on jardiance on 10/31, but I wasn't sure if I did the Rybelsus '14mg'$   Thank you!!

## 2022-10-27 NOTE — Progress Notes (Signed)
[facility] Quality Team Note  Name: Erica Evans Date of Birth: Mar 26, 1951 MRN: [id number] Date: 10/27/2022  [facility] Quality Team has reviewed this patient's chart, please see recommendations below:  Diabetic Retinal Eye Exam;  [facility] Quality Other; (Called [facility] to offer the [place] [facility] eye event 10/28/2022, patient stated that she has an upcoming appointment in January to have this completed.)

## 2022-10-28 ENCOUNTER — Telehealth: Payer: Self-pay | Admitting: Pharmacist

## 2022-10-28 NOTE — Telephone Encounter (Signed)
Re-enroll patient for rybelsus '14mg'$  daily  Thank you!

## 2022-11-01 ENCOUNTER — Other Ambulatory Visit: Payer: Self-pay

## 2022-11-01 DIAGNOSIS — I6521 Occlusion and stenosis of right carotid artery: Secondary | ICD-10-CM

## 2022-11-01 DIAGNOSIS — I6529 Occlusion and stenosis of unspecified carotid artery: Secondary | ICD-10-CM

## 2022-11-02 ENCOUNTER — Other Ambulatory Visit: Payer: Self-pay | Admitting: Family Medicine

## 2022-11-02 DIAGNOSIS — I152 Hypertension secondary to endocrine disorders: Secondary | ICD-10-CM

## 2022-11-02 NOTE — Telephone Encounter (Signed)
[facility] NTBS 30 days given 10/11/22

## 2022-11-03 MED ORDER — HYDROCHLOROTHIAZIDE 25 MG PO TABS: 25.0000 mg | ORAL_TABLET | Freq: Every day | ORAL | 0 refills | Status: DC

## 2022-11-03 MED ORDER — AMLODIPINE BESYLATE 10 MG PO TABS: 10.0000 mg | ORAL_TABLET | Freq: Every day | ORAL | 0 refills | Status: DC

## 2022-11-03 NOTE — Addendum Note (Signed)
Addended by: Antonietta Barcelona D on: 11/03/2022 10:04 AM   Modules accepted: Orders

## 2022-11-03 NOTE — Telephone Encounter (Signed)
Pt made appt for 01/22 with Gottschalk. Pt said she is supposed to have surgery in January and not sure of date

## 2022-11-06 IMAGING — DX DG LUMBAR SPINE 2-3V
2 series · 2 of 2 positions shown · non-contrast
Comparison: None.

CLINICAL DATA: Low back pain

EXAM:
LUMBAR SPINE - 2 VIEW

[l-spine lat]
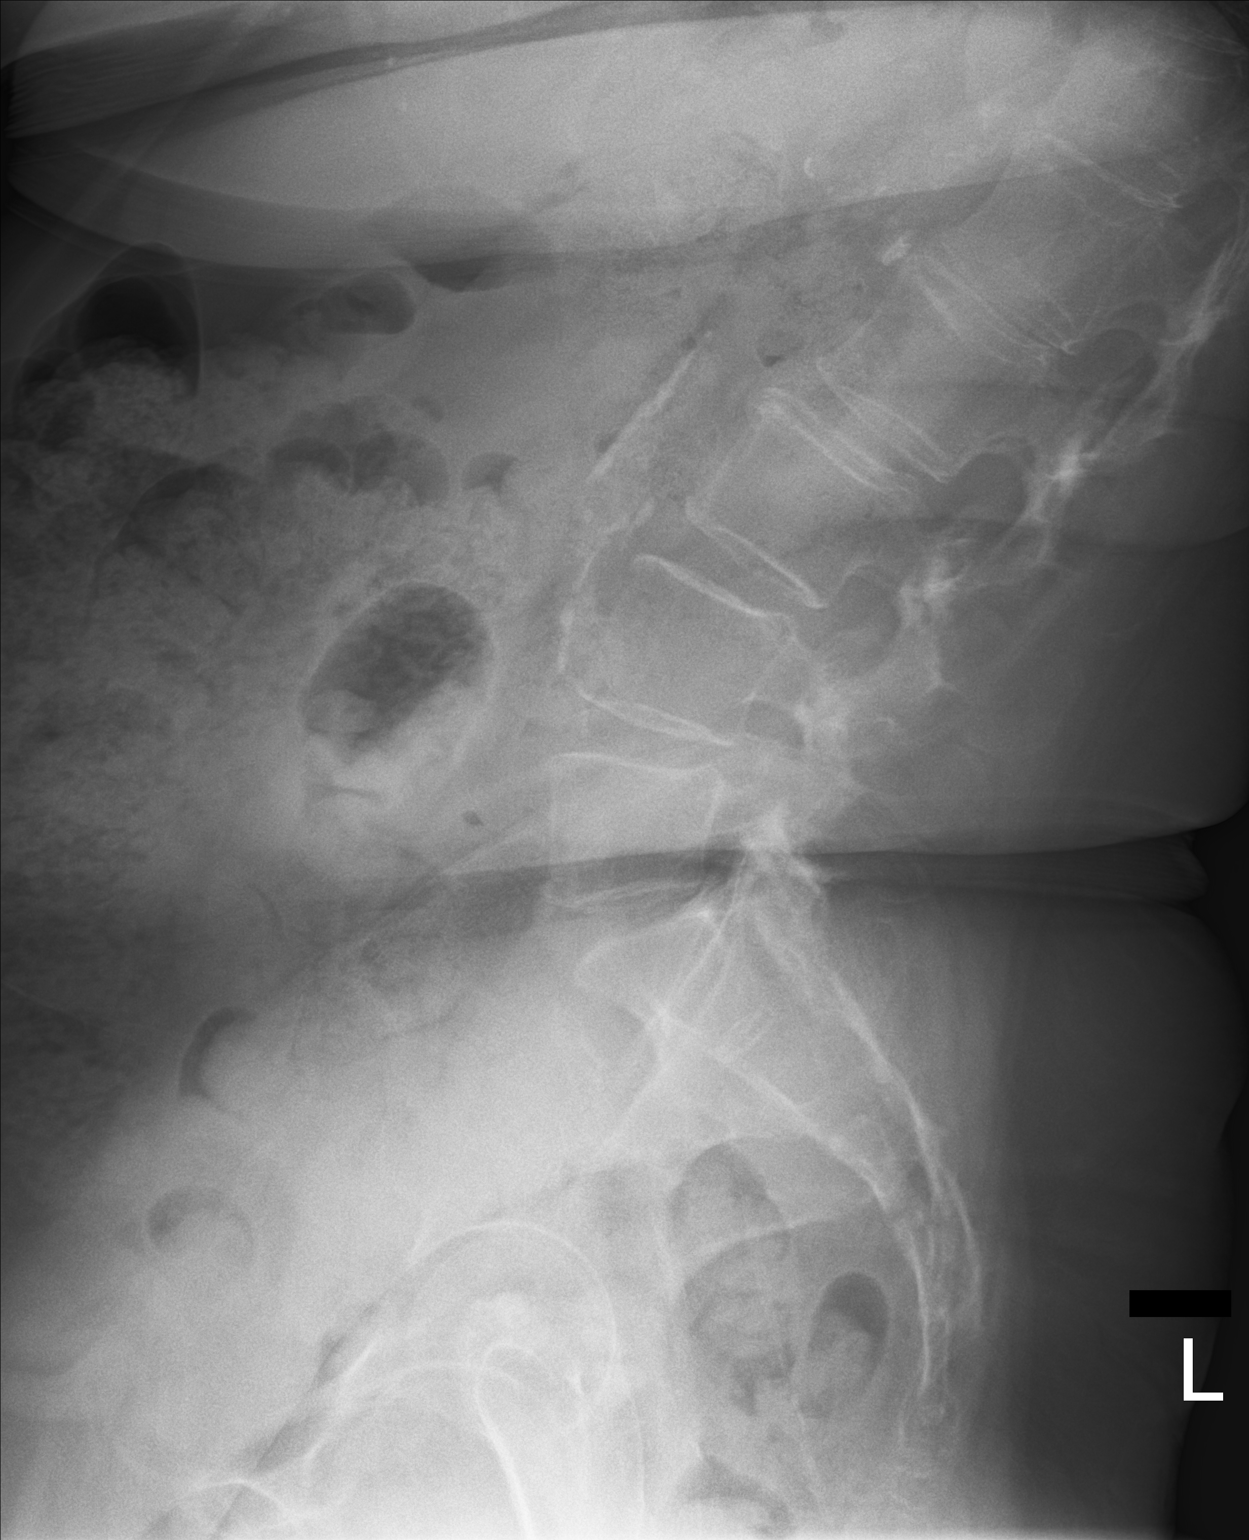

[l-spine ap]
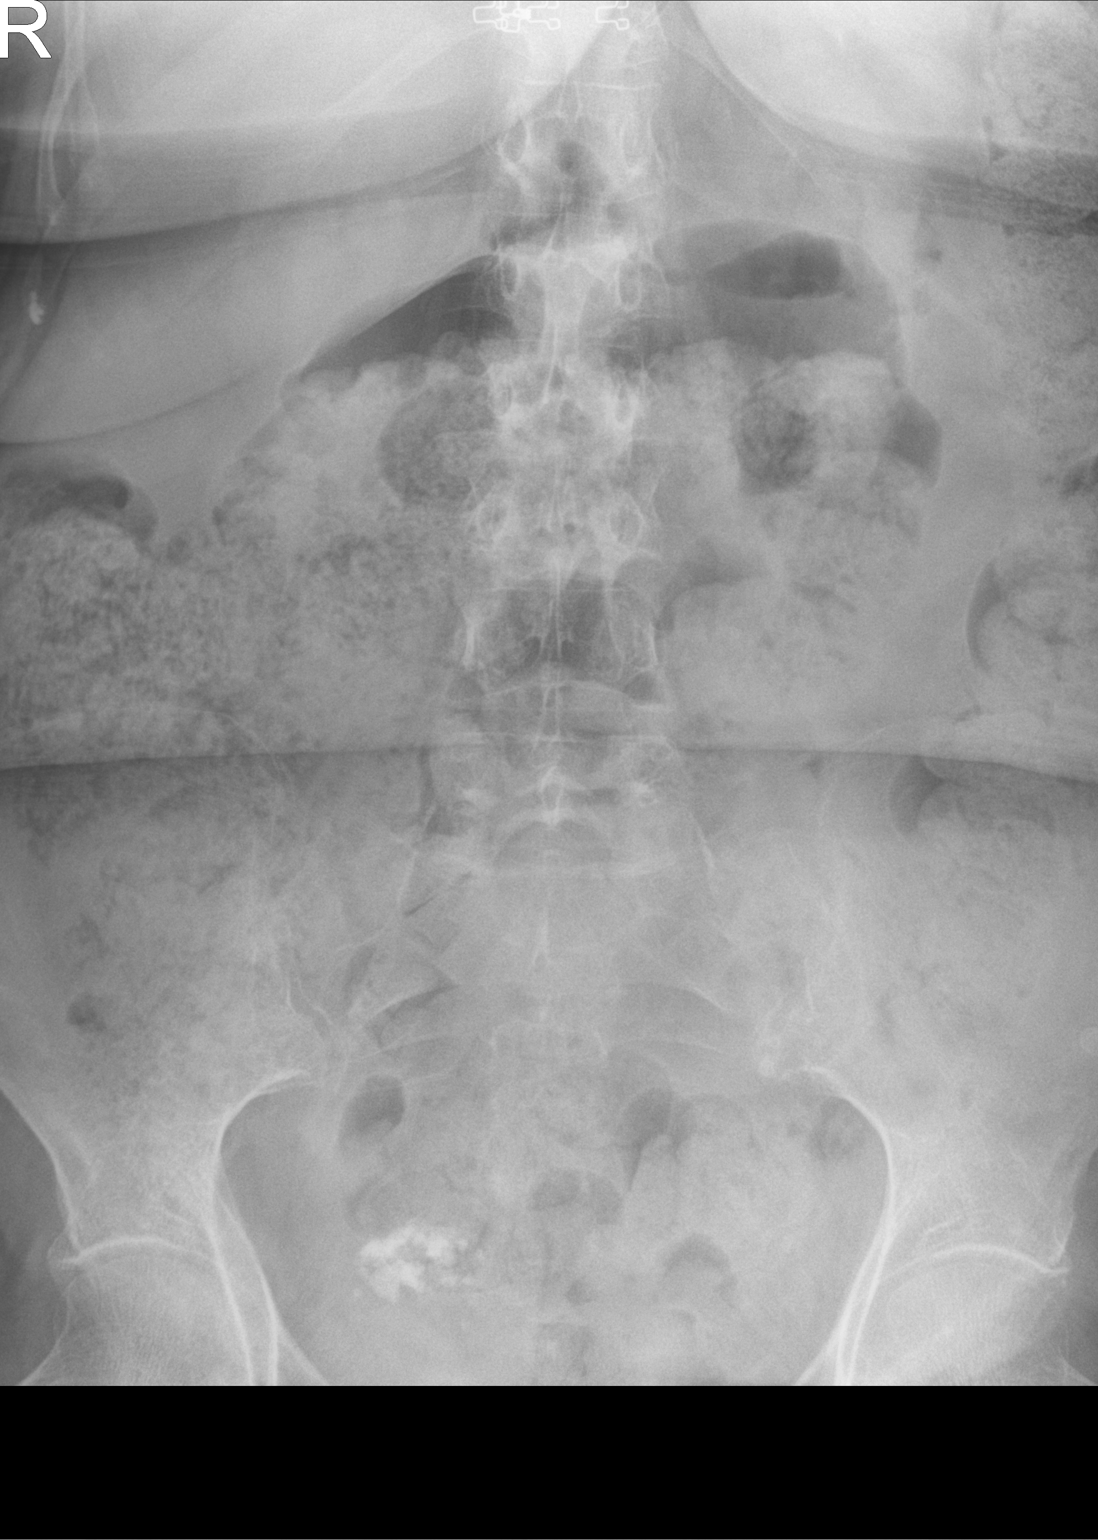

[2 of 2 positions shown; findings below may reference images not displayed]

FINDINGS: Five lumbar type vertebral bodies are well visualized. Vertebral
body height is well maintained. Mild retrolisthesis of L2 on L3 and
L3 on L4 is noted of a degenerative nature. Aortic calcifications
are seen. Diffuse retained fecal material is noted consistent with
colonic constipation. Calcified uterine fibroids are noted.
IMPRESSION: Mild degenerative changes of lumbar spine.

Colonic constipation as described.

## 2022-11-10 ENCOUNTER — Ambulatory Visit: Payer: Medicare HMO | Admitting: Vascular Surgery

## 2022-11-14 DIAGNOSIS — E785 Hyperlipidemia, unspecified: Secondary | ICD-10-CM

## 2022-11-14 DIAGNOSIS — E1169 Type 2 diabetes mellitus with other specified complication: Secondary | ICD-10-CM

## 2022-11-26 ENCOUNTER — Ambulatory Visit (HOSPITAL_COMMUNITY)
Admission: RE | Admit: 2022-11-26 | Discharge: 2022-11-26 | Disposition: A | Discharge status: Home / Self Care | Payer: Medicare HMO | Source: Ambulatory Visit | Attending: Vascular Surgery | Admitting: Vascular Surgery

## 2022-11-26 DIAGNOSIS — I6529 Occlusion and stenosis of unspecified carotid artery: Secondary | ICD-10-CM | POA: Diagnosis present

## 2022-11-26 DIAGNOSIS — I6521 Occlusion and stenosis of right carotid artery: Secondary | ICD-10-CM | POA: Insufficient documentation

## 2022-11-26 MED ORDER — IOHEXOL 350 MG/ML SOLN
75.0000 mL | Freq: Once | INTRAVENOUS | Status: AC | PRN
  Administered 2022-11-26: 75 mL via INTRAVENOUS

## 2022-12-01 ENCOUNTER — Ambulatory Visit: Payer: Medicare HMO | Admitting: Vascular Surgery

## 2022-12-01 ENCOUNTER — Other Ambulatory Visit: Payer: Self-pay | Admitting: *Deleted

## 2022-12-01 ENCOUNTER — Encounter: Payer: Self-pay | Admitting: Vascular Surgery

## 2022-12-01 ENCOUNTER — Encounter: Payer: Self-pay | Admitting: *Deleted

## 2022-12-01 DIAGNOSIS — I6521 Occlusion and stenosis of right carotid artery: Secondary | ICD-10-CM

## 2022-12-01 NOTE — Progress Notes (Signed)
I connected with  Erica Evans on 12/01/22 via telephone and verified that I am speaking with the correct person using two identifiers.   I discussed the limitations of evaluation and management by telemedicine. The patient expressed understanding and agreed to proceed.

## 2022-12-01 NOTE — Progress Notes (Signed)
Virtual Visit via Telephone Note  I connected with Erica Evans on 12/01/2022 using the [employer] by telephone and verified that I was speaking with the correct person using two identifiers. Patient was located at home and accompanied by no one. I am located at office.   The limitations of evaluation and management by telemedicine and the availability of in person appointments have been previously discussed with the patient and are documented in the patients chart. The patient expressed understanding and consented to proceed.  PCP: [name], DO   Chief Complaint: Follow-up asymptomatic carotid stenosis  History of Present Illness: Erica Evans is a 72 y.o. female with initially seen in the office by myself on 10/20/2022.  She had [facility] screening showing severe right carotid stenosis.  Duplex could not determine if she had normal internal carotid past the bifurcation therefore I recommended CT angiogram.  I am discussing of this with her by telephone today  Past Medical History:  Diagnosis Date   Diabetes mellitus without complication ([place])    Hyperlipidemia    Hypertension    Palpitations    Vertigo     History reviewed. No pertinent surgical history.  Current Meds  Medication Sig   amLODipine (NORVASC) 10 MG tablet Take 1 tablet (10 mg total) by mouth daily.   aspirin EC 81 MG tablet Take 81 mg by mouth daily. Swallow whole.   blood glucose meter kit and supplies Check BS once daily   busPIRone (BUSPAR) 15 MG tablet TAKE 1/2 TO 1 TABLET EVERY 8 HOURS FOR NERVES   Cholecalciferol (D-3-5) 125 MCG (5000 UT) capsule Take 5,000 Units by mouth daily.   cyclobenzaprine (FLEXERIL) 5 MG tablet Take 5 mg by mouth at bedtime.   diclofenac (VOLTAREN) 75 MG EC tablet Take 1 tablet (75 mg total) by mouth 2 (two) times daily as needed for moderate pain.   empagliflozin (JARDIANCE) 25 MG TABS tablet Take 1 tablet (25 mg total) by mouth daily.   glipiZIDE  (GLUCOTROL XL) 10 MG 24 hr tablet Take 1 tablet (10 mg total) by mouth daily with breakfast.   hydrochlorothiazide (HYDRODIURIL) 25 MG tablet Take 1 tablet (25 mg total) by mouth daily.   lidocaine (LIDODERM) 5 % Place 1 patch onto the skin daily. Remove & Discard patch within 12 hours or as directed by MD   metFORMIN (GLUCOPHAGE-XR) 500 MG 24 hr tablet TAKE 2 TABLETS (1,000 MG TOTAL) BY MOUTH DAILY WITH SUPPER   ONETOUCH VERIO test strip USE TO TEST BLOOD SUGAR ONCE DAILY DX E11.9   pravastatin (PRAVACHOL) 40 MG tablet Take 1 tablet (40 mg total) by mouth daily.   Semaglutide (RYBELSUS) 14 MG TABS Take 1 tablet (14 mg total) by mouth daily. [place] Patient Assistance Program    12 system ROS was negative unless otherwise noted in HPI   Observations/Objective: CT angiogram from 11/26/2022 revealed string sign at the bifurcation of her common carotid artery.  She has normal internal carotid with normal caliber into the skull base.  Significant left carotid stenosis  Assessment and Plan: Critical right internal carotid artery stenosis, asymptomatic.  Recommended right carotid endarterectomy for reduction of stroke risk.  I again discussed in detail the procedure with expected 1 night hospitalization.  Also discussed the potential for 1-1 and half percent risk of stroke with surgery.  I did offer opportunity for the patient to meet with her [facility] prior to the day of surgery.  She  is comfortable meeting him on the morning of surgery.  Will coordinate this through our [facility] office  Follow Up Instructions:  Surgery at [facility]   I discussed the assessment and treatment plan with the patient. The patient was provided an opportunity to ask questions and all were answered. The patient agreed with the plan and demonstrated an understanding of the instructions.   The patient was advised to call back or seek an in-person evaluation if the symptoms worsen or if the  condition fails to improve as anticipated.  I spent 11-15 minutes with the patient via telephone encounter.   Annamary Rummage [facility] Office: [phone]  12/01/2022, 10:15 AM

## 2022-12-05 ENCOUNTER — Other Ambulatory Visit: Payer: Self-pay | Admitting: Family Medicine

## 2022-12-05 DIAGNOSIS — I152 Hypertension secondary to endocrine disorders: Secondary | ICD-10-CM

## 2022-12-06 ENCOUNTER — Encounter: Payer: Self-pay | Admitting: Family Medicine

## 2022-12-06 ENCOUNTER — Ambulatory Visit (INDEPENDENT_AMBULATORY_CARE_PROVIDER_SITE_OTHER): Payer: Medicare HMO | Admitting: Family Medicine

## 2022-12-06 VITALS — BP 148/88 | HR 100 | Temp 98.6°F | Ht 67.0 in | Wt 199.0 lb

## 2022-12-06 DIAGNOSIS — E1169 Type 2 diabetes mellitus with other specified complication: Secondary | ICD-10-CM | POA: Diagnosis not present

## 2022-12-06 DIAGNOSIS — E785 Hyperlipidemia, unspecified: Secondary | ICD-10-CM

## 2022-12-06 DIAGNOSIS — F411 Generalized anxiety disorder: Secondary | ICD-10-CM | POA: Diagnosis not present

## 2022-12-06 DIAGNOSIS — I152 Hypertension secondary to endocrine disorders: Secondary | ICD-10-CM

## 2022-12-06 DIAGNOSIS — I6521 Occlusion and stenosis of right carotid artery: Secondary | ICD-10-CM | POA: Diagnosis not present

## 2022-12-06 DIAGNOSIS — E1159 Type 2 diabetes mellitus with other circulatory complications: Secondary | ICD-10-CM | POA: Diagnosis not present

## 2022-12-06 LAB — POCT I-STAT CREATININE: Creatinine, Ser: 1.1 mg/dL — ABNORMAL HIGH (ref 0.44–1.00)

## 2022-12-06 LAB — BAYER DCA HB A1C WAIVED: HB A1C (BAYER DCA - WAIVED): 7.7 % — ABNORMAL HIGH (ref 4.8–5.6)

## 2022-12-06 MED ORDER — CYCLOBENZAPRINE HCL 5 MG PO TABS: 5.0000 mg | ORAL_TABLET | Freq: Every day | ORAL | 0 refills | Status: DC

## 2022-12-06 MED ORDER — METFORMIN HCL ER 500 MG PO TB24: 1000.0000 mg | ORAL_TABLET | Freq: Every day | ORAL | 3 refills | Status: DC

## 2022-12-06 MED ORDER — PRAVASTATIN SODIUM 40 MG PO TABS: 40.0000 mg | ORAL_TABLET | Freq: Every day | ORAL | 3 refills | Status: DC

## 2022-12-06 MED ORDER — BUSPIRONE HCL 15 MG PO TABS: 15.0000 mg | ORAL_TABLET | Freq: Three times a day (TID) | ORAL | 3 refills | Status: DC

## 2022-12-06 NOTE — Progress Notes (Signed)
Subjective: CC:DM PCP: [name], DO AVW:[id number] C Gusler Erica Evans is a 72 y.o. female presenting to clinic today for:  1. Type 2 Diabetes with hypertension, hyperlipidemia:  Reports compliance with Rybelsus 14 mg, Jardiance 25 mg, glipizide extended release 10 mg, metformin extended release 1000 mg daily, Pravachol 40 mg daily, Norvasc 10 mg daily, HCTZ 25 mg daily.  She admits that she did not follow a strict diet over the holidays.  She has been measuring blood sugars and typically they are running in the 150s over the last couple of weeks.  Last eye exam: Needs Last foot exam: needs Last A1c:  Lab Results  Component Value Date   HGBA1C 6.8 (H) 05/12/2022   Nephropathy screen indicated?: UTD Last flu, zoster and/or pneumovax:  Immunization History  Administered Date(s) Administered   Fluad Quad(high Dose 65+) 08/15/2019, 08/29/2020, 08/17/2021, 09/08/2022   Influenza Inj Mdck Quad Pf 09/01/2017   Influenza Split 09/21/2013   Influenza, High Dose Seasonal PF 08/24/2018   Influenza,inj,Quad PF,6+ Mos 09/19/2014, 09/03/2015, 08/04/2016   Influenza-Unspecified 08/30/2017   Moderna Covid-19 Vaccine Bivalent Booster 30yr & up 10/14/2021   Moderna SARS-COV2 Booster Vaccination 06/17/2021   Moderna Sars-Covid-2 Vaccination 01/09/2020, 02/06/2020, 10/01/2020   Pneumococcal Conjugate-13 10/20/2012, 10/17/2018   Pneumococcal Polysaccharide-23 10/20/2012   Tdap 07/06/2011    ROS: No chest pain, shortness of breath, vaginal symptoms since being treated with antibiotics.  No dizziness, LOC.   ROS: Per HPI  Allergies  Allergen Reactions   Lipitor [Atorvastatin]     Myopathy/fibromyalgia    Morphine And Related    Farxiga [Dapagliflozin]     UTI/CONSTIPATION TOLERATES JARDIANCE   Past Medical History:  Diagnosis Date   Diabetes mellitus without complication (HCC)    Hyperlipidemia    Hypertension    Palpitations    Vertigo     Current Outpatient Medications:     amLODipine (NORVASC) 10 MG tablet, TAKE 1 TABLET BY MOUTH EVERY DAY, Disp: 90 tablet, Rfl: 0   aspirin EC 81 MG tablet, Take 81 mg by mouth daily. Swallow whole., Disp: , Rfl:    blood glucose meter kit and supplies, Check BS once daily, Disp: 1 each, Rfl: 0   busPIRone (BUSPAR) 15 MG tablet, TAKE 1/2 TO 1 TABLET EVERY 8 HOURS FOR NERVES, Disp: 270 tablet, Rfl: 3   Cholecalciferol (D-3-5) 125 MCG (5000 UT) capsule, Take 5,000 Units by mouth daily., Disp: , Rfl:    cyclobenzaprine (FLEXERIL) 5 MG tablet, Take 5 mg by mouth at bedtime., Disp: , Rfl:    diclofenac (VOLTAREN) 75 MG EC tablet, Take 1 tablet (75 mg total) by mouth 2 (two) times daily as needed for moderate pain., Disp: 30 tablet, Rfl: 0   empagliflozin (JARDIANCE) 25 MG TABS tablet, Take 1 tablet (25 mg total) by mouth daily., Disp: 90 tablet, Rfl: 3   glipiZIDE (GLUCOTROL XL) 10 MG 24 hr tablet, Take 1 tablet (10 mg total) by mouth daily with breakfast., Disp: 90 tablet, Rfl: 1   hydrochlorothiazide (HYDRODIURIL) 25 MG tablet, Take 1 tablet (25 mg total) by mouth daily., Disp: 30 tablet, Rfl: 0   lidocaine (LIDODERM) 5 %, Place 1 patch onto the skin daily. Remove & Discard patch within 12 hours or as directed by MD, Disp: 30 patch, Rfl: 0   metFORMIN (GLUCOPHAGE-XR) 500 MG 24 hr tablet, TAKE 2 TABLETS (1,000 MG TOTAL) BY MOUTH DAILY WITH SUPPER, Disp: 180 tablet, Rfl: 1   methocarbamol (ROBAXIN) 500 MG tablet, TAKE 1 TABLET (  500 MG TOTAL) BY MOUTH 2 (TWO) TIMES DAILY AS NEEDED FOR MUSCLE SPASMS., Disp: 15 tablet, Rfl: 0   ONETOUCH VERIO test strip, USE TO TEST BLOOD SUGAR ONCE DAILY DX E11.9, Disp: 100 strip, Rfl: 3   pravastatin (PRAVACHOL) 40 MG tablet, Take 1 tablet (40 mg total) by mouth daily., Disp: 90 tablet, Rfl: 1   Semaglutide (RYBELSUS) 14 MG TABS, Take 1 tablet (14 mg total) by mouth daily. [place] Patient Assistance Program, Disp: 30 tablet, Rfl: 4 Social History   Socioeconomic History   Marital status: Married     Spouse name: Erica Evans   Number of children: 2   Years of education: Not on file   Highest education level: Associate degree: academic program  Occupational History   Occupation: Control and instrumentation engineer    Comment: retired  Tobacco Use   Smoking status: Former    Types: Cigarettes    Quit date: [place] since quitting: 34.0   Smokeless tobacco: Never  Vaping Use   Vaping Use: Never used  Substance and Sexual Activity   Alcohol use: No   Drug use: No   Sexual activity: Not on file  Other Topics Concern   Not on file  Social History Narrative   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 She lives at home with her husband Erica Evans and her grown son. She has 3 children. She enjoys bird watching and walking.    Social Determinants of Health   Financial Resource Strain: Low Risk  (02/18/2022)   Overall Financial Resource Strain (CARDIA)    Difficulty of Paying Living Expenses: Not hard at all  Food Insecurity: No Food Insecurity (02/18/2022)   Hunger Vital Sign    Worried About Running Out of Food in the Last Year: Never true    Ran Out of Food in the Last Year: Never true  Transportation Needs: No Transportation Needs (02/18/2022)   PRAPARE - Hydrologist (Medical): No    Lack of Transportation (Non-Medical): No  Physical Activity: Insufficiently Active (02/18/2022)   Exercise Vital Sign    Days of Exercise per Week: 7 days    Minutes of Exercise per Session: 20 min  Stress: No Stress Concern Present (02/18/2022)   [place]    Feeling of Stress : Only a little  Social Connections: Socially Integrated (02/18/2022)   Social Connection and Isolation Panel [NHANES]    Frequency of Communication with Friends and Family: More than three times a week    Frequency of Social Gatherings with Friends and Family: More than three times a week    Attends Religious Services: More than 4 times per year    Active  Member of [employer] or Organizations: Yes    Attends Music therapist: More than 4 times per year    Marital Status: Married  Human resources officer Violence: Not At Risk (02/18/2022)   Humiliation, Afraid, Rape, and Kick questionnaire    Fear of Current or Ex-Partner: No    Emotionally Abused: No    Physically Abused: No    Sexually Abused: No   Family History  Problem Relation Age of Onset   Coronary artery disease Mother    Diabetes Mother    Heart disease Mother    Coronary artery disease Father    Heart disease Father    Diabetes Sister    Hypertension Sister    Heart disease Sister  Diabetes Brother    Diabetes Sister    Arthritis Sister    Hypertension Sister    Diabetes Sister    Hypertension Sister    Hypertension Sister     Objective: Office vital signs reviewed. BP (!) 148/88   Pulse 100   Temp 98.6 F (37 C)   Ht '5\' 7"'$  (1.702 m)   Wt 199 lb (90.3 kg)   SpO2 93%   BMI 31.17 kg/m   Physical Examination:  General: Awake, alert, well nourished, No acute distress HEENT: sclera white, MMM Cardio: regular rate and rhythm, S1S2 heard, no murmurs appreciated Pulm: clear to auscultation bilaterally, no wheezes, rhonchi or rales; normal work of breathing on room air   Diabetic Foot Exam - Simple   Simple Foot Form Diabetic Foot exam was performed with the following findings: Yes 12/06/2022  3:27 PM  Visual Inspection See comments: Yes Sensation Testing Intact to touch and monofilament testing bilaterally: Yes Pulse Check Posterior Tibialis and Dorsalis pulse intact bilaterally: Yes Comments Mild hallucis valgus deformity noted.      Assessment/ Plan: 72 y.o. female   Type 2 diabetes mellitus with other specified complication, without long-term current use of insulin ([place]) - Plan: Bayer DCA Hb A1c Waived, metFORMIN (GLUCOPHAGE-XR) 500 MG 24 hr tablet  Hyperlipidemia associated with type 2 diabetes mellitus ([place]) - Plan: pravastatin (PRAVACHOL) 40  MG tablet  Hypertension associated with diabetes ([place])  Carotid stenosis, right  GAD (generalized anxiety disorder) - Plan: busPIRone (BUSPAR) 15 MG tablet  Sugar is not at goal with A1c up to 7.7 today.  She admitted to many areas of opportunity in her diet.  She will carb restricted and increase physical exercise for the next 3 months.  Will plan to follow-up in 3 months and recheck sugar and if remains uncontrolled we may need to consider advancing this Rybelsus to once weekly injectable.  For now she will continue all medications as directed, continue monitoring blood sugar closely.  Not yet due for fasting labs.  Continue statin  Has appointment for carotid surgery soon.  Again control blood pressure, blood sugar and cholesterol will be essential in preventing other cardiovascular complications  Orders Placed This Encounter  Procedures   Bayer DCA Hb A1c Waived   No orders of the defined types were placed in this encounter.    [name], DO [place] [phone]

## 2022-12-06 NOTE — Patient Instructions (Signed)
Plan for fasting labs with next visit, which will be your annual exam. Get diabetic eye exam done. Good luck with your surgery next month!

## 2022-12-09 ENCOUNTER — Other Ambulatory Visit: Payer: Self-pay | Admitting: Family Medicine

## 2022-12-09 DIAGNOSIS — I152 Hypertension secondary to endocrine disorders: Secondary | ICD-10-CM

## 2022-12-10 NOTE — Progress Notes (Signed)
Surgical Instructions    Your procedure is scheduled on Thursday, December 16, 2022.  Report to [facility] Main Entrance "A" at 5:30 A.M., then check in with the Admitting office.  Call this number if you have problems the morning of surgery:  [phone]   If you have any questions prior to your surgery date call [phone]: Open Monday-Friday 8am-4pm If you experience any cold or flu symptoms such as cough, fever, chills, shortness of breath, etc. between now and your scheduled surgery, please notify us at the above number     Remember:  Do not eat or drink after midnight the night before your surgery   Take these medicines the morning of surgery with A SIP OF WATER:  amLODipine (NORVASC)   As needed: busPIRone (BUSPAR)   As of today, STOP taking Aleve, Naproxen, Ibuprofen, Motrin, Advil, Goody's, BC's, all herbal medications, fish oil, and all vitamins.   WHAT DO I DO ABOUT MY DIABETES MEDICATION?   Please STOP Jardiance 72 hours prior to your surgery.  Last dose will be December 12, 2022  Please STOP Rybelsus 24 hours prior to your surgery.  Last dose will be December 14, 2022   Do not take oral diabetes medicines (pills) the morning of surgery.    THE MORNING OF SURGERY, do NOT take Metformin, do NOT take Glipizide   The day of surgery, do not take other diabetes injectables, including Byetta (exenatide), Bydureon (exenatide ER), Victoza (liraglutide), or Trulicity (dulaglutide).  If your CBG is greater than 220 mg/dL, you may take  of your sliding scale (correction) dose of insulin.   HOW TO MANAGE YOUR DIABETES BEFORE AND AFTER SURGERY  Why is it important to control my blood sugar before and after surgery? Improving blood sugar levels before and after surgery helps healing and can limit problems. A way of improving blood sugar control is eating a healthy diet by:  Eating less sugar and carbohydrates  Increasing activity/exercise  Talking with your doctor  about reaching your blood sugar goals High blood sugars (greater than 180 mg/dL) can raise your risk of infections and slow your recovery, so you will need to focus on controlling your diabetes during the weeks before surgery. Make sure that the doctor who takes care of your diabetes knows about your planned surgery including the date and location.  How do I manage my blood sugar before surgery? Check your blood sugar at least 4 times a day, starting 2 days before surgery, to make sure that the level is not too high or low.  Check your blood sugar the morning of your surgery when you wake up and every 2 hours until you get to the Short Stay unit.  If your blood sugar is less than 70 mg/dL, you will need to treat for low blood sugar: Do not take insulin. Treat a low blood sugar (less than 70 mg/dL) with  cup of clear juice (cranberry or apple), 4 glucose tablets, OR glucose gel. Recheck blood sugar in 15 minutes after treatment (to make sure it is greater than 70 mg/dL). If your blood sugar is not greater than 70 mg/dL on recheck, call [phone] for further instructions. Report your blood sugar to the short stay nurse when you get to Short Stay.  If you are admitted to the hospital after surgery: Your blood sugar will be checked by the staff and you will probably be given insulin after surgery (instead of oral diabetes medicines) to make sure you have good blood  sugar levels. The goal for blood sugar control after surgery is 80-180 mg/dL.            Do not wear jewelry or makeup. Do not wear lotions, powders, cologne or deodorant. Do not shave 48 hours prior to surgery. Do not bring valuables to the hospital. Do not wear nail polish, gel polish, artificial nails, or any other type of covering on natural nails (fingers and toes) If you have artificial nails or gel coating that need to be removed by a nail salon, please have this removed prior to surgery. Artificial nails or gel coating  may interfere with anesthesia's ability to adequately monitor your vital signs.  [facility] is not responsible for any belongings or valuables.    Do NOT Smoke (Tobacco/Vaping)  24 hours prior to your procedure  If you use a CPAP at night, you may bring your mask for your overnight stay.   Contacts, glasses, hearing aids, dentures or partials may not be worn into surgery, please bring cases for these belongings   For patients admitted to the hospital, discharge time will be determined by your treatment team.   Patients discharged the day of surgery will not be allowed to drive home, and someone needs to stay with them for 24 hours.   SURGICAL WAITING ROOM VISITATION Patients having surgery or a procedure may have no more than 2 support people in the waiting area - these visitors may rotate.   Children under the age of 52 must have an adult with them who is not the patient. If the patient needs to stay at the hospital during part of their recovery, the visitor guidelines for inpatient rooms apply. Pre-op nurse will coordinate an appropriate time for 1 support person to accompany patient in pre-op.  This support person may not rotate.   Please refer to [email or web address] for the visitor guidelines for Inpatients (after your surgery is over and you are in a regular room).    Special instructions:    Oral Hygiene is also important to reduce your risk of infection.  Remember - BRUSH YOUR TEETH THE MORNING OF SURGERY WITH YOUR REGULAR TOOTHPASTE   [facility]- Preparing For Surgery  Before surgery, you can play an important role. Because skin is not sterile, your skin needs to be as free of germs as possible. You can reduce the number of germs on your skin by washing with CHG (chlorahexidine gluconate) Soap before surgery.  CHG is an antiseptic cleaner which kills germs and bonds with the skin to continue killing germs even after washing.      Please do not use if you have an allergy to CHG or antibacterial soaps. If your skin becomes reddened/irritated stop using the CHG.  Do not shave (including legs and underarms) for at least 48 hours prior to first CHG shower. It is OK to shave your face.  Please follow these instructions carefully.     Shower the NIGHT BEFORE SURGERY and the MORNING OF SURGERY with CHG Soap.   If you chose to wash your hair, wash your hair first as usual with your normal shampoo. After you shampoo, rinse your hair and body thoroughly to remove the shampoo.  Then [employer] and genitals (private parts) with your normal soap and rinse thoroughly to remove soap.  After that Use CHG Soap as you would any other liquid soap. You can apply CHG directly to the skin and wash gently with a scrungie or a clean washcloth.  Apply the CHG Soap to your body ONLY FROM THE NECK DOWN.  Do not use on open wounds or open sores. Avoid contact with your eyes, ears, mouth and genitals (private parts). Wash Face and genitals (private parts)  with your normal soap.   Wash thoroughly, paying special attention to the area where your surgery will be performed.  Thoroughly rinse your body with warm water from the neck down.  DO NOT shower/wash with your normal soap after using and rinsing off the CHG Soap.  Pat yourself dry with a CLEAN TOWEL.  Wear CLEAN PAJAMAS to bed the night before surgery  Place CLEAN SHEETS on your bed the night before your surgery  DO NOT SLEEP WITH PETS.   Day of Surgery: Take a shower with CHG soap. Wear Clean/Comfortable clothing the morning of surgery Do not apply any deodorants/lotions.   Remember to brush your teeth WITH YOUR REGULAR TOOTHPASTE.    If you received a COVID test during your pre-op visit, it is requested that you wear a mask when out in public, stay away from anyone that may not be feeling well, and notify your surgeon if you develop symptoms. If you have been in contact with  anyone that has tested positive in the last 10 days, please notify your surgeon.    Please read over the following fact sheets that you were given.

## 2022-12-10 NOTE — Progress Notes (Signed)
Surgical Instructions    Your procedure is scheduled on Thursday, December 16, 2022.  Report to [facility] Main Entrance "A" at 5:30 A.M., then check in with the Admitting office.  Call this number if you have problems the morning of surgery:  [phone]   If you have any questions prior to your surgery date call [phone]: Open Monday-Friday 8am-4pm If you experience any cold or flu symptoms such as cough, fever, chills, shortness of breath, etc. between now and your scheduled surgery, please notify us at the above number     Remember:  Do not eat or drink after midnight the night before your surgery   Take these medicines the morning of surgery with A SIP OF WATER:  amLODipine (NORVASC)  ASPIRIN EC    As needed: busPIRone (BUSPAR)    As of today, STOP taking Aleve, Naproxen, Ibuprofen, Motrin, Advil, Goody's, BC's, all herbal medications, fish oil, and all vitamins.   WHAT DO I DO ABOUT MY DIABETES MEDICATION?   Please STOP Jardiance 72 hours prior to your surgery.  Last dose will be December 12, 2022  Please STOP Rybelsus 24 hours prior to your surgery.  Last dose will be December 13, 2022   Do not take oral diabetes medicines (pills) the morning of surgery.    THE MORNING OF SURGERY, do NOT take Metformin, do NOT take Glipizide   The day of surgery, do not take other diabetes injectables, including Byetta (exenatide), Bydureon (exenatide ER), Victoza (liraglutide), or Trulicity (dulaglutide).  If your CBG is greater than 220 mg/dL, you may take  of your sliding scale (correction) dose of insulin.   HOW TO MANAGE YOUR DIABETES BEFORE AND AFTER SURGERY  Why is it important to control my blood sugar before and after surgery? Improving blood sugar levels before and after surgery helps healing and can limit problems. A way of improving blood sugar control is eating a healthy diet by:  Eating less sugar and carbohydrates  Increasing activity/exercise  Talking  with your doctor about reaching your blood sugar goals High blood sugars (greater than 180 mg/dL) can raise your risk of infections and slow your recovery, so you will need to focus on controlling your diabetes during the weeks before surgery. Make sure that the doctor who takes care of your diabetes knows about your planned surgery including the date and location.  How do I manage my blood sugar before surgery? Check your blood sugar at least 4 times a day, starting 2 days before surgery, to make sure that the level is not too high or low.  Check your blood sugar the morning of your surgery when you wake up and every 2 hours until you get to the Short Stay unit.  If your blood sugar is less than 70 mg/dL, you will need to treat for low blood sugar: Do not take insulin. Treat a low blood sugar (less than 70 mg/dL) with  cup of clear juice (cranberry or apple), 4 glucose tablets, OR glucose gel. Recheck blood sugar in 15 minutes after treatment (to make sure it is greater than 70 mg/dL). If your blood sugar is not greater than 70 mg/dL on recheck, call [phone] for further instructions. Report your blood sugar to the short stay nurse when you get to Short Stay.  If you are admitted to the hospital after surgery: Your blood sugar will be checked by the staff and you will probably be given insulin after surgery (instead of oral diabetes medicines) to make  sure you have good blood sugar levels. The goal for blood sugar control after surgery is 80-180 mg/dL.            Do not wear jewelry or makeup. Do not wear lotions, powders, cologne or deodorant. Do not shave 48 hours prior to surgery. Do not bring valuables to the hospital. Do not wear nail polish, gel polish, artificial nails, or any other type of covering on natural nails (fingers and toes) If you have artificial nails or gel coating that need to be removed by a nail salon, please have this removed prior to surgery. Artificial nails  or gel coating may interfere with anesthesia's ability to adequately monitor your vital signs.  [place] [facility] is not responsible for any belongings or valuables.    Do NOT Smoke (Tobacco/Vaping)  24 hours prior to your procedure  If you use a CPAP at night, you may bring your mask for your overnight stay.   Contacts, glasses, hearing aids, dentures or partials may not be worn into surgery, please bring cases for these belongings   For patients admitted to the hospital, discharge time will be determined by your treatment team.   Patients discharged the day of surgery will not be allowed to drive home, and someone needs to stay with them for 24 hours.   SURGICAL WAITING ROOM VISITATION Patients having surgery or a procedure may have no more than 2 support people in the waiting area - these visitors may rotate.   Children under the age of 37 must have an adult with them who is not the patient. If the patient needs to stay at the hospital during part of their recovery, the visitor guidelines for inpatient rooms apply. Pre-op nurse will coordinate an appropriate time for 1 support person to accompany patient in pre-op.  This support person may not rotate.   Please refer to [email or web address] for the visitor guidelines for Inpatients (after your surgery is over and you are in a regular room).    Special instructions:    Oral Hygiene is also important to reduce your risk of infection.  Remember - BRUSH YOUR TEETH THE MORNING OF SURGERY WITH YOUR REGULAR TOOTHPASTE   [place]- Preparing For Surgery  Before surgery, you can play an important role. Because skin is not sterile, your skin needs to be as free of germs as possible. You can reduce the number of germs on your skin by washing with CHG (chlorahexidine gluconate) Soap before surgery.  CHG is an antiseptic cleaner which kills germs and bonds with the skin to continue killing germs even  after washing.     Please do not use if you have an allergy to CHG or antibacterial soaps. If your skin becomes reddened/irritated stop using the CHG.  Do not shave (including legs and underarms) for at least 48 hours prior to first CHG shower. It is OK to shave your face.  Please follow these instructions carefully.     Shower the NIGHT BEFORE SURGERY and the MORNING OF SURGERY with CHG Soap.   If you chose to wash your hair, wash your hair first as usual with your normal shampoo. After you shampoo, rinse your hair and body thoroughly to remove the shampoo.  Then [employer] and genitals (private parts) with your normal soap and rinse thoroughly to remove soap.  After that Use CHG Soap as you would any other liquid soap. You can apply CHG directly to the skin and wash gently with a scrungie or  a clean washcloth.   Apply the CHG Soap to your body ONLY FROM THE NECK DOWN.  Do not use on open wounds or open sores. Avoid contact with your eyes, ears, mouth and genitals (private parts). Wash Face and genitals (private parts)  with your normal soap.   Wash thoroughly, paying special attention to the area where your surgery will be performed.  Thoroughly rinse your body with warm water from the neck down.  DO NOT shower/wash with your normal soap after using and rinsing off the CHG Soap.  Pat yourself dry with a CLEAN TOWEL.  Wear CLEAN PAJAMAS to bed the night before surgery  Place CLEAN SHEETS on your bed the night before your surgery  DO NOT SLEEP WITH PETS.   Day of Surgery:  Take a shower with CHG soap. Wear Clean/Comfortable clothing the morning of surgery Do not apply any deodorants/lotions.   Remember to brush your teeth WITH YOUR REGULAR TOOTHPASTE.    If you received a COVID test during your pre-op visit, it is requested that you wear a mask when out in public, stay away from anyone that may not be feeling well, and notify your surgeon if you develop symptoms. If you have  been in contact with anyone that has tested positive in the last 10 days, please notify your surgeon.    Please read over the following fact sheets that you were given.

## 2022-12-13 ENCOUNTER — Encounter (HOSPITAL_COMMUNITY): Payer: Self-pay

## 2022-12-13 ENCOUNTER — Encounter (HOSPITAL_COMMUNITY)
Admission: RE | Admit: 2022-12-13 | Discharge: 2022-12-13 | Disposition: A | Discharge status: Home / Self Care | Payer: Medicare HMO | Source: Ambulatory Visit | Attending: Vascular Surgery | Admitting: Vascular Surgery

## 2022-12-13 ENCOUNTER — Other Ambulatory Visit: Payer: Self-pay

## 2022-12-13 VITALS — BP 143/77 | HR 97 | Temp 97.7°F | Resp 18 | Ht 67.0 in | Wt 198.7 lb

## 2022-12-13 DIAGNOSIS — I6521 Occlusion and stenosis of right carotid artery: Secondary | ICD-10-CM | POA: Insufficient documentation

## 2022-12-13 DIAGNOSIS — Z01818 Encounter for other preprocedural examination: Secondary | ICD-10-CM | POA: Insufficient documentation

## 2022-12-13 DIAGNOSIS — I491 Atrial premature depolarization: Secondary | ICD-10-CM | POA: Insufficient documentation

## 2022-12-13 DIAGNOSIS — E119 Type 2 diabetes mellitus without complications: Secondary | ICD-10-CM

## 2022-12-13 DIAGNOSIS — I4510 Unspecified right bundle-branch block: Secondary | ICD-10-CM | POA: Insufficient documentation

## 2022-12-13 DIAGNOSIS — I10 Essential (primary) hypertension: Secondary | ICD-10-CM

## 2022-12-13 HISTORY — DX: Gastro-esophageal reflux disease without esophagitis: K21.9

## 2022-12-13 HISTORY — DX: Unspecified osteoarthritis, unspecified site: M19.90

## 2022-12-13 LAB — TYPE AND SCREEN
ABO/RH(D): A POS
Antibody Screen: NEGATIVE

## 2022-12-13 LAB — COMPREHENSIVE METABOLIC PANEL
ALT: 15 U/L (ref 0–44)
AST: 26 U/L (ref 15–41)
Albumin: 4 g/dL (ref 3.5–5.0)
Alkaline Phosphatase: 71 U/L (ref 38–126)
Anion gap: 16 — ABNORMAL HIGH (ref 5–15)
BUN: 13 mg/dL (ref 8–23)
CO2: 24 mmol/L (ref 22–32)
Calcium: 9.6 mg/dL (ref 8.9–10.3)
Chloride: 96 mmol/L — ABNORMAL LOW (ref 98–111)
Creatinine, Ser: 1.01 mg/dL — ABNORMAL HIGH (ref 0.44–1.00)
GFR, Estimated: 60 mL/min — ABNORMAL LOW (ref >60)
Glucose, Bld: 140 mg/dL — ABNORMAL HIGH (ref 70–99)
Potassium: 4 mmol/L (ref 3.5–5.1)
Sodium: 136 mmol/L (ref 135–145)
Total Bilirubin: 0.4 mg/dL (ref 0.3–1.2)
Total Protein: 7.5 g/dL (ref 6.5–8.1)

## 2022-12-13 LAB — SURGICAL PCR SCREEN
MRSA, PCR: NEGATIVE
Staphylococcus aureus: NEGATIVE

## 2022-12-13 LAB — URINALYSIS, ROUTINE W REFLEX MICROSCOPIC
Bacteria, UA: NONE SEEN
Bilirubin Urine: NEGATIVE
Glucose, UA: >=500 mg/dL — AB
Hgb urine dipstick: NEGATIVE
Ketones, ur: NEGATIVE mg/dL
Nitrite: NEGATIVE
Protein, ur: NEGATIVE mg/dL
Specific Gravity, Urine: 1.024 (ref 1.005–1.030)
pH: 6 (ref 5.0–8.0)

## 2022-12-13 LAB — CBC
HCT: 46.1 % — ABNORMAL HIGH (ref 36.0–46.0)
Hemoglobin: 14 g/dL (ref 12.0–15.0)
MCH: 23.4 pg — ABNORMAL LOW (ref 26.0–34.0)
MCHC: 30.4 g/dL (ref 30.0–36.0)
MCV: 77.1 fL — ABNORMAL LOW (ref 80.0–100.0)
Platelets: 533 10*3/uL — ABNORMAL HIGH (ref 150–400)
RBC: 5.98 MIL/uL — ABNORMAL HIGH (ref 3.87–5.11)
RDW: 16 % — ABNORMAL HIGH (ref 11.5–15.5)
WBC: 11.1 10*3/uL — ABNORMAL HIGH (ref 4.0–10.5)
nRBC: 0 % (ref 0.0–0.2)

## 2022-12-13 LAB — GLUCOSE, CAPILLARY: Glucose-Capillary: 177 mg/dL — ABNORMAL HIGH (ref 70–99)

## 2022-12-13 LAB — APTT: aPTT: 27 seconds (ref 24–36)

## 2022-12-13 LAB — PROTIME-INR
INR: 0.9 (ref 0.8–1.2)
Prothrombin Time: 12.2 seconds (ref 11.4–15.2)

## 2022-12-13 NOTE — Progress Notes (Signed)
PCP - Ronnie Doss, DO Cardiologist - denies  PPM/ICD - n/a  Chest x-ray - n/a EKG - 12/13/22 Stress Test - 2014 ECHO - 2014 Cardiac Cath - denies   Sleep Study - denies CPAP - denies  Fasting Blood Sugar - 135-149 Checks Blood Sugar once a day Last A1C 7.7 on 12/06/22  Last dose of GLP1 agonist-  Reybelsus; last dose will be 12/14/22.  GLP1 instructions: Hold 24 hours prior to surgery.  Blood Thinner Instructions: n/a Aspirin Instructions: n/a  NPO at MD  COVID TEST- n/a  Anesthesia review: Yes, EKG review.   Patient denies shortness of breath, fever, cough and chest pain at PAT appointment   All instructions explained to the patient, with a verbal understanding of the material. Patient agrees to go over the instructions while at home for a better understanding. Patient also instructed to self quarantine after being tested for COVID-19. The opportunity to ask questions was provided.

## 2022-12-13 NOTE — Progress Notes (Signed)
Surgical Instructions    Your procedure is scheduled on Thursday, December 16, 2022.  Report to [facility] Main Entrance "A" at 5:30 A.M., then check in with the Admitting office.  Call this number if you have problems the morning of surgery:  [phone]   If you have any questions prior to your surgery date call [phone]: Open Monday-Friday 8am-4pm If you experience any cold or flu symptoms such as cough, fever, chills, shortness of breath, etc. between now and your scheduled surgery, please notify us at the above number     Remember:  Do not eat or drink after midnight the night before your surgery   Take these medicines the morning of surgery with A SIP OF WATER:  amLODipine (NORVASC)   As needed: busPIRone (BUSPAR)   As of today, STOP taking Aleve, Naproxen, Ibuprofen, Motrin, Advil, Goody's, BC's, all herbal medications, fish oil, and all vitamins.   WHAT DO I DO ABOUT MY DIABETES MEDICATION?   Please STOP Jardiance 72 hours prior to your surgery.  Last dose will be December 12, 2022  Please STOP Rybelsus 24 hours prior to your surgery.  Last dose will be December 14, 2022  THE MORNING OF SURGERY, do NOT take Metformin, do NOT take Glipizide.    HOW TO MANAGE YOUR DIABETES BEFORE AND AFTER SURGERY  Why is it important to control my blood sugar before and after surgery? Improving blood sugar levels before and after surgery helps healing and can limit problems. A way of improving blood sugar control is eating a healthy diet by:  Eating less sugar and carbohydrates  Increasing activity/exercise  Talking with your doctor about reaching your blood sugar goals High blood sugars (greater than 180 mg/dL) can raise your risk of infections and slow your recovery, so you will need to focus on controlling your diabetes during the weeks before surgery. Make sure that the doctor who takes care of your diabetes knows about your planned surgery including the date and  location.  How do I manage my blood sugar before surgery? Check your blood sugar at least 4 times a day, starting 2 days before surgery, to make sure that the level is not too high or low.  Check your blood sugar the morning of your surgery when you wake up and every 2 hours until you get to the Short Stay unit.  If your blood sugar is less than 70 mg/dL, you will need to treat for low blood sugar: Do not take insulin. Treat a low blood sugar (less than 70 mg/dL) with  cup of clear juice (cranberry or apple), 4 glucose tablets, OR glucose gel. Recheck blood sugar in 15 minutes after treatment (to make sure it is greater than 70 mg/dL). If your blood sugar is not greater than 70 mg/dL on recheck, call [phone] for further instructions. Report your blood sugar to the short stay nurse when you get to Short Stay.  If you are admitted to the hospital after surgery: Your blood sugar will be checked by the staff and you will probably be given insulin after surgery (instead of oral diabetes medicines) to make sure you have good blood sugar levels. The goal for blood sugar control after surgery is 80-180 mg/dL.            Do not wear jewelry or makeup. Do not wear lotions, powders, cologne or deodorant. Do not shave 48 hours prior to surgery. Do not bring valuables to the hospital. Do not wear nail polish,  gel polish, artificial nails, or any other type of covering on natural nails (fingers and toes) If you have artificial nails or gel coating that need to be removed by a nail salon, please have this removed prior to surgery. Artificial nails or gel coating may interfere with anesthesia's ability to adequately monitor your vital signs.  [place] is not responsible for any belongings or valuables.    Do NOT Smoke (Tobacco/Vaping)  24 hours prior to your procedure  If you use a CPAP at night, you may bring your mask for your overnight stay.   Contacts, glasses, hearing aids, dentures or  partials may not be worn into surgery, please bring cases for these belongings   For patients admitted to the hospital, discharge time will be determined by your treatment team.   Patients discharged the day of surgery will not be allowed to drive home, and someone needs to stay with them for 24 hours.   SURGICAL WAITING ROOM VISITATION Patients having surgery or a procedure may have no more than 2 support people in the waiting area - these visitors may rotate.   Children under the age of 43 must have an adult with them who is not the patient. If the patient needs to stay at the hospital during part of their recovery, the visitor guidelines for inpatient rooms apply. Pre-op nurse will coordinate an appropriate time for 1 support person to accompany patient in pre-op.  This support person may not rotate.   Please refer to [email or web address] for the visitor guidelines for Inpatients (after your surgery is over and you are in a regular room).    Special instructions:    Oral Hygiene is also important to reduce your risk of infection.  Remember - BRUSH YOUR TEETH THE MORNING OF SURGERY WITH YOUR REGULAR TOOTHPASTE   [place]- Preparing For Surgery  Before surgery, you can play an important role. Because skin is not sterile, your skin needs to be as free of germs as possible. You can reduce the number of germs on your skin by washing with CHG (chlorahexidine gluconate) Soap before surgery.  CHG is an antiseptic cleaner which kills germs and bonds with the skin to continue killing germs even after washing.     Please do not use if you have an allergy to CHG or antibacterial soaps. If your skin becomes reddened/irritated stop using the CHG.  Do not shave (including legs and underarms) for at least 48 hours prior to first CHG shower. It is OK to shave your face.  Please follow these instructions carefully.     Shower the NIGHT BEFORE  SURGERY and the MORNING OF SURGERY with CHG Soap.   If you chose to wash your hair, wash your hair first as usual with your normal shampoo. After you shampoo, rinse your hair and body thoroughly to remove the shampoo.  Then [employer] and genitals (private parts) with your normal soap and rinse thoroughly to remove soap.  After that Use CHG Soap as you would any other liquid soap. You can apply CHG directly to the skin and wash gently with a scrungie or a clean washcloth.   Apply the CHG Soap to your body ONLY FROM THE NECK DOWN.  Do not use on open wounds or open sores. Avoid contact with your eyes, ears, mouth and genitals (private parts). Wash Face and genitals (private parts)  with your normal soap.   Wash thoroughly, paying special attention to the area where your surgery will  be performed.  Thoroughly rinse your body with warm water from the neck down.  DO NOT shower/wash with your normal soap after using and rinsing off the CHG Soap.  Pat yourself dry with a CLEAN TOWEL.  Wear CLEAN PAJAMAS to bed the night before surgery  Place CLEAN SHEETS on your bed the night before your surgery  DO NOT SLEEP WITH PETS.   Day of Surgery: Take a shower with CHG soap. Wear Clean/Comfortable clothing the morning of surgery Do not apply any deodorants/lotions.   Remember to brush your teeth WITH YOUR REGULAR TOOTHPASTE.    If you received a COVID test during your pre-op visit, it is requested that you wear a mask when out in public, stay away from anyone that may not be feeling well, and notify your surgeon if you develop symptoms. If you have been in contact with anyone that has tested positive in the last 10 days, please notify your surgeon.    Please read over the following fact sheets that you were given.

## 2022-12-14 ENCOUNTER — Other Ambulatory Visit: Payer: Self-pay | Admitting: Vascular Surgery

## 2022-12-14 MED ORDER — CLOPIDOGREL BISULFATE 75 MG PO TABS: 75.0000 mg | ORAL_TABLET | Freq: Every day | ORAL | 6 refills | Status: DC

## 2022-12-15 NOTE — Anesthesia Preprocedure Evaluation (Signed)
Anesthesia Evaluation  Patient identified by MRN, date of birth, ID band Patient awake    Reviewed: Allergy & Precautions, NPO status , Patient's Chart, lab work & pertinent test results  History of Anesthesia Complications Negative for: history of anesthetic complications  Airway Mallampati: II  TM Distance: >3 FB Neck ROM: Full    Dental  (+) Edentulous Upper, Missing,    Pulmonary former smoker   Pulmonary exam normal        Cardiovascular hypertension, Pt. on medications + Cardiac Stents  Normal cardiovascular exam  right carotid stenosis   Neuro/Psych negative neurological ROS  negative psych ROS   GI/Hepatic Neg liver ROS,GERD  Controlled,,  Endo/Other  diabetes (on Rybelsus QD), Type 2, Oral Hypoglycemic Agents    Renal/GU negative Renal ROS  negative genitourinary   Musculoskeletal  (+) Arthritis ,    Abdominal   Peds  Hematology negative hematology ROS (+)   Anesthesia Other Findings Day of surgery medications reviewed with patient.  Reproductive/Obstetrics negative OB ROS                              Anesthesia Physical Anesthesia Plan  ASA: 3  Anesthesia Plan: General   Post-op Pain Management: Tylenol PO (pre-op)*   Induction: Intravenous  PONV Risk Score and Plan: 3 and Treatment may vary due to age or medical condition, Dexamethasone and Ondansetron  Airway Management Planned: Oral ETT  Additional Equipment: Arterial line  Intra-op Plan:   Post-operative Plan: Extubation in OR  Informed Consent: I have reviewed the patients History and Physical, chart, labs and discussed the procedure including the risks, benefits and alternatives for the proposed anesthesia with the patient or authorized representative who has indicated his/her understanding and acceptance.     Dental advisory given  Plan Discussed with: CRNA  Anesthesia Plan Comments:           Anesthesia Quick Evaluation

## 2022-12-16 ENCOUNTER — Other Ambulatory Visit: Payer: Self-pay | Admitting: Family Medicine

## 2022-12-16 ENCOUNTER — Encounter (HOSPITAL_COMMUNITY): Payer: Self-pay | Admitting: Vascular Surgery

## 2022-12-16 ENCOUNTER — Inpatient Hospital Stay (HOSPITAL_COMMUNITY): Payer: Medicare HMO | Admitting: Physician Assistant

## 2022-12-16 ENCOUNTER — Other Ambulatory Visit: Payer: Self-pay

## 2022-12-16 ENCOUNTER — Inpatient Hospital Stay (HOSPITAL_COMMUNITY)
Admission: RE | Admit: 2022-12-16 | Discharge: 2022-12-17 | DRG: 038 | Disposition: A | Discharge status: Home / Self Care | Payer: Medicare HMO | Attending: Vascular Surgery | Admitting: Vascular Surgery

## 2022-12-16 ENCOUNTER — Encounter (HOSPITAL_COMMUNITY)
Admission: RE | Disposition: A | Discharge status: Home / Self Care | Payer: Self-pay | Source: Ambulatory Visit | Attending: Vascular Surgery

## 2022-12-16 DIAGNOSIS — Z7984 Long term (current) use of oral hypoglycemic drugs: Secondary | ICD-10-CM | POA: Diagnosis not present

## 2022-12-16 DIAGNOSIS — K219 Gastro-esophageal reflux disease without esophagitis: Secondary | ICD-10-CM | POA: Diagnosis present

## 2022-12-16 DIAGNOSIS — D62 Acute posthemorrhagic anemia: Secondary | ICD-10-CM | POA: Diagnosis not present

## 2022-12-16 DIAGNOSIS — E1165 Type 2 diabetes mellitus with hyperglycemia: Secondary | ICD-10-CM

## 2022-12-16 DIAGNOSIS — I10 Essential (primary) hypertension: Secondary | ICD-10-CM | POA: Diagnosis not present

## 2022-12-16 DIAGNOSIS — Z87891 Personal history of nicotine dependence: Secondary | ICD-10-CM | POA: Diagnosis not present

## 2022-12-16 DIAGNOSIS — I6521 Occlusion and stenosis of right carotid artery: Secondary | ICD-10-CM

## 2022-12-16 DIAGNOSIS — Z8616 Personal history of COVID-19: Secondary | ICD-10-CM

## 2022-12-16 DIAGNOSIS — E119 Type 2 diabetes mellitus without complications: Secondary | ICD-10-CM

## 2022-12-16 DIAGNOSIS — E785 Hyperlipidemia, unspecified: Secondary | ICD-10-CM | POA: Diagnosis present

## 2022-12-16 DIAGNOSIS — Z79899 Other long term (current) drug therapy: Secondary | ICD-10-CM

## 2022-12-16 HISTORY — PX: ENDARTERECTOMY: SHX5162

## 2022-12-16 LAB — GLUCOSE, CAPILLARY
Glucose-Capillary: 239 mg/dL — ABNORMAL HIGH (ref 70–99)
Glucose-Capillary: 130 mg/dL — ABNORMAL HIGH (ref 70–99)
Glucose-Capillary: 244 mg/dL — ABNORMAL HIGH (ref 70–99)
Glucose-Capillary: 177 mg/dL — ABNORMAL HIGH (ref 70–99)

## 2022-12-16 LAB — ABO/RH: ABO/RH(D): A POS

## 2022-12-16 SURGERY — ENDARTERECTOMY, CAROTID
Anesthesia: General | Site: Neck | Laterality: Right

## 2022-12-16 MED ORDER — ONDANSETRON HCL 4 MG/2ML IJ SOLN
INTRAMUSCULAR | Status: AC
  Filled 2022-12-16: qty 2

## 2022-12-16 MED ORDER — HEPARIN SODIUM (PORCINE) 1000 UNIT/ML IJ SOLN
INTRAMUSCULAR | Status: AC
  Filled 2022-12-16: qty 10

## 2022-12-16 MED ORDER — CLOPIDOGREL BISULFATE 75 MG PO TABS
75.0000 mg | ORAL_TABLET | Freq: Every day | ORAL | Status: AC
  Administered 2022-12-16: 75 mg via ORAL

## 2022-12-16 MED ORDER — ESMOLOL HCL 100 MG/10ML IV SOLN
INTRAVENOUS | Status: AC
  Filled 2022-12-16: qty 10

## 2022-12-16 MED ORDER — ONDANSETRON HCL 4 MG/2ML IJ SOLN
INTRAMUSCULAR | Status: DC | PRN
  Administered 2022-12-16: 4 mg via INTRAVENOUS

## 2022-12-16 MED ORDER — FENTANYL CITRATE (PF) 250 MCG/5ML IJ SOLN
INTRAMUSCULAR | Status: DC | PRN
  Administered 2022-12-16: 50 ug via INTRAVENOUS
  Administered 2022-12-16: 100 ug via INTRAVENOUS

## 2022-12-16 MED ORDER — ALBUMIN HUMAN 5 % IV SOLN
INTRAVENOUS | Status: AC
  Filled 2022-12-16: qty 250

## 2022-12-16 MED ORDER — ROCURONIUM BROMIDE 10 MG/ML (PF) SYRINGE
PREFILLED_SYRINGE | INTRAVENOUS | Status: DC | PRN
  Administered 2022-12-16: 10 mg via INTRAVENOUS
  Administered 2022-12-16: 70 mg via INTRAVENOUS

## 2022-12-16 MED ORDER — CLOPIDOGREL BISULFATE 75 MG PO TABS
ORAL_TABLET | ORAL | Status: AC
  Filled 2022-12-16: qty 1

## 2022-12-16 MED ORDER — OXYCODONE HCL 5 MG PO TABS: 5.0000 mg | ORAL_TABLET | Freq: Once | ORAL | Status: DC | PRN

## 2022-12-16 MED ORDER — SODIUM CHLORIDE 0.9 % IV SOLN: 500.0000 mL | Freq: Once | INTRAVENOUS | Status: DC | PRN

## 2022-12-16 MED ORDER — EMPAGLIFLOZIN 25 MG PO TABS
25.0000 mg | ORAL_TABLET | Freq: Every day | ORAL | Status: DC
  Administered 2022-12-16 – 2022-12-17 (×2): 25 mg via ORAL
  Filled 2022-12-16 (×2): qty 1

## 2022-12-16 MED ORDER — CEFAZOLIN SODIUM-DEXTROSE 2-4 GM/100ML-% IV SOLN
2.0000 g | Freq: Three times a day (TID) | INTRAVENOUS | Status: AC
  Administered 2022-12-16 (×2): 2 g via INTRAVENOUS
  Filled 2022-12-16 (×2): qty 100

## 2022-12-16 MED ORDER — PANTOPRAZOLE SODIUM 40 MG PO TBEC
40.0000 mg | DELAYED_RELEASE_TABLET | Freq: Every day | ORAL | Status: DC
  Administered 2022-12-16 – 2022-12-17 (×2): 40 mg via ORAL
  Filled 2022-12-16 (×2): qty 1

## 2022-12-16 MED ORDER — AMLODIPINE BESYLATE 10 MG PO TABS
10.0000 mg | ORAL_TABLET | Freq: Every day | ORAL | Status: DC
  Administered 2022-12-17: 10 mg via ORAL
  Filled 2022-12-16: qty 1

## 2022-12-16 MED ORDER — PROTAMINE SULFATE 10 MG/ML IV SOLN
INTRAVENOUS | Status: AC
  Filled 2022-12-16: qty 5

## 2022-12-16 MED ORDER — EPHEDRINE SULFATE-NACL 50-0.9 MG/10ML-% IV SOSY
PREFILLED_SYRINGE | INTRAVENOUS | Status: DC | PRN
  Administered 2022-12-16 (×2): 5 mg via INTRAVENOUS

## 2022-12-16 MED ORDER — ASPIRIN 325 MG PO TBEC
325.0000 mg | DELAYED_RELEASE_TABLET | Freq: Every day | ORAL | Status: DC
  Administered 2022-12-17: 325 mg via ORAL
  Filled 2022-12-16: qty 1

## 2022-12-16 MED ORDER — HYDROCHLOROTHIAZIDE 25 MG PO TABS
25.0000 mg | ORAL_TABLET | Freq: Every day | ORAL | Status: DC
  Administered 2022-12-16 – 2022-12-17 (×2): 25 mg via ORAL
  Filled 2022-12-16 (×2): qty 1

## 2022-12-16 MED ORDER — SODIUM CHLORIDE 0.9 % IV SOLN: INTRAVENOUS | Status: DC

## 2022-12-16 MED ORDER — ACETAMINOPHEN 650 MG RE SUPP: 325.0000 mg | RECTAL | Status: DC | PRN

## 2022-12-16 MED ORDER — ALBUMIN HUMAN 5 % IV SOLN
12.5000 g | Freq: Once | INTRAVENOUS | Status: AC
  Administered 2022-12-16: 12.5 g via INTRAVENOUS

## 2022-12-16 MED ORDER — CLOPIDOGREL BISULFATE 75 MG PO TABS
75.0000 mg | ORAL_TABLET | Freq: Every day | ORAL | Status: DC
  Administered 2022-12-17: 75 mg via ORAL
  Filled 2022-12-16: qty 1

## 2022-12-16 MED ORDER — CHLORHEXIDINE GLUCONATE CLOTH 2 % EX PADS: 6.0000 | MEDICATED_PAD | Freq: Once | CUTANEOUS | Status: DC

## 2022-12-16 MED ORDER — POTASSIUM CHLORIDE CRYS ER 20 MEQ PO TBCR
20.0000 meq | EXTENDED_RELEASE_TABLET | Freq: Every day | ORAL | Status: AC | PRN
  Administered 2022-12-17: 40 meq via ORAL
  Filled 2022-12-16: qty 2

## 2022-12-16 MED ORDER — METOPROLOL TARTRATE 5 MG/5ML IV SOLN: 2.0000 mg | INTRAVENOUS | Status: DC | PRN

## 2022-12-16 MED ORDER — DEXAMETHASONE SODIUM PHOSPHATE 10 MG/ML IJ SOLN
INTRAMUSCULAR | Status: DC | PRN
  Administered 2022-12-16: 4 mg via INTRAVENOUS

## 2022-12-16 MED ORDER — DEXTRAN 40 IN SALINE 10-0.9 % IV SOLN
INTRAVENOUS | Status: AC | PRN
  Administered 2022-12-16: 500 mL

## 2022-12-16 MED ORDER — ALUM & MAG HYDROXIDE-SIMETH 200-200-20 MG/5ML PO SUSP: 15.0000 mL | ORAL | Status: DC | PRN

## 2022-12-16 MED ORDER — INSULIN ASPART 100 UNIT/ML IJ SOLN
INTRAMUSCULAR | Status: AC
  Administered 2022-12-16: 3 [IU] via SUBCUTANEOUS
  Filled 2022-12-16: qty 1

## 2022-12-16 MED ORDER — PHENYLEPHRINE HCL-NACL 20-0.9 MG/250ML-% IV SOLN
INTRAVENOUS | Status: DC | PRN
  Administered 2022-12-16: 40 ug/min via INTRAVENOUS

## 2022-12-16 MED ORDER — SUGAMMADEX SODIUM 200 MG/2ML IV SOLN
INTRAVENOUS | Status: DC | PRN
  Administered 2022-12-16: 360 mg via INTRAVENOUS

## 2022-12-16 MED ORDER — AMISULPRIDE (ANTIEMETIC) 5 MG/2ML IV SOLN: 10.0000 mg | Freq: Once | INTRAVENOUS | Status: DC | PRN

## 2022-12-16 MED ORDER — MIDAZOLAM HCL 2 MG/2ML IJ SOLN
INTRAMUSCULAR | Status: AC
  Filled 2022-12-16: qty 2

## 2022-12-16 MED ORDER — ACETAMINOPHEN 325 MG PO TABS: 325.0000 mg | ORAL_TABLET | ORAL | Status: DC | PRN

## 2022-12-16 MED ORDER — 0.9 % SODIUM CHLORIDE (POUR BTL) OPTIME
TOPICAL | Status: DC | PRN
  Administered 2022-12-16: 2000 mL

## 2022-12-16 MED ORDER — PRAVASTATIN SODIUM 40 MG PO TABS
40.0000 mg | ORAL_TABLET | Freq: Every day | ORAL | Status: DC
  Administered 2022-12-16: 40 mg via ORAL
  Filled 2022-12-16: qty 1

## 2022-12-16 MED ORDER — CHLORHEXIDINE GLUCONATE 0.12 % MT SOLN
OROMUCOSAL | Status: AC
  Administered 2022-12-16: 15 mL via OROMUCOSAL
  Filled 2022-12-16: qty 15

## 2022-12-16 MED ORDER — GUAIFENESIN-DM 100-10 MG/5ML PO SYRP: 15.0000 mL | ORAL_SOLUTION | ORAL | Status: DC | PRN

## 2022-12-16 MED ORDER — FENTANYL CITRATE (PF) 100 MCG/2ML IJ SOLN
25.0000 ug | INTRAMUSCULAR | Status: DC | PRN
  Administered 2022-12-16: 25 ug via INTRAVENOUS

## 2022-12-16 MED ORDER — LIDOCAINE 2% (20 MG/ML) 5 ML SYRINGE
INTRAMUSCULAR | Status: DC | PRN
  Administered 2022-12-16: 100 mg via INTRAVENOUS

## 2022-12-16 MED ORDER — OXYCODONE-ACETAMINOPHEN 5-325 MG PO TABS
1.0000 | ORAL_TABLET | ORAL | Status: DC | PRN
  Administered 2022-12-16 – 2022-12-17 (×3): 1 via ORAL
  Filled 2022-12-16 (×3): qty 1

## 2022-12-16 MED ORDER — PROPOFOL 10 MG/ML IV BOLUS
INTRAVENOUS | Status: DC | PRN
  Administered 2022-12-16: 150 mg via INTRAVENOUS

## 2022-12-16 MED ORDER — CHLORHEXIDINE GLUCONATE 0.12 % MT SOLN: 15.0000 mL | Freq: Once | OROMUCOSAL | Status: AC

## 2022-12-16 MED ORDER — HEPARIN SODIUM (PORCINE) 1000 UNIT/ML IJ SOLN
INTRAMUSCULAR | Status: DC | PRN
  Administered 2022-12-16 (×3): 2000 [IU] via INTRAVENOUS
  Administered 2022-12-16: 9000 [IU] via INTRAVENOUS
  Administered 2022-12-16: 3000 [IU] via INTRAVENOUS

## 2022-12-16 MED ORDER — HEPARIN 6000 UNIT IRRIGATION SOLUTION
Status: AC
  Filled 2022-12-16: qty 500

## 2022-12-16 MED ORDER — FENTANYL CITRATE (PF) 100 MCG/2ML IJ SOLN
INTRAMUSCULAR | Status: AC
  Filled 2022-12-16: qty 2

## 2022-12-16 MED ORDER — LIDOCAINE HCL (PF) 1 % IJ SOLN
INTRAMUSCULAR | Status: AC
  Filled 2022-12-16: qty 30

## 2022-12-16 MED ORDER — ACETAMINOPHEN 500 MG PO TABS
ORAL_TABLET | ORAL | Status: AC
  Administered 2022-12-16: 1000 mg via ORAL
  Filled 2022-12-16: qty 2

## 2022-12-16 MED ORDER — CEFAZOLIN SODIUM-DEXTROSE 2-4 GM/100ML-% IV SOLN
2.0000 g | INTRAVENOUS | Status: AC
  Administered 2022-12-16: 2 g via INTRAVENOUS
  Filled 2022-12-16: qty 100

## 2022-12-16 MED ORDER — LACTATED RINGERS IV SOLN: INTRAVENOUS | Status: DC | PRN

## 2022-12-16 MED ORDER — HEPARIN 6000 UNIT IRRIGATION SOLUTION
Status: DC | PRN
  Administered 2022-12-16: 1

## 2022-12-16 MED ORDER — INSULIN ASPART 100 UNIT/ML IJ SOLN: 0.0000 [IU] | INTRAMUSCULAR | Status: DC | PRN

## 2022-12-16 MED ORDER — MAGNESIUM SULFATE 2 GM/50ML IV SOLN: 2.0000 g | Freq: Every day | INTRAVENOUS | Status: DC | PRN

## 2022-12-16 MED ORDER — HEMOSTATIC AGENTS (NO CHARGE) OPTIME
TOPICAL | Status: DC | PRN
  Administered 2022-12-16: 3 via TOPICAL
  Administered 2022-12-16: 2 via TOPICAL

## 2022-12-16 MED ORDER — LACTATED RINGERS IV SOLN: INTRAVENOUS | Status: DC

## 2022-12-16 MED ORDER — PROTAMINE SULFATE 10 MG/ML IV SOLN
INTRAVENOUS | Status: DC | PRN
  Administered 2022-12-16: 50 mg via INTRAVENOUS

## 2022-12-16 MED ORDER — ORAL CARE MOUTH RINSE: 15.0000 mL | Freq: Once | OROMUCOSAL | Status: AC

## 2022-12-16 MED ORDER — ONDANSETRON HCL 4 MG/2ML IJ SOLN: 4.0000 mg | Freq: Four times a day (QID) | INTRAMUSCULAR | Status: DC | PRN

## 2022-12-16 MED ORDER — BUSPIRONE HCL 5 MG PO TABS
7.5000 mg | ORAL_TABLET | Freq: Three times a day (TID) | ORAL | Status: DC | PRN
  Administered 2022-12-16: 7.5 mg via ORAL
  Filled 2022-12-16: qty 2

## 2022-12-16 MED ORDER — PHENYLEPHRINE 80 MCG/ML (10ML) SYRINGE FOR IV PUSH (FOR BLOOD PRESSURE SUPPORT)
PREFILLED_SYRINGE | INTRAVENOUS | Status: DC | PRN
  Administered 2022-12-16: 40 ug via INTRAVENOUS
  Administered 2022-12-16: 20 ug via INTRAVENOUS
  Administered 2022-12-16: 40 ug via INTRAVENOUS
  Administered 2022-12-16 (×2): 80 ug via INTRAVENOUS

## 2022-12-16 MED ORDER — HYDROMORPHONE HCL 1 MG/ML IJ SOLN: 0.5000 mg | INTRAMUSCULAR | Status: DC | PRN

## 2022-12-16 MED ORDER — HEPARIN SODIUM (PORCINE) 5000 UNIT/ML IJ SOLN
5000.0000 [IU] | Freq: Three times a day (TID) | INTRAMUSCULAR | Status: DC
  Administered 2022-12-17: 5000 [IU] via SUBCUTANEOUS
  Filled 2022-12-16: qty 1

## 2022-12-16 MED ORDER — METFORMIN HCL ER 500 MG PO TB24
1000.0000 mg | ORAL_TABLET | Freq: Every day | ORAL | Status: DC
  Administered 2022-12-16: 1000 mg via ORAL
  Filled 2022-12-16: qty 2

## 2022-12-16 MED ORDER — DEXAMETHASONE SODIUM PHOSPHATE 10 MG/ML IJ SOLN
INTRAMUSCULAR | Status: AC
  Filled 2022-12-16: qty 1

## 2022-12-16 MED ORDER — OXYCODONE HCL 5 MG/5ML PO SOLN: 5.0000 mg | Freq: Once | ORAL | Status: DC | PRN

## 2022-12-16 MED ORDER — FENTANYL CITRATE (PF) 250 MCG/5ML IJ SOLN
INTRAMUSCULAR | Status: AC
  Filled 2022-12-16: qty 5

## 2022-12-16 MED ORDER — DOCUSATE SODIUM 100 MG PO CAPS
100.0000 mg | ORAL_CAPSULE | Freq: Every day | ORAL | Status: DC
  Administered 2022-12-17: 100 mg via ORAL
  Filled 2022-12-16: qty 1

## 2022-12-16 MED ORDER — SEMAGLUTIDE 14 MG PO TABS: 14.0000 mg | ORAL_TABLET | Freq: Every day | ORAL | Status: DC

## 2022-12-16 MED ORDER — INSULIN ASPART 100 UNIT/ML IJ SOLN
0.0000 [IU] | Freq: Three times a day (TID) | INTRAMUSCULAR | Status: DC
  Administered 2022-12-16: 4 [IU] via SUBCUTANEOUS
  Administered 2022-12-17: 2 [IU] via SUBCUTANEOUS

## 2022-12-16 MED ORDER — ACETAMINOPHEN 500 MG PO TABS: 1000.0000 mg | ORAL_TABLET | Freq: Once | ORAL | Status: AC

## 2022-12-16 MED ORDER — PROPOFOL 10 MG/ML IV BOLUS
INTRAVENOUS | Status: AC
  Filled 2022-12-16: qty 20

## 2022-12-16 MED ORDER — LABETALOL HCL 5 MG/ML IV SOLN: 10.0000 mg | INTRAVENOUS | Status: DC | PRN

## 2022-12-16 MED ORDER — HYDRALAZINE HCL 20 MG/ML IJ SOLN: 5.0000 mg | INTRAMUSCULAR | Status: DC | PRN

## 2022-12-16 MED ORDER — PHENOL 1.4 % MT LIQD
1.0000 | OROMUCOSAL | Status: DC | PRN
  Administered 2022-12-16: 1 via OROMUCOSAL
  Filled 2022-12-16: qty 177

## 2022-12-16 MED ORDER — GLIPIZIDE ER 10 MG PO TB24
10.0000 mg | ORAL_TABLET | Freq: Every day | ORAL | Status: DC
  Administered 2022-12-17: 10 mg via ORAL
  Filled 2022-12-16: qty 1

## 2022-12-16 SURGICAL SUPPLY — 67 items
ADH SKN CLS APL DERMABOND .7 (GAUZE/BANDAGES/DRESSINGS) ×1
ADH SKN CLS LQ APL DERMABOND (GAUZE/BANDAGES/DRESSINGS) ×1
ADPR TBG 2 MALE LL ART (MISCELLANEOUS)
AGENT HMST KT MTR STRL THRMB (HEMOSTASIS)
BAG COUNTER SPONGE SURGICOUNT (BAG) ×1 IMPLANT
BAG SPNG CNTER NS LX DISP (BAG) ×1
CANISTER SUCT 3000ML PPV (MISCELLANEOUS) ×1 IMPLANT
CANNULA VESSEL 3MM 2 BLNT TIP (CANNULA) ×1 IMPLANT
CATH ROBINSON RED A/P 18FR (CATHETERS) ×1 IMPLANT
CLIP LIGATING EXTRA MED SLVR (CLIP) ×2 IMPLANT
CLIP LIGATING EXTRA SM BLUE (MISCELLANEOUS) ×1 IMPLANT
COVER PROBE W GEL 5X96 (DRAPES) ×1 IMPLANT
COVER TRANSDUCER ULTRASND GEL (DISPOSABLE) ×2 IMPLANT
DERMABOND ADVANCED .7 DNX12 (GAUZE/BANDAGES/DRESSINGS) ×1 IMPLANT
DERMABOND ADVANCED .7 DNX6 (GAUZE/BANDAGES/DRESSINGS) IMPLANT
DRAIN CHANNEL 15F RND FF W/TCR (WOUND CARE) IMPLANT
DRAPE HALF SHEET 40X57 (DRAPES) IMPLANT
DRAPE WARM FLUID 44X44 (DRAPES) IMPLANT
ELECT REM PT RETURN 9FT ADLT (ELECTROSURGICAL) ×1
ELECTRODE REM PT RTRN 9FT ADLT (ELECTROSURGICAL) ×1 IMPLANT
EVACUATOR SILICONE 100CC (DRAIN) IMPLANT
GLOVE BIOGEL PI IND STRL 7.0 (GLOVE) IMPLANT
GLOVE BIOGEL PI IND STRL 8 (GLOVE) ×1 IMPLANT
GLOVE SURG SS PI 7.0 STRL IVOR (GLOVE) IMPLANT
GOWN STRL REUS W/ TWL LRG LVL3 (GOWN DISPOSABLE) ×2 IMPLANT
GOWN STRL REUS W/TWL 2XL LVL3 (GOWN DISPOSABLE) ×2 IMPLANT
GOWN STRL REUS W/TWL LRG LVL3 (GOWN DISPOSABLE) ×5
HEMOSTAT SNOW SURGICEL 2X4 (HEMOSTASIS) IMPLANT
IV ADAPTER SYR DOUBLE MALE LL (MISCELLANEOUS) IMPLANT
KIT BASIN OR (CUSTOM PROCEDURE TRAY) ×1 IMPLANT
KIT SHUNT ARGYLE CAROTID ART 6 (VASCULAR PRODUCTS) IMPLANT
KIT TURNOVER KIT B (KITS) ×2 IMPLANT
LOOP VESSEL MINI RED (MISCELLANEOUS) IMPLANT
NDL HYPO 25GX1X1/2 BEV (NEEDLE) IMPLANT
NDL SPNL 20GX3.5 QUINCKE YW (NEEDLE) IMPLANT
NEEDLE HYPO 25GX1X1/2 BEV (NEEDLE) IMPLANT
NEEDLE SPNL 20GX3.5 QUINCKE YW (NEEDLE) IMPLANT
NS IRRIG 1000ML POUR BTL (IV SOLUTION) ×6 IMPLANT
PACK CAROTID (CUSTOM PROCEDURE TRAY) ×1 IMPLANT
PAD ARMBOARD 7.5X6 YLW CONV (MISCELLANEOUS) ×2 IMPLANT
PATCH VASC XENOSURE 1CMX6CM (Vascular Products) ×1 IMPLANT
PATCH VASC XENOSURE 1X6 (Vascular Products) IMPLANT
POSITIONER HEAD DONUT 9IN (MISCELLANEOUS) ×1 IMPLANT
SET WALTER ACTIVATION W/DRAPE (SET/KITS/TRAYS/PACK) ×1 IMPLANT
SHUNT CAROTID BYPASS 10 (VASCULAR PRODUCTS) IMPLANT
SHUNT CAROTID BYPASS 12FRX15.5 (VASCULAR PRODUCTS) IMPLANT
SPONGE SURGIFOAM ABS GEL 100 (HEMOSTASIS) IMPLANT
STOPCOCK 4 WAY LG BORE MALE ST (IV SETS) IMPLANT
SURGIFLO W/THROMBIN 8M KIT (HEMOSTASIS) IMPLANT
SUT ETHILON 3 0 PS 1 (SUTURE) IMPLANT
SUT MNCRL AB 4-0 PS2 18 (SUTURE) ×1 IMPLANT
SUT PROLENE 6 0 BV (SUTURE) ×1 IMPLANT
SUT PROLENE 7 0 BV 1 (SUTURE) IMPLANT
SUT PROLENE BLUE 7 0 (SUTURE) IMPLANT
SUT SILK 2 0 SH (SUTURE) IMPLANT
SUT SILK 3 0 (SUTURE)
SUT SILK 3-0 18XBRD TIE 12 (SUTURE) IMPLANT
SUT VIC AB 2-0 CT1 27 (SUTURE) ×1
SUT VIC AB 2-0 CT1 TAPERPNT 27 (SUTURE) ×1 IMPLANT
SUT VIC AB 3-0 SH 27 (SUTURE) ×1
SUT VIC AB 3-0 SH 27X BRD (SUTURE) ×1 IMPLANT
SYR 10ML LL (SYRINGE) IMPLANT
SYR 20CC LL (SYRINGE) ×2 IMPLANT
SYR CONTROL 10ML LL (SYRINGE) IMPLANT
TOWEL GREEN STERILE (TOWEL DISPOSABLE) ×1 IMPLANT
TUBING ART PRESS 48 MALE/FEM (TUBING) IMPLANT
WATER STERILE IRR 1000ML POUR (IV SOLUTION) ×1 IMPLANT

## 2022-12-16 NOTE — Anesthesia Postprocedure Evaluation (Signed)
Anesthesia Post Note  Patient: Erica Evans  Procedure(s) Performed: RIGHT CAROTID ENDARTERECTOMY (Right: Neck)     Patient location during evaluation: PACU Anesthesia Type: General Level of consciousness: awake and alert Pain management: pain level controlled Vital Signs Assessment: post-procedure vital signs reviewed and stable Respiratory status: spontaneous breathing, nonlabored ventilation and respiratory function stable Cardiovascular status: blood pressure returned to baseline Postop Assessment: no apparent nausea or vomiting Anesthetic complications: no   No notable events documented.  Last Vitals:  Vitals:   12/16/22 1400 12/16/22 1432  BP: (!) 109/55 (!) 105/50  Pulse: 69 62  Resp: 16 16  Temp: 36.7 C 36.7 C  SpO2: 95% 97%    Last Pain:  Vitals:   12/16/22 1432  TempSrc: Oral  PainSc:                  Marthenia Rolling

## 2022-12-16 NOTE — Op Note (Signed)
NAME: Erica Evans    MRN: [id number] DOB: November 20, 1950    DATE OF OPERATION: 12/16/2022  PREOP DIAGNOSIS:    Asymptomatic right sided internal carotid artery stenosis  POSTOP DIAGNOSIS:    Same  PROCEDURE:    Right carotid endarterectomy with bovine pericardial patch plasty  SURGEON: Broadus John  ASSIST: Luisa Dago, PA  ANESTHESIA: General   EBL: 38m   INDICATIONS:    Erica Coachmanis a 72y.o. female with right-sided asymptomatic carotid artery disease.  Recent ultrasound demonstrated stenosis greater than 80%.  This was confirmed with CT of the neck.  After discussing the risks and benefits of right-sided carotid endarterectomy to prevent future risk of stroke, Jessyca elected to proceed.  FINDINGS:   Greater than 99% stenosis of the right internal carotid artery.  TECHNIQUE:    After full informed written consent was obtained from the patient, the patient was brought back to the operating room and placed supine upon the operating table.  Prior to induction, the patient received IV antibiotics.  After obtaining adequate anesthesia, the patient was placed into semi-Fowler position with a shoulder roll in place and the patient's neck slightly hyperextended and rotated away from the surgical site.  The patient was prepped in the standard fashion for a right carotid endarterectomy.  I made an incision anterior to the sternocleidomastoid muscle and dissected down through the subcutaneous tissue.  The platysmas was opened with electrocautery.  Then I dissected down to the internal jugular vein.  This was dissected posteriorly until I obtained visualization of the common carotid artery.  This was dissected out and then an umbilical tape was placed around the common carotid artery and I loosely applied a Rumel tourniquet.  I then dissected in a periadventitial fashion along the common carotid artery up to the bifurcation.  I then identified the external carotid  artery and the superior thyroid artery.  A vessel loop around the superior thyroid artery, and I also dissected out the external carotid artery and placed a vessel loop around it.  In continuing the dissection to the internal carotid artery, I identified the facial vein.  This was ligated and then transected, giving me improved exposure of the internal carotid artery.  In the process of this dissection, the hypoglossal nerve was identified.  I then dissected out the internal carotid artery until I identified an area of soft tissue in the internal carotid artery.  I dissected slightly distal to this area, and placed a vessel loop. At this point, we gave the patient a therapeutic bolus of Heparin intravenously (roughly 100 units/kg).  After waiting 3 minutes, I clamped the internal carotid artery, external carotid artery and then the common carotid artery.  I then made an arteriotomy in the common carotid artery with a 11 blade, and extended the arteriotomy with a Potts scissor down into the common carotid artery, then I carried the arteriotomy through the bifurcation into the internal carotid artery until I reached an area that was not diseased.    At this point, I started the endarterectomy in the common carotid artery with a PTechnical brewerand carried this dissection down into the common carotid artery circumferentially.  Then I transected the plaque at a segment where it was adherent.  I then carried this dissection up into the external carotid artery.  The plaque was extracted by unclamping the external carotid artery and everting the artery.  The dissection was then carried into the  internal carotid artery, extracting the remaining portion of the carotid plaque.  I passed the plaque off the field as a specimen. I placed a tacking stitch at the distal aspect as there was a small flap I did not want to chase higher. The tacking suture worked well.   At this point, I took the 10 shunt that previously been  prepared and I inserted it into the internal carotid artery.  The Rumel tourniquet was then applied to this end of the shunt.  I unclamped the shunt to verify retrograde blood flow in the internal carotid artery.  I then placed the other end of the shunt into the common carotid artery after unclamping the artery.  The Rumel was tightened down around the shunt.  At this point, I verified blood flow in the shunt with a continuous doppler.    I then spent the next 30 minutes removing intimal flaps and loose debris.  Eventually I reached the point where the residual plaque was densely adherent and any further dissection would compromise the integrity of the wall.  After verifying that there was no more loose intimal flaps or debris, I re-interrogated the entirety of this carotid artery.  At this point, I was satisfied that the minimal remaining disease was densely adherent to the wall and wall integrity was intact.  At this point, I then fashioned a bovine pericardial patch for the geometry of this artery and sewed it in place with two running stitch of 6-0 Prolene, one from each end.  Prior to completing this patch angioplasty, I removed the shunt first from the internal carotid artery, from which there was excellent backbleeding, and clamped it.  Then I removed the shunt from the common carotid artery, from which there was excellent antegrade bleeding, and then clamped it.  At this point, I allowed the external carotid artery to backbleed, which was excellent.  Then I instilled heparinized saline in this patched artery and then completed the patch angioplasty in the usual fashion.  First, I released the clamp on the external carotid artery, then I released it on the common carotid artery.  After waiting a few seconds, I then released it on the internal carotid artery.  I then interrogated this patient's arteries with the continuous Doppler.  The audible waveforms in each artery were consistent with the expected  characteristics for each artery.  The Sonosite probe was then sterilely draped and used to interrogate the carotid artery in both longitudinal and transverse views. There was mobile debris at the proximal clamp site, therefore, the internal, common, external carotid arteries were once again clamped.  A small arteriotomy was made proximal to the patch, and the small piece of debris, which appeared to be intima that may have sheared during Rummel tourniquet, was removed without issue.  This was closed primarily using running 6-0 Prolene suture.  Prior to closure, the carotid bulb was instilled with heparinized saline.  Clamps were removed beginning with the external, followed by common, by internal carotid arteries.    I washed out the wound, and placed thrombin and Gelfoam throughout.  I also gave the patient 50 mg of protamine to reverse his anticoagulation.   After waiting a few minutes, I removed the thrombin and Gelfoam and washed out the wound.  There was no more active bleeding in the surgical site.   I then reapproximated the platysma muscle with a running stitch of 3-0 Vicryl.  The skin was then reapproximated with a running subcuticular 4-0  Monocryl stitch.  The skin was then cleaned, dried and Dermabond was used to reinforce the skin closure.  The patient woke without any problems, neurologically intact.      Macie Burows, MD [facility] DATE OF DICTATION:   12/16/2022

## 2022-12-16 NOTE — Anesthesia Procedure Notes (Signed)
Procedure Name: Intubation Date/Time: 12/16/2022 7:56 AM  Performed by: Leonor Liv, CRNAPre-anesthesia Checklist: Patient identified, Emergency Drugs available, Suction available and Patient being monitored Patient Re-evaluated:Patient Re-evaluated prior to induction Oxygen Delivery Method: Circle System Utilized Preoxygenation: Pre-oxygenation with 100% oxygen Induction Type: IV induction Ventilation: Mask ventilation without difficulty Laryngoscope Size: Mac and 4 Grade View: Grade I Tube type: Oral Tube size: 7.0 mm Number of attempts: 1 Airway Equipment and Method: Stylet and Oral airway Placement Confirmation: ETT inserted through vocal cords under direct vision, positive ETCO2 and breath sounds checked- equal and bilateral Secured at: 21 cm Tube secured with: Tape Dental Injury: Teeth and Oropharynx as per pre-operative assessment

## 2022-12-16 NOTE — TOC Initial Note (Signed)
Transition of Care [facility]) - Initial/Assessment Note    Patient Details  Name: Erica Evans MRN: [id number] Date of Birth: 31-Aug-1951  [facility] CM/SW Contact:    Verdell Carmine, RN Phone Number: 12/16/2022, 3:46 PM  Clinical Narrative:                  TOC reviewed the patient information and found no needs at this time. The patient will be discussed in daily progression rounds. If a need is identified, please place a TOC consult       Patient Goals and CMS Choice            Expected Discharge Plan and Services                                              Prior Living Arrangements/Services                       Activities of Daily Living      Permission Sought/Granted                  Emotional Assessment              Admission diagnosis:  Carotid artery stenosis, asymptomatic, right [I65.21] Patient Active Problem List   Diagnosis Date Noted   Carotid artery stenosis, asymptomatic, right 12/16/2022   Hyperlipidemia associated with type 2 diabetes mellitus ([place]) 10/17/2018   Osteoporosis 12/27/2017   Vitamin D deficiency 06/27/2015   BMI 34.0-34.9,adult 06/14/2014   GAD (generalized anxiety disorder) 06/14/2014   Hypertension associated with diabetes ([place]) 04/17/2013   Diabetes ([place]) 04/17/2013   PCP:  [name], DO Pharmacy:   [id number] [place]Phone: [phone]: [phone] [id number] [place]: [phone]: [phone] [id number]hone: [phone]: [phone]   Social Determinants of Health (SDOH) Social History: [place] Food  Insecurity (02/18/2022)  Housing: Low Risk  (02/18/2022)  Transportation Needs: No Transportation Needs (02/18/2022)  Alcohol Screen: Low Risk  (02/18/2022)  Depression (PHQ2-9): Low Risk  (12/06/2022)  Financial Resource Strain: Low Risk  (02/18/2022)  Physical Activity: Insufficiently Active (02/18/2022)  Social Connections: Socially Integrated (02/18/2022)  Stress: No Stress Concern Present (02/18/2022)  Tobacco Use: Medium Risk (12/16/2022)   SDOH Interventions:     Readmission Risk Interventions     No data to display

## 2022-12-16 NOTE — H&P (Signed)
Patient seen and examined in preop holding.  No complaints. No changes to medication history or physical exam since last seen in clinic. After discussing the risks and benefits of right carotid endarterectomy for critical, asymptomatic stenosis, Erica Evans elected to proceed. Will give plavix this morning.   Broadus John MD   I connected with Erica Evans on 12/16/2022 using the [email or web address] by telephone and verified that I was speaking with the correct person using two identifiers. Patient was located at home and accompanied by no one. I am located at office.   The limitations of evaluation and management by telemedicine and the availability of in person appointments have been previously discussed with the patient and are documented in the patients chart. The patient expressed understanding and consented to proceed.  PCP: [name], DO   Chief Complaint: Follow-up asymptomatic carotid stenosis  History of Present Illness: Erica Evans is a 72 y.o. female with initially seen in the office by myself on 10/20/2022.  She had [facility] screening showing severe right carotid stenosis.  Duplex could not determine if she had normal internal carotid past the bifurcation therefore I recommended CT angiogram.  I am discussing of this with her by telephone today  Past Medical History:  Diagnosis Date   Arthritis    COVID-19 2022   Diabetes mellitus without complication (HCC)    GERD (gastroesophageal reflux disease)    Hyperlipidemia    Hypertension    Palpitations    Vertigo     History reviewed. No pertinent surgical history.  Current Meds  Medication Sig   amLODipine (NORVASC) 10 MG tablet TAKE 1 TABLET BY MOUTH EVERY DAY   ASPIRIN EC PO Take 325 mg by mouth at bedtime. Swallow whole.   busPIRone (BUSPAR) 15 MG tablet Take 1 tablet (15 mg total) by mouth 3 (three) times daily. (Patient taking differently: Take 7.5 mg by mouth 3 (three) times  daily as needed (anxiety).)   Cholecalciferol (D-3-5) 125 MCG (5000 UT) capsule Take 5,000 Units by mouth in the morning.   clopidogrel (PLAVIX) 75 MG tablet Take 1 tablet (75 mg total) by mouth daily.   cyclobenzaprine (FLEXERIL) 5 MG tablet Take 1 tablet (5 mg total) by mouth at bedtime. (Patient taking differently: Take 5 mg by mouth at bedtime as needed for muscle spasms.)   empagliflozin (JARDIANCE) 25 MG TABS tablet Take 1 tablet (25 mg total) by mouth daily.   glipiZIDE (GLUCOTROL XL) 10 MG 24 hr tablet Take 1 tablet (10 mg total) by mouth daily with breakfast.   hydrochlorothiazide (HYDRODIURIL) 25 MG tablet TAKE 1 TABLET (25 MG TOTAL) BY MOUTH DAILY.   metFORMIN (GLUCOPHAGE-XR) 500 MG 24 hr tablet Take 2 tablets (1,000 mg total) by mouth daily with supper.   pravastatin (PRAVACHOL) 40 MG tablet Take 1 tablet (40 mg total) by mouth daily. (Patient taking differently: Take 40 mg by mouth at bedtime.)   Semaglutide (RYBELSUS) 14 MG TABS Take 1 tablet (14 mg total) by mouth daily. [place] Patient Assistance Program   [DISCONTINUED] hydrochlorothiazide (HYDRODIURIL) 25 MG tablet Take 1 tablet (25 mg total) by mouth daily.    12 system ROS was negative unless otherwise noted in HPI   Observations/Objective: CT angiogram from 11/26/2022 revealed string sign at the bifurcation of her common carotid artery.  She has normal internal carotid with normal caliber into the skull base.  Significant left carotid stenosis  Assessment and  Plan: Critical right internal carotid artery stenosis, asymptomatic.  Recommended right carotid endarterectomy for reduction of stroke risk.  I again discussed in detail the procedure with expected 1 night hospitalization.  Also discussed the potential for 1-1 and half percent risk of stroke with surgery.  I did offer opportunity for the patient to meet with her [facility] prior to the day of surgery.  She is comfortable meeting him on the morning of  surgery.  Will coordinate this through our [facility] office  Follow Up Instructions:  Surgery at [facility]   I discussed the assessment and treatment plan with the patient. The patient was provided an opportunity to ask questions and all were answered. The patient agreed with the plan and demonstrated an understanding of the instructions.   The patient was advised to call back or seek an in-person evaluation if the symptoms worsen or if the condition fails to improve as anticipated.  I spent 11-15 minutes with the patient via telephone encounter. 12/16/2022, 7:52 AM

## 2022-12-16 NOTE — Anesthesia Procedure Notes (Signed)
Arterial Line Insertion Start/End2/11/2022 7:05 AM, 12/16/2022 7:20 AM Performed by: Leonor Liv, CRNA, CRNA  Patient location: Pre-op. Preanesthetic checklist: patient identified, IV checked, site marked, risks and benefits discussed, surgical consent, monitors and equipment checked, pre-op evaluation, timeout performed and anesthesia consent Lidocaine 1% used for infiltration Left, radial was placed Catheter size: 20 G  Attempts: 1 Procedure performed without using ultrasound guided technique. Following insertion, dressing applied and Biopatch. Patient tolerated the procedure well with no immediate complications.

## 2022-12-16 NOTE — Transfer of Care (Signed)
Immediate Anesthesia Transfer of Care Note  Patient: Erica Evans  Procedure(s) Performed: RIGHT CAROTID ENDARTERECTOMY (Right: Neck)  Patient Location: PACU  Anesthesia Type:General  Level of Consciousness: awake, alert , oriented, and patient cooperative  Airway & Oxygen Therapy: Patient Spontanous Breathing and Patient connected to face mask oxygen  Post-op Assessment: Report given to RN, Post -op Vital signs reviewed and stable, Patient moving all extremities X 4, and Patient able to stick tongue midline  Post vital signs: Reviewed and stable  Last Vitals:  Vitals Value Taken Time  BP 87/46 12/16/22 1035  Temp    Pulse 80 12/16/22 1040  Resp 17 12/16/22 1040  SpO2 97 % 12/16/22 1040  Vitals shown include unvalidated device data.  Last Pain:  Vitals:   12/16/22 0602  TempSrc:   PainSc: 0-No pain      Patients Stated Pain Goal: 0 (57/47/34 0370)  Complications: No notable events documented.

## 2022-12-16 NOTE — Discharge Summary (Addendum)
Discharge Summary     Erica Evans September 29, 1951 72 y.o. female  [id number]  Admission Date: 12/16/2022  Discharge Date: 12/17/2022  Physician: Broadus John, MD  Admission Diagnosis: Carotid artery stenosis, asymptomatic, right [I65.21]   HPI:   This is a 72 y.o. female with initially seen in the office by myself on 10/20/2022. She had [facility] screening showing severe right carotid stenosis. Duplex could not determine if she had normal internal carotid past the bifurcation therefore I recommended CT angiogram. I am discussing of this with her by telephone today   Hospital Course:  The patient was admitted to the hospital and taken to the operating room on 12/16/2022 and underwent right CEA.    Findings: Greater than 99% stenosis of the right internal carotid artery.   The pt tolerated the procedure well and was transported to the PACU in good condition.  Post op, she was doing well with neuro in tact and no difficulty swallowing.    By POD 1, the pt neuro status was intact.  She did have a sore throat but no difficulty swallowing.  Her incision looks fine without hematoma.  She was able to void and and ambulate without difficulty.   CBC    Component Value Date/Time   WBC 8.8 12/17/2022 0446   RBC 4.01 12/17/2022 0446   HGB 9.6 (L) 12/17/2022 0446   HGB 13.4 05/12/2022 0912   HCT 30.3 (L) 12/17/2022 0446   HCT 42.9 05/12/2022 0912   PLT 358 12/17/2022 0446   PLT 548 (H) 05/12/2022 0912   MCV 75.6 (L) 12/17/2022 0446   MCV 75 (L) 05/12/2022 0912   MCH 23.9 (L) 12/17/2022 0446   MCHC 31.7 12/17/2022 0446   RDW 15.9 (H) 12/17/2022 0446   RDW 15.4 05/12/2022 0912   LYMPHSABS 2.8 05/12/2022 0912   MONOABS 0.4 04/18/2008 1115   EOSABS 0.2 05/12/2022 0912   BASOSABS 0.1 05/12/2022 0912   BMET    Component Value Date/Time   NA 136 12/17/2022 0446   NA 137 05/12/2022 0912   K 3.3 (L) 12/17/2022 0446   CL 101 12/17/2022 0446   CO2 26 12/17/2022 0446    GLUCOSE 128 (H) 12/17/2022 0446   BUN 14 12/17/2022 0446   BUN 14 05/12/2022 0912   CREATININE 0.90 12/17/2022 0446   CREATININE 0.72 04/25/2013 1424   CALCIUM 8.3 (L) 12/17/2022 0446   EGFR 66 05/12/2022 0912   GFRNONAA >60 12/17/2022 0446   GFRNONAA >89 04/25/2013 1424      Discharge Instructions     Discharge patient   Complete by: As directed    Discharge home once pt has ambulated in the hallways, has eaten breakfast and been seen by Dr. Virl Cagey.  Thanks   Discharge disposition: 01-Home or Self Care   Discharge patient date: 12/17/2022       Discharge Diagnosis:  Carotid artery stenosis, asymptomatic, right [I65.21]  Secondary Diagnosis: Patient Active Problem List   Diagnosis Date Noted   Carotid artery stenosis, asymptomatic, right 12/16/2022   Hyperlipidemia associated with type 2 diabetes mellitus ([place]) 10/17/2018   Osteoporosis 12/27/2017   Vitamin D deficiency 06/27/2015   BMI 34.0-34.9,adult 06/14/2014   GAD (generalized anxiety disorder) 06/14/2014   Hypertension associated with diabetes ([place]) 04/17/2013   Diabetes ([place]) 04/17/2013   Past Medical History:  Diagnosis Date   Arthritis    COVID-19 2022   Diabetes mellitus without complication (HCC)    GERD (gastroesophageal reflux disease)    Hyperlipidemia  Hypertension    Palpitations    Vertigo     Allergies as of 12/17/2022       Reactions   Lipitor [atorvastatin]    Myopathy/fibromyalgia    Morphine And Related    Farxiga [dapagliflozin]    UTI/CONSTIPATION TOLERATES JARDIANCE        Medication List     TAKE these medications    amLODipine 10 MG tablet Commonly known as: NORVASC TAKE 1 TABLET BY MOUTH EVERY DAY   ASPIRIN EC PO Take 325 mg by mouth at bedtime. Swallow whole.   blood glucose meter kit and supplies Check BS once daily   busPIRone 15 MG tablet Commonly known as: BUSPAR Take 1 tablet (15 mg total) by mouth 3 (three) times daily. What changed:  how much to  take when to take this reasons to take this   clopidogrel 75 MG tablet Commonly known as: PLAVIX Take 1 tablet (75 mg total) by mouth daily.   cyclobenzaprine 5 MG tablet Commonly known as: FLEXERIL Take 1 tablet (5 mg total) by mouth at bedtime. What changed:  when to take this reasons to take this   D-3-5 125 MCG (5000 UT) capsule Generic drug: Cholecalciferol Take 5,000 Units by mouth in the morning.   empagliflozin 25 MG Tabs tablet Commonly known as: JARDIANCE Take 1 tablet (25 mg total) by mouth daily.   glipiZIDE 10 MG 24 hr tablet Commonly known as: GLUCOTROL XL TAKE 1 TABLET (10 MG TOTAL) BY MOUTH DAILY WITH BREAKFAST.   hydrochlorothiazide 25 MG tablet Commonly known as: HYDRODIURIL TAKE 1 TABLET (25 MG TOTAL) BY MOUTH DAILY.   lidocaine 5 % Commonly known as: Lidoderm Place 1 patch onto the skin daily. Remove & Discard patch within 12 hours or as directed by MD   metFORMIN 500 MG 24 hr tablet Commonly known as: GLUCOPHAGE-XR Take 2 tablets (1,000 mg total) by mouth daily with supper.   OneTouch Verio test strip Generic drug: glucose blood USE TO TEST BLOOD SUGAR ONCE DAILY DX E11.9   oxyCODONE-acetaminophen 5-325 MG tablet Commonly known as: Percocet Take 1 tablet by mouth every 6 (six) hours as needed for severe pain.   pravastatin 40 MG tablet Commonly known as: PRAVACHOL Take 1 tablet (40 mg total) by mouth daily. What changed: when to take this   Rybelsus 14 MG Tabs Generic drug: Semaglutide Take 1 tablet (14 mg total) by mouth daily. [name]         [facility] Discharge Instructions Carotid Endarterectomy (CEA)  Please refer to the following instructions for your post-procedure care. Your surgeon or physician assistant will discuss any changes with you.  Activity  You are encouraged to walk as much as you can. You can slowly return to normal activities but must  avoid strenuous activity and heavy lifting until your doctor tell you it's OK. Avoid activities such as vacuuming or swinging a golf club. You can drive after one week if you are comfortable and you are no longer taking prescription pain medications. It is normal to feel tired for serval weeks after your surgery. It is also normal to have difficulty with sleep habits, eating, and bowel movements after surgery. These will go away with time.  Bathing/Showering  You may shower after you come home. Do not soak in a bathtub, hot tub, or swim until the incision heals completely.  Incision Care  Shower every day. Clean your incision with mild soap  and water. Pat the area dry with a clean towel. You do not need a bandage unless otherwise instructed. Do not apply any ointments or creams to your incision. You may have skin glue on your incision. Do not peel it off. It will come off on its own in about one week. Your incision may feel thickened and raised for several weeks after your surgery. This is normal and the skin will soften over time. For Men Only: It's OK to shave around the incision but do not shave the incision itself for 2 weeks. It is common to have numbness under your chin that could last for several months.  Diet  Resume your normal diet. There are no special food restrictions following this procedure. A low fat/low cholesterol diet is recommended for all patients with vascular disease. In order to heal from your surgery, it is CRITICAL to get adequate nutrition. Your body requires vitamins, minerals, and protein. Vegetables are the best source of vitamins and minerals. Vegetables also provide the perfect balance of protein. Processed food has little nutritional value, so try to avoid this.  Medications  Resume taking all of your medications unless your doctor or physician assistant tells you not to.  If your incision is causing pain, you may take over-the- counter pain relievers such as  acetaminophen (Tylenol). If you were prescribed a stronger pain medication, please be aware these medications can cause nausea and constipation.  Prevent nausea by taking the medication with a snack or meal. Avoid constipation by drinking plenty of fluids and eating foods with a high amount of fiber, such as fruits, vegetables, and grains.  Do not take Tylenol if you are taking prescription pain medications.  Follow Up  Our office will schedule a follow up appointment 2-3 weeks following discharge.  Please call us immediately for any of the following conditions  Increased pain, redness, drainage (pus) from your incision site. Fever of 101 degrees or higher. If you should develop stroke (slurred speech, difficulty swallowing, weakness on one side of your body, loss of vision) you should call 911 and go to the nearest emergency room.  Reduce your risk of vascular disease:  Stop smoking. If you would like help call [facility] at 1-800-QUIT-NOW 431-528-0456) or [place] at [phone]. Manage your cholesterol Maintain a desired weight Control your diabetes Keep your blood pressure down  If you have any questions, please call the office at [phone].  Prescriptions given: 1.   Roxicet #8 No Refill  Disposition: home  Patient's condition: is Good  Follow up: 1. [facility] in 2-3 weeks in [place] with Dr. Donnetta Hutching (no studies)   Leontine Locket, PA-C [facility] [phone]   --- For [facility] Registry use ---   Modified Rankin score at D/C (0-6): 0  IV medication needed for:  1. Hypertension: No 2. Hypotension: No  Post-op Complications: No  1. Post-op CVA or TIA: No  If yes: Event classification (right eye, left eye, right cortical, left cortical, verterobasilar, other): n/a  If yes: Timing of event (intra-op, <6 hrs post-op, >=6 hrs post-op, unknown): n/a  2. CN injury: No  If yes: CN n/a injuried   3. Myocardial infarction: No  If yes: Dx by  (EKG or clinical, Troponin): n/a  4.  CHF: No  5.  Dysrhythmia (new): No  6. Wound infection: No  7. Reperfusion symptoms: No  8. Return to OR: No  If yes: return to OR for (bleeding, neurologic, other CEA incision, other): n/a  Discharge medications: Statin use:  Yes ASA use:  Yes   Beta blocker use:  No ACE-Inhibitor use:  No  ARB use:  No CCB use: Yes P2Y12 Antagonist use: Yes, [x ] Plavix, '[ ]'$  Plasugrel, '[ ]'$  Ticlopinine, '[ ]'$  Ticagrelor, '[ ]'$  Other, '[ ]'$  No for medical reason, '[ ]'$  Non-compliant, '[ ]'$  Not-indicated Anti-coagulant use:  No, '[ ]'$  Warfarin, '[ ]'$  Rivaroxaban, '[ ]'$  Dabigatran,

## 2022-12-16 NOTE — Discharge Instructions (Signed)
   [facility]  Discharge Instructions   Carotid Surgery  Please refer to the following instructions for your post-procedure care. Your surgeon or physician assistant will discuss any changes with you.  Activity  You are encouraged to walk as much as you can. You can slowly return to normal activities but must avoid strenuous activity and heavy lifting until your doctor tell you it's okay. Avoid activities such as vacuuming or swinging a golf club. You can drive after one week if you are comfortable and you are no longer taking prescription pain medications. It is normal to feel tired for serval weeks after your surgery. It is also normal to have difficulty with sleep habits, eating, and bowel movements after surgery. These will go away with time.  Bathing/Showering  Shower daily after you go home. Do not soak in a bathtub, hot tub, or swim until the incision heals completely.  Incision Care  Shower every day. Clean your incision with mild soap and water. Pat the area dry with a clean towel. You do not need a bandage unless otherwise instructed. Do not apply any ointments or creams to your incision. You may have skin glue on your incision. Do not peel it off. It will come off on its own in about one week. Your incision may feel thickened and raised for several weeks after your surgery. This is normal and the skin will soften over time.   For Men Only: It's okay to shave around the incision but do not shave the incision itself for 2 weeks. It is common to have numbness under your chin that could last for several months.  Diet  Resume your normal diet. There are no special food restrictions following this procedure. A low fat/low cholesterol diet is recommended for all patients with vascular disease. In order to heal from your surgery, it is CRITICAL to get adequate nutrition. Your body requires vitamins, minerals, and protein. Vegetables are the best source of  vitamins and minerals. Vegetables also provide the perfect balance of protein. Processed food has little nutritional value, so try to avoid this.  Medications  Resume taking all of your medications unless your doctor or physician assistant tells you not to. If your incision is causing pain, you may take over-the- counter pain relievers such as acetaminophen (Tylenol). If you were prescribed a stronger pain medication, please be aware these medications can cause nausea and constipation. Prevent nausea by taking the medication with a snack or meal. Avoid constipation by drinking plenty of fluids and eating foods with a high amount of fiber, such as fruits, vegetables, and grains.   Do not take Tylenol if you are taking prescription pain medications.  Follow Up  Our office will schedule a follow up appointment 2-3 weeks following discharge.  Please call us immediately for any of the following conditions  . Increased pain, redness, drainage (pus) from your incision site. . Fever of 101 degrees or higher. . If you should develop stroke (slurred speech, difficulty swallowing, weakness on one side of your body, loss of vision) you should call 911 and go to the nearest emergency room. .  Reduce your risk of vascular disease:  . Stop smoking. If you would like help call [facility] at 1-800-QUIT-NOW (1-800-784-8669) or  at [phone]. . Manage your cholesterol . Maintain a desired weight . Control your diabetes . Keep your blood pressure down .  If you have any questions, please call the office at [phone]. 

## 2022-12-16 NOTE — Progress Notes (Signed)
  Day of Surgery Note    Subjective:  says she just started getting a little pain at the incision site. Says she has peed.  She is swallowing without difficulty.    Vitals:   12/16/22 1600 12/16/22 1630  BP: (!) 99/59 (!) 104/31  Pulse: 65 76  Resp: 18 19  Temp: 98.8 F (37.1 C)   SpO2: 92% 93%    Incisions:   clean and dry without hematoma Neuro:  moving all extremities equally; tongue is midline Cardiac:  regular Lungs:  non labored    Assessment/Plan:  This is a 72 y.o. female who is s/p  Right CEA  -doing well post op with neuro in tact. -she has voided and swallowing without difficulty.  -continue asa/statin/plavix -most likely home tomorrow if no overnight issues.    Leontine Locket, PA-C 12/16/2022 4:41 PM [phone]

## 2022-12-16 NOTE — Progress Notes (Signed)
Patient seen and examined in preop holding.  No complaints. No changes to medication history or physical exam since last seen in clinic. After discussing the risks and benefits of right carotid endarterectomy for critical, asymptomatic stenosis, Erica Evans elected to proceed. Will give plavix this morning.   Broadus John MD h  I connected with Erica Evans on 12/16/2022 using the [employer] by telephone and verified that I was speaking with the correct person using two identifiers. Patient was located at home and accompanied by no one. I am located at office.   The limitations of evaluation and management by telemedicine and the availability of in person appointments have been previously discussed with the patient and are documented in the patients chart. The patient expressed understanding and consented to proceed.  PCP: [name], DO   Chief Complaint: Follow-up asymptomatic carotid stenosis  History of Present Illness: Erica Evans is a 72 y.o. female with initially seen in the office by myself on 10/20/2022.  She had [facility] screening showing severe right carotid stenosis.  Duplex could not determine if she had normal internal carotid past the bifurcation therefore I recommended CT angiogram.  I am discussing of this with her by telephone today  Past Medical History:  Diagnosis Date   Arthritis    COVID-19 2022   Diabetes mellitus without complication (HCC)    GERD (gastroesophageal reflux disease)    Hyperlipidemia    Hypertension    Palpitations    Vertigo     History reviewed. No pertinent surgical history.  Current Meds  Medication Sig   amLODipine (NORVASC) 10 MG tablet TAKE 1 TABLET BY MOUTH EVERY DAY   ASPIRIN EC PO Take 325 mg by mouth at bedtime. Swallow whole.   busPIRone (BUSPAR) 15 MG tablet Take 1 tablet (15 mg total) by mouth 3 (three) times daily. (Patient taking differently: Take 7.5 mg by mouth 3 (three) times  daily as needed (anxiety).)   Cholecalciferol (D-3-5) 125 MCG (5000 UT) capsule Take 5,000 Units by mouth in the morning.   clopidogrel (PLAVIX) 75 MG tablet Take 1 tablet (75 mg total) by mouth daily.   cyclobenzaprine (FLEXERIL) 5 MG tablet Take 1 tablet (5 mg total) by mouth at bedtime. (Patient taking differently: Take 5 mg by mouth at bedtime as needed for muscle spasms.)   empagliflozin (JARDIANCE) 25 MG TABS tablet Take 1 tablet (25 mg total) by mouth daily.   glipiZIDE (GLUCOTROL XL) 10 MG 24 hr tablet Take 1 tablet (10 mg total) by mouth daily with breakfast.   hydrochlorothiazide (HYDRODIURIL) 25 MG tablet TAKE 1 TABLET (25 MG TOTAL) BY MOUTH DAILY.   metFORMIN (GLUCOPHAGE-XR) 500 MG 24 hr tablet Take 2 tablets (1,000 mg total) by mouth daily with supper.   pravastatin (PRAVACHOL) 40 MG tablet Take 1 tablet (40 mg total) by mouth daily. (Patient taking differently: Take 40 mg by mouth at bedtime.)   Semaglutide (RYBELSUS) 14 MG TABS Take 1 tablet (14 mg total) by mouth daily. [place] Patient Assistance Program   [DISCONTINUED] hydrochlorothiazide (HYDRODIURIL) 25 MG tablet Take 1 tablet (25 mg total) by mouth daily.    12 system ROS was negative unless otherwise noted in HPI   Observations/Objective: CT angiogram from 11/26/2022 revealed string sign at the bifurcation of her common carotid artery.  She has normal internal carotid with normal caliber into the skull base.  Significant left carotid stenosis  Assessment and  Plan: Critical right internal carotid artery stenosis, asymptomatic.  Recommended right carotid endarterectomy for reduction of stroke risk.  I again discussed in detail the procedure with expected 1 night hospitalization.  Also discussed the potential for 1-1 and half percent risk of stroke with surgery.  I did offer opportunity for the patient to meet with her [facility] prior to the day of surgery.  She is comfortable meeting him on the morning of  surgery.  Will coordinate this through our [facility] office  Follow Up Instructions:  Surgery at [facility]   I discussed the assessment and treatment plan with the patient. The patient was provided an opportunity to ask questions and all were answered. The patient agreed with the plan and demonstrated an understanding of the instructions.   The patient was advised to call back or seek an in-person evaluation if the symptoms worsen or if the condition fails to improve as anticipated.  I spent 11-15 minutes with the patient via telephone encounter. 12/16/2022, 7:33 AM

## 2022-12-17 ENCOUNTER — Telehealth: Payer: Self-pay

## 2022-12-17 ENCOUNTER — Encounter (HOSPITAL_COMMUNITY): Payer: Self-pay | Admitting: Vascular Surgery

## 2022-12-17 LAB — POCT I-STAT, CHEM 8
BUN: 15 mg/dL (ref 8–23)
Calcium, Ion: 1.16 mmol/L (ref 1.15–1.40)
Chloride: 100 mmol/L (ref 98–111)
Creatinine, Ser: 0.8 mg/dL (ref 0.44–1.00)
Glucose, Bld: 128 mg/dL — ABNORMAL HIGH (ref 70–99)
HCT: 34 % — ABNORMAL LOW (ref 36.0–46.0)
Hemoglobin: 11.6 g/dL — ABNORMAL LOW (ref 12.0–15.0)
Potassium: 3.1 mmol/L — ABNORMAL LOW (ref 3.5–5.1)
Sodium: 138 mmol/L (ref 135–145)
TCO2: 25 mmol/L (ref 22–32)

## 2022-12-17 LAB — BASIC METABOLIC PANEL
Anion gap: 9 (ref 5–15)
BUN: 14 mg/dL (ref 8–23)
CO2: 26 mmol/L (ref 22–32)
Calcium: 8.3 mg/dL — ABNORMAL LOW (ref 8.9–10.3)
Chloride: 101 mmol/L (ref 98–111)
Creatinine, Ser: 0.9 mg/dL (ref 0.44–1.00)
GFR, Estimated: >60 mL/min (ref >60)
Glucose, Bld: 128 mg/dL — ABNORMAL HIGH (ref 70–99)
Potassium: 3.3 mmol/L — ABNORMAL LOW (ref 3.5–5.1)
Sodium: 136 mmol/L (ref 135–145)

## 2022-12-17 LAB — CBC
HCT: 30.3 % — ABNORMAL LOW (ref 36.0–46.0)
Hemoglobin: 9.6 g/dL — ABNORMAL LOW (ref 12.0–15.0)
MCH: 23.9 pg — ABNORMAL LOW (ref 26.0–34.0)
MCHC: 31.7 g/dL (ref 30.0–36.0)
MCV: 75.6 fL — ABNORMAL LOW (ref 80.0–100.0)
Platelets: 358 10*3/uL (ref 150–400)
RBC: 4.01 MIL/uL (ref 3.87–5.11)
RDW: 15.9 % — ABNORMAL HIGH (ref 11.5–15.5)
WBC: 8.8 10*3/uL (ref 4.0–10.5)
nRBC: 0 % (ref 0.0–0.2)

## 2022-12-17 LAB — GLUCOSE, CAPILLARY: Glucose-Capillary: 139 mg/dL — ABNORMAL HIGH (ref 70–99)

## 2022-12-17 LAB — POCT ACTIVATED CLOTTING TIME
Activated Clotting Time: 244 seconds
Activated Clotting Time: 260 seconds
Activated Clotting Time: 255 seconds
Activated Clotting Time: 271 seconds

## 2022-12-17 LAB — LIPID PANEL
Cholesterol: 98 mg/dL (ref 0–200)
HDL: 40 mg/dL — ABNORMAL LOW (ref >40)
LDL Cholesterol: 30 mg/dL (ref 0–99)
Total CHOL/HDL Ratio: 2.5 RATIO
Triglycerides: 141 mg/dL (ref <150)
VLDL: 28 mg/dL (ref 0–40)

## 2022-12-17 MED ORDER — OXYCODONE-ACETAMINOPHEN 5-325 MG PO TABS: 1.0000 | ORAL_TABLET | Freq: Four times a day (QID) | ORAL | 0 refills | Status: DC | PRN

## 2022-12-17 NOTE — Progress Notes (Signed)
PHARMACIST LIPID MONITORING   Erica Evans is a 72 y.o. female admitted on 12/16/2022 with severe R carotid stenosis now s/p R carotid endarterectomy with bovine pericardial patch plasty.  Pharmacy has been consulted to optimize lipid-lowering therapy with the indication of secondary prevention for clinical ASCVD.  Recent Labs:  Lipid Panel (last 6 months):   Lab Results  Component Value Date   CHOL 98 12/17/2022   TRIG 141 12/17/2022   HDL 40 (L) 12/17/2022   CHOLHDL 2.5 12/17/2022   VLDL 28 12/17/2022   LDLCALC 30 12/17/2022    Hepatic function panel (last 6 months):   Lab Results  Component Value Date   AST 26 12/13/2022   ALT 15 12/13/2022   ALKPHOS 71 12/13/2022   BILITOT 0.4 12/13/2022    SCr (since admission):   Serum creatinine: 0.9 mg/dL 12/17/22 0446 Estimated creatinine clearance: 67.2 mL/min  Current therapy and lipid therapy tolerance Current lipid-lowering therapy: pravastatin '40mg'$  daily Previous lipid-lowering therapies (if applicable):  Atorvastatin prior to June 2014  Rosuvastatin '20mg'$  daily June 2014 - August 2016 Pravastatin '40mg'$  daily December 2019 - present Documented or reported allergies or intolerances to lipid-lowering therapies (if applicable): history of myopathy and fibromyalgia with atorvastatin (prior to 2014). Tolerates rosuvastatin and pravastatin.  Assessment:   Lipid panel on 12/17/22 with LDL of 30, below goal of LDL <55 on home lipid therapy of pravastatin '40mg'$  daily. No changes to lipid therapy indicated.  Plan:    1.Statin intensity (high intensity recommended for all patients regardless of the LDL):  No statin changes. Pt is already on pravastatin '40mg'$  daily with LDL <55.   2.Add ezetimibe (if any one of the following):   Not indicated at this time.  3.Refer to lipid clinic:   No  4.Follow-up with:  Primary care provider - Ronnie Doss M, DO  5.Follow-up labs after discharge:  No changes in lipid therapy, repeat a  lipid panel in one year.      Luisa Hart, PharmD, BCPS Clinical Pharmacist 12/17/2022 7:56 AM   Please refer to AMION for pharmacy phone number

## 2022-12-17 NOTE — Telephone Encounter (Signed)
Pt called stating that the pain medication that was sent to [facility] is not there and she was told it had not been received.   Called pharmacy, spoke to pharmacist who stated they had not received it.  Spoke with [employer], [place] who investigated in the chart and found that the pharmacy had confirmed receipt earlier this morning.  Called pharmacy again and the pharmacist stated that it came through a few minutes after the first call. Medication being filled.

## 2022-12-17 NOTE — Progress Notes (Addendum)
Discharge instructions reviewed with pt and family member. Handout provided on post op carotid endarterectomy and when to call MD, also reviewed stroke symptoms and calling 911 if has any of these symptoms.  Copy of instructions given to pt. Pt informed scripts sent to her pharmacy for pick up. Pt getting dressed and will call to front desk when dressed and ready.  Pt to be d/c'd via wheelchair with belongings, with family.           To be escorted by staff/hospital volunteer.

## 2022-12-17 NOTE — Progress Notes (Addendum)
  Progress Note    12/17/2022 6:34 AM 1 Day Post-Op  Subjective:  says she has a sore throat and her incision is a little sore.  Denies any trouble swallowing.  Has been up to the [facility] a lot during the night but has not walked in the hallways yet.  Says she is supposed to f/u with Dr. Donnetta Hutching in [place].   Afebrile HR 50's-90's 149'F-026'V systolic 78% RA  Gtts: none  Vitals:   12/17/22 0300 12/17/22 0400  BP: (!) 117/56 (!) 100/57  Pulse: 71 63  Resp: 19 17  Temp: 98 F (36.7 C) 97.8 F (36.6 C)  SpO2: 93% 96%     Physical Exam: Neuro:  in tact; moving all extremities equally; tongue is midline Lungs:  non labored Incision:  clean and dry without hematoma  CBC    Component Value Date/Time   WBC 8.8 12/17/2022 0446   RBC 4.01 12/17/2022 0446   HGB 9.6 (L) 12/17/2022 0446   HGB 13.4 05/12/2022 0912   HCT 30.3 (L) 12/17/2022 0446   HCT 42.9 05/12/2022 0912   PLT 358 12/17/2022 0446   PLT 548 (H) 05/12/2022 0912   MCV 75.6 (L) 12/17/2022 0446   MCV 75 (L) 05/12/2022 0912   MCH 23.9 (L) 12/17/2022 0446   MCHC 31.7 12/17/2022 0446   RDW 15.9 (H) 12/17/2022 0446   RDW 15.4 05/12/2022 0912   LYMPHSABS 2.8 05/12/2022 0912   MONOABS 0.4 04/18/2008 1115   EOSABS 0.2 05/12/2022 0912   BASOSABS 0.1 05/12/2022 0912    BMET    Component Value Date/Time   NA 136 12/17/2022 0446   NA 137 05/12/2022 0912   K 3.3 (L) 12/17/2022 0446   CL 101 12/17/2022 0446   CO2 26 12/17/2022 0446   GLUCOSE 128 (H) 12/17/2022 0446   BUN 14 12/17/2022 0446   BUN 14 05/12/2022 0912   CREATININE 0.90 12/17/2022 0446   CREATININE 0.72 04/25/2013 1424   CALCIUM 8.3 (L) 12/17/2022 0446   GFRNONAA >60 12/17/2022 0446   GFRNONAA >89 04/25/2013 1424   GFRAA 74 02/08/2020 0835   GFRAA >89 04/25/2013 1424     Intake/Output Summary (Last 24 hours) at 12/17/2022 0634 Last data filed at 12/16/2022 1852 Gross per 24 hour  Intake 1348.74 ml  Output 1050 ml  Net 298.74 ml      Assessment/Plan:  This is a 72 y.o. female who is s/p right CEA 1 Day Post-Op  -pt is doing well this am. -acute surgical blood loss anemia-tolerating -pt neuro exam is in tact -pt has ambulated to [facility] throughout the night-needs to walk in the hallways.  -pt has voided -f/u with [facility]- with Dr. Donnetta Hutching in 2-3 weeks  -continue asa/statin/plavix -PDMP reviewed.   Leontine Locket, PA-C [facility] [phone]  VASCULAR STAFF ADDENDUM: I have independently interviewed and examined the patient. I agree with the above.  No issues ambulating. Feels sore, but no deficits.  Continue ASA and plavix for 30 days, then can be single antiplatelet agent.  Will follow up with my partner Dr. Sherren Mocha Early.    Cassandria Santee, MD [facility] Phone Number: [phone] 12/17/2022 9:26 AM

## 2022-12-20 ENCOUNTER — Telehealth: Payer: Self-pay

## 2022-12-20 NOTE — Telephone Encounter (Signed)
Transition Care Management Follow-up Telephone Call Date of discharge and from where: [facility] 12/17/2022 How have you been since you were released from the hospital? good Any questions or concerns? No  Items Reviewed: Did the pt receive and understand the discharge instructions provided? Yes  Medications obtained and verified? Yes  Other? No  Any new allergies since your discharge? No  Dietary orders reviewed? Yes Do you have support at home? Yes   Home Care and Equipment/Supplies: Were home health services ordered? no If so, what is the name of the agency? N/a  Has the agency set up a time to come to the patient's home? no Were any new equipment or medical supplies ordered?  No What is the name of the medical supply agency? N/a Were you able to get the supplies/equipment? no Do you have any questions related to the use of the equipment or supplies? No  Functional Questionnaire: (I = Independent and D = Dependent) ADLs: I  Bathing/Dressing- I  Meal Prep- I  Eating- I  Maintaining continence- I  Transferring/Ambulation- I  Managing Meds- I  Follow up appointments reviewed:  PCP Hospital f/u appt confirmed? No  Specialist Hospital f/u appt confirmed? Yes  Scheduled to see Dr Donnetta Hutching on 01/05/2023 @ 10:00. Are transportation arrangements needed? No  If their condition worsens, is the pt aware to call PCP or go to the Emergency Dept.? Yes Was the patient provided with contact information for the PCP's office or ED? Yes Was to pt encouraged to call back with questions or concerns? Yes Juanda Crumble, LPN [place] Direct Dial [phone]

## 2023-01-04 ENCOUNTER — Other Ambulatory Visit: Payer: Self-pay | Admitting: Family Medicine

## 2023-01-05 ENCOUNTER — Encounter: Payer: Self-pay | Admitting: Vascular Surgery

## 2023-01-05 ENCOUNTER — Ambulatory Visit (INDEPENDENT_AMBULATORY_CARE_PROVIDER_SITE_OTHER): Payer: Medicare HMO | Admitting: Vascular Surgery

## 2023-01-05 VITALS — BP 134/79 | HR 87 | Temp 97.3°F | Ht 67.0 in | Wt 197.0 lb

## 2023-01-05 DIAGNOSIS — I6521 Occlusion and stenosis of right carotid artery: Secondary | ICD-10-CM

## 2023-01-05 NOTE — Progress Notes (Signed)
[facility] [place]  Patient name: Erica Evans MRN: [id number] DOB: [birth year] Sex: female  REASON FOR VISIT: Follow-up right carotid endarterectomy for severe asymptomatic disease with Dr. Virl Cagey on 12/16/2022  HPI: Erica Evans is a 72 y.o. female here today for follow-up.  Underwent uneventful right carotid endarterectomy was discharged home on postoperative day #1.  She has done well at home.  She has had no neurologic deficits.  She does report the usual peri-incisional soreness and numbness.  Current Outpatient Medications  Medication Sig Dispense Refill   amLODipine (NORVASC) 10 MG tablet TAKE 1 TABLET BY MOUTH EVERY DAY 90 tablet 0   ASPIRIN EC PO Take 325 mg by mouth at bedtime. Swallow whole.     blood glucose meter kit and supplies Check BS once daily 1 each 0   busPIRone (BUSPAR) 15 MG tablet Take 1 tablet (15 mg total) by mouth 3 (three) times daily. (Patient taking differently: Take 7.5 mg by mouth 3 (three) times daily as needed (anxiety).) 270 tablet 3   Cholecalciferol (D-3-5) 125 MCG (5000 UT) capsule Take 5,000 Units by mouth in the morning.     clopidogrel (PLAVIX) 75 MG tablet Take 1 tablet (75 mg total) by mouth daily. 30 tablet 6   cyclobenzaprine (FLEXERIL) 5 MG tablet TAKE 1 TABLET BY MOUTH EVERYDAY AT BEDTIME 30 tablet 2   empagliflozin (JARDIANCE) 25 MG TABS tablet Take 1 tablet (25 mg total) by mouth daily. 90 tablet 3   glipiZIDE (GLUCOTROL XL) 10 MG 24 hr tablet TAKE 1 TABLET (10 MG TOTAL) BY MOUTH DAILY WITH BREAKFAST. 90 tablet 0   hydrochlorothiazide (HYDRODIURIL) 25 MG tablet TAKE 1 TABLET (25 MG TOTAL) BY MOUTH DAILY. 30 tablet 5   lidocaine (LIDODERM) 5 % Place 1 patch onto the skin daily. Remove & Discard patch within 12 hours or as directed by MD 30 patch 0   metFORMIN (GLUCOPHAGE-XR) 500 MG 24 hr tablet Take 2 tablets (1,000 mg total) by mouth daily with supper. 180 tablet 3    ONETOUCH VERIO test strip USE TO TEST BLOOD SUGAR ONCE DAILY DX E11.9 100 strip 3   pravastatin (PRAVACHOL) 40 MG tablet Take 1 tablet (40 mg total) by mouth daily. (Patient taking differently: Take 40 mg by mouth at bedtime.) 90 tablet 3   Semaglutide (RYBELSUS) 14 MG TABS Take 1 tablet (14 mg total) by mouth daily. [place] Patient Assistance Program 30 tablet 4   oxyCODONE-acetaminophen (PERCOCET) 5-325 MG tablet Take 1 tablet by mouth every 6 (six) hours as needed for severe pain. (Patient not taking: Reported on 01/05/2023) 8 tablet 0   No current facility-administered medications for this visit.     PHYSICAL EXAM: Vitals:   01/05/23 0910 01/05/23 0913  BP: (!) 152/84 134/79  Pulse: 87   Temp: (!) 97.3 F (36.3 C)   SpO2: 97%   Weight: 197 lb (89.4 kg)   Height: 5' 7"$  (1.702 m)     GENERAL: The patient is a well-nourished female, in no acute distress. The vital signs are documented above. Right neck incision is healing nicely with a typical healing ridge.  Grossly intact neurologically  MEDICAL ISSUES: Stable following right carotid endarterectomy.  Will continue on Plavix for a total of 1 month postop and then discontinue Plavix and continue aspirin therapy.  We will see her again in 9 months with repeat carotid duplex   Rosetta Posner, MD FACS [facility]  [facility] [employer] Tel [phone]  Note: Portions of this report may have been transcribed using voice recognition software.  Every effort has been made to ensure accuracy; however, inadvertent computerized transcription errors may still be present.

## 2023-01-08 ENCOUNTER — Other Ambulatory Visit: Payer: Self-pay

## 2023-01-08 DIAGNOSIS — I6529 Occlusion and stenosis of unspecified carotid artery: Secondary | ICD-10-CM

## 2023-01-27 ENCOUNTER — Telehealth: Payer: Self-pay | Admitting: Family Medicine

## 2023-01-27 NOTE — Telephone Encounter (Signed)
Patient wants to know if her Erica Evans Rx can be switched to something else because the Jardiance makes her burn when she has to pee.

## 2023-01-28 NOTE — Telephone Encounter (Signed)
Pt is coming in 03/25

## 2023-01-28 NOTE — Telephone Encounter (Signed)
We can. Can you get her set up with a sooner appt so we can check her urine and talk about alternatives?

## 2023-02-07 ENCOUNTER — Ambulatory Visit (INDEPENDENT_AMBULATORY_CARE_PROVIDER_SITE_OTHER): Payer: Medicare HMO | Admitting: Family Medicine

## 2023-02-07 ENCOUNTER — Encounter: Payer: Self-pay | Admitting: Family Medicine

## 2023-02-07 VITALS — BP 140/82 | HR 84 | Temp 98.4°F | Ht 67.0 in | Wt 198.0 lb

## 2023-02-07 DIAGNOSIS — R300 Dysuria: Secondary | ICD-10-CM | POA: Diagnosis not present

## 2023-02-07 DIAGNOSIS — B3731 Acute candidiasis of vulva and vagina: Secondary | ICD-10-CM | POA: Diagnosis not present

## 2023-02-07 DIAGNOSIS — E1169 Type 2 diabetes mellitus with other specified complication: Secondary | ICD-10-CM | POA: Diagnosis not present

## 2023-02-07 LAB — URINALYSIS
Bilirubin, UA: NEGATIVE
Ketones, UA: NEGATIVE
Leukocytes,UA: NEGATIVE
Nitrite, UA: NEGATIVE
Protein,UA: NEGATIVE
RBC, UA: NEGATIVE
Specific Gravity, UA: 1.01 (ref 1.005–1.030)
Urobilinogen, Ur: 0.2 mg/dL (ref 0.2–1.0)
pH, UA: 5.5 (ref 5.0–7.5)

## 2023-02-07 LAB — WET PREP FOR TRICH, YEAST, CLUE
Clue Cell Exam: NEGATIVE
Trichomonas Exam: NEGATIVE
Yeast Exam: NEGATIVE

## 2023-02-07 MED ORDER — FLUCONAZOLE 150 MG PO TABS: 150.0000 mg | ORAL_TABLET | Freq: Once | ORAL | 0 refills | Status: AC

## 2023-02-07 MED ORDER — PIOGLITAZONE HCL 15 MG PO TABS: 15.0000 mg | ORAL_TABLET | Freq: Every day | ORAL | 3 refills | Status: DC

## 2023-02-07 NOTE — Progress Notes (Signed)
Subjective: CC: Dysuria PCP: [name], DO [id number] [name] is a 72 y.o. female presenting to clinic today for:  1.  Dysuria Patient reports couple week history of dysuria.  She reports vaginal itching, irritation.  She has been on Jardiance and feels like this is contributing.  She saw some blood when she wiped on Friday and this caused her concern.   ROS: Per HPI  Allergies  Allergen Reactions   Lipitor [Atorvastatin]     Myopathy/fibromyalgia    Morphine And Related    Farxiga [Dapagliflozin]     UTI/CONSTIPATION TOLERATES JARDIANCE   Past Medical History:  Diagnosis Date   Arthritis    COVID-19 2022   Diabetes mellitus without complication (HCC)    GERD (gastroesophageal reflux disease)    Hyperlipidemia    Hypertension    Palpitations    Vertigo     Current Outpatient Medications:    amLODipine (NORVASC) 10 MG tablet, TAKE 1 TABLET BY MOUTH EVERY DAY, Disp: 90 tablet, Rfl: 0   ASPIRIN EC PO, Take 325 mg by mouth at bedtime. Swallow whole., Disp: , Rfl:    blood glucose meter kit and supplies, Check BS once daily, Disp: 1 each, Rfl: 0   busPIRone (BUSPAR) 15 MG tablet, Take 1 tablet (15 mg total) by mouth 3 (three) times daily. (Patient taking differently: Take 7.5 mg by mouth 3 (three) times daily as needed (anxiety).), Disp: 270 tablet, Rfl: 3   Cholecalciferol (D-3-5) 125 MCG (5000 UT) capsule, Take 5,000 Units by mouth in the morning., Disp: , Rfl:    clopidogrel (PLAVIX) 75 MG tablet, Take 1 tablet (75 mg total) by mouth daily., Disp: 30 tablet, Rfl: 6   cyclobenzaprine (FLEXERIL) 5 MG tablet, TAKE 1 TABLET BY MOUTH EVERYDAY AT BEDTIME, Disp: 30 tablet, Rfl: 2   empagliflozin (JARDIANCE) 25 MG TABS tablet, Take 1 tablet (25 mg total) by mouth daily., Disp: 90 tablet, Rfl: 3   glipiZIDE (GLUCOTROL XL) 10 MG 24 hr tablet, TAKE 1 TABLET (10 MG TOTAL) BY MOUTH DAILY WITH BREAKFAST., Disp: 90 tablet, Rfl: 0   hydrochlorothiazide (HYDRODIURIL) 25 MG  tablet, TAKE 1 TABLET (25 MG TOTAL) BY MOUTH DAILY., Disp: 30 tablet, Rfl: 5   lidocaine (LIDODERM) 5 %, Place 1 patch onto the skin daily. Remove & Discard patch within 12 hours or as directed by MD, Disp: 30 patch, Rfl: 0   metFORMIN (GLUCOPHAGE-XR) 500 MG 24 hr tablet, Take 2 tablets (1,000 mg total) by mouth daily with supper., Disp: 180 tablet, Rfl: 3   ONETOUCH VERIO test strip, USE TO TEST BLOOD SUGAR ONCE DAILY DX E11.9, Disp: 100 strip, Rfl: 3   oxyCODONE-acetaminophen (PERCOCET) 5-325 MG tablet, Take 1 tablet by mouth every 6 (six) hours as needed for severe pain., Disp: 8 tablet, Rfl: 0   pravastatin (PRAVACHOL) 40 MG tablet, Take 1 tablet (40 mg total) by mouth daily. (Patient taking differently: Take 40 mg by mouth at bedtime.), Disp: 90 tablet, Rfl: 3   Semaglutide (RYBELSUS) 14 MG TABS, Take 1 tablet (14 mg total) by mouth daily. [place] Patient Assistance Program, Disp: 30 tablet, Rfl: 4 Social History   Socioeconomic History   Marital status: Married    Spouse name: [name]   Number of children: 2   Years of education: Not on file   Highest education level: Associate degree: academic program  Occupational History   Occupation: Control and instrumentation engineer    Comment: retired  Tobacco Use  Smoking status: Former    Types: Cigarettes    Quit date: 1990    Years since quitting: 34.2   Smokeless tobacco: Never  Vaping Use   Vaping Use: Never used  Substance and Sexual Activity   Alcohol use: No   Drug use: No   Sexual activity: Not on file  Other Topics Concern   Not on file  Social History Narrative   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 She lives at home with her husband [name] and her grown son. She has 3 children. She enjoys bird watching and walking.    Social Determinants of Health   Financial Resource Strain: Low Risk  (02/18/2022)   Overall Financial Resource Strain (CARDIA)    Difficulty of Paying Living Expenses: Not hard at all  Food Insecurity: No Food  Insecurity (02/18/2022)   Hunger Vital Sign    Worried About Running Out of Food in the Last Year: Never true    Ran Out of Food in the Last Year: Never true  Transportation Needs: No Transportation Needs (02/18/2022)   PRAPARE - Hydrologist (Medical): No    Lack of Transportation (Non-Medical): No  Physical Activity: Insufficiently Active (02/18/2022)   Exercise Vital Sign    Days of Exercise per Week: 7 days    Minutes of Exercise per Session: 20 min  Stress: No Stress Concern Present (02/18/2022)   [place]    Feeling of Stress : Only a little  Social Connections: Socially Integrated (02/18/2022)   Social Connection and Isolation Panel [NHANES]    Frequency of Communication with Friends and Family: More than three times a week    Frequency of Social Gatherings with Friends and Family: More than three times a week    Attends Religious Services: More than 4 times per year    Active Member of [employer] or Organizations: Yes    Attends Archivist Meetings: More than 4 times per year    Marital Status: Married  Human resources officer Violence: Not At Risk (02/18/2022)   Humiliation, Afraid, Rape, and Kick questionnaire    Fear of Current or Ex-Partner: No    Emotionally Abused: No    Physically Abused: No    Sexually Abused: No   Family History  Problem Relation Age of Onset   Coronary artery disease Mother    Diabetes Mother    Heart disease Mother    Coronary artery disease Father    Heart disease Father    Diabetes Sister    Hypertension Sister    Heart disease Sister    Diabetes Brother    Diabetes Sister    Arthritis Sister    Hypertension Sister    Diabetes Sister    Hypertension Sister    Hypertension Sister     Objective: Office vital signs reviewed. BP (!) 140/82   Pulse 84   Temp 98.4 F (36.9 C)   Ht 5\' 7"  (1.702 m)   Wt 198 lb (89.8 kg)   SpO2 97%   BMI 31.01 kg/m    Physical Examination:  General: Awake, alert, well nourished, No acute distress GU: Vulva is erythematous, swollen.  No abrasions/excoriations.  No bleeding.  She does have some cheesy white discharge noted externally.  Suspect former hemorrhoid given rectal tag noted  Assessment/ Plan: 72 y.o. female   Yeast vaginitis - Plan: fluconazole (DIFLUCAN) 150 MG tablet, WET PREP FOR TRICH, YEAST, CLUE  Dysuria -  Plan: Urinalysis  Type 2 diabetes mellitus with other specified complication, without long-term current use of insulin (HCC) - Plan: pioglitazone (ACTOS) 15 MG tablet  Yeast vaginitis presumably secondary to use of Jardiance.  Discontinue Jardiance.  Diflucan sent.  Urinalysis without evidence of infection.   Encourage p.o. hydration.  Stop Jardiance.  Start pioglitazone.  Orders Placed This Encounter  Procedures   Urinalysis   No orders of the defined types were placed in this encounter.    [name], DO [place] [phone]

## 2023-02-07 NOTE — Patient Instructions (Signed)
STOP Jardiance START Pioglitazone  Diaper cream to irritated vaginal areas Diflucan sent for yeast infection  Vaginal Yeast Infection, Adult  Vaginal yeast infection is a condition that causes vaginal discharge as well as soreness, swelling, and redness (inflammation) of the vagina. This is a common condition. Some women get this infection frequently. What are the causes? This condition is caused by a change in the normal balance of the yeast (Candida) and normal bacteria that live in the vagina. This change causes an overgrowth of yeast, which causes the inflammation. What increases the risk? The condition is more likely to develop in women who: Take antibiotic medicines. Have diabetes. Take birth control pills. Are pregnant. Douche often. Have a weak body defense system (immune system). Have been taking steroid medicines for a long time. Frequently wear tight clothing. What are the signs or symptoms? Symptoms of this condition include: White, thick, creamy vaginal discharge. Swelling, itching, redness, and irritation of the vagina. The lips of the vagina (labia) may be affected as well. Pain or a burning feeling while urinating. Pain during sex. How is this diagnosed? This condition is diagnosed based on: Your medical history. A physical exam. A pelvic exam. Your health care provider will examine a sample of your vaginal discharge under a microscope. Your health care provider may send this sample for testing to confirm the diagnosis. How is this treated? This condition is treated with medicine. Medicines may be over-the-counter or prescription. You may be told to use one or more of the following: Medicine that is taken by mouth (orally). Medicine that is applied as a cream (topically). Medicine that is inserted directly into the vagina (suppository). Follow these instructions at home: Take or apply over-the-counter and prescription medicines only as told by your health care  provider. Do not use tampons until your health care provider approves. Do not have sex until your infection has cleared. Sex can prolong or worsen your symptoms of infection. Ask your health care provider when it is safe to resume sexual activity. Keep all follow-up visits. This is important. How is this prevented?  Do not wear tight clothes, such as pantyhose or tight pants. Wear breathable cotton underwear. Do not use douches, perfumed soap, creams, or powders. Wipe from front to back after using the toilet. If you have diabetes, keep your blood sugar levels under control. Ask your health care provider for other ways to prevent yeast infections. Contact a health care provider if: You have a fever. Your symptoms go away and then return. Your symptoms do not get better with treatment. Your symptoms get worse. You have new symptoms. You develop blisters in or around your vagina. You have blood coming from your vagina and it is not your menstrual period. You develop pain in your abdomen. Summary Vaginal yeast infection is a condition that causes discharge as well as soreness, swelling, and redness (inflammation) of the vagina. This condition is treated with medicine. Medicines may be over-the-counter or prescription. Take or apply over-the-counter and prescription medicines only as told by your health care provider. Do not douche. Resume sexual activity or use of tampons as instructed by your health care provider. Contact a health care provider if your symptoms do not get better with treatment or your symptoms go away and then return. This information is not intended to replace advice given to you by your health care provider. Make sure you discuss any questions you have with your health care provider. Document Revised: 01/19/2021 Document Reviewed: 01/19/2021 Elsevier Patient  Education  2023 [employer]  

## 2023-02-22 ENCOUNTER — Ambulatory Visit (INDEPENDENT_AMBULATORY_CARE_PROVIDER_SITE_OTHER): Payer: Medicare HMO

## 2023-02-22 VITALS — Ht 67.0 in | Wt 198.0 lb

## 2023-02-22 DIAGNOSIS — Z0000 Encounter for general adult medical examination without abnormal findings: Secondary | ICD-10-CM | POA: Diagnosis not present

## 2023-02-22 DIAGNOSIS — Z1231 Encounter for screening mammogram for malignant neoplasm of breast: Secondary | ICD-10-CM

## 2023-02-22 NOTE — Patient Instructions (Signed)
[name] , Thank you for taking time to come for your Medicare Wellness Visit. I appreciate your ongoing commitment to your health goals. Please review the following plan we discussed and let me know if I can assist you in the future.   These are the goals we discussed:  Goals       Patient Stated      02/18/2022  AWV Goal: Exercise for General Health  Patient will verbalize understanding of the benefits of increased physical activity: Exercising regularly is important. It will improve your overall fitness, flexibility, and endurance. Regular exercise also will improve your overall health. It can help you control your weight, reduce stress, and improve your bone density. Over the next year, patient will increase physical activity as tolerated with a goal of at least 150 minutes of moderate physical activity per week.  You can tell that you are exercising at a moderate intensity if your heart starts beating faster and you start breathing faster but can still hold a conversation. Moderate-intensity exercise ideas include: Walking 1 mile (1.6 km) in about 15 minutes Biking Hiking Golfing Dancing Water aerobics Patient will verbalize understanding of everyday activities that increase physical activity by providing examples like the following: Yard work, such as: Insurance underwriter Gardening Washing windows or floors Patient will be able to explain general safety guidelines for exercising:  Before you start a new exercise program, talk with your health care provider. Do not exercise so much that you hurt yourself, feel dizzy, or get very short of breath. Wear comfortable clothes and wear shoes with good support. Drink plenty of water while you exercise to prevent dehydration or heat stroke. Work out until your breathing and your heartbeat get faster.       Patient Stated      Wants to get back on  her diet and get sugar back under control and reduce pain      T2DM-PHARMD GOAL (pt-stated)      Current Barriers:  Unable to independently afford treatment regimen Unable to maintain control of T2DM  Pharmacist Clinical Goal(s):  Over the next 90 days, patient will verbalize ability to afford treatment regimen maintain control of T2DM as evidenced by IMPROVED GLYCEMIC CONTROL  through collaboration with PharmD and provider.    Interventions: 1:1 collaboration with Raliegh Ip, DO regarding development and update of comprehensive plan of care as evidenced by provider attestation and co-signature Inter-disciplinary care team collaboration (see longitudinal plan of care) Comprehensive medication review performed; medication list updated in electronic medical record  Diabetes: CONTROLLED-A1C 8.4-->6.8% Current treatment:  RYBELSUS 14MG  DAILY, JARDIANCE 25MG , METFORMIN, GLIPIZIDE; GFR 66 INTOLERANCE REPORTED TO [facility], BUT TOLERATES JARDIANCE CONSIDER D/C GLIPIZIDE CONTINUE RYBELSUS 14MG  DAILY SLOWLY TITRATED RYBELSUS DUE TO GI SIDE EFFECTS Denies personal and family history of Medullary thyroid cancer (MTC) SUBMITTED DOSE CHANGE TO NOVO NORDISK PAP CONTINUE METFORMIN XR 1G AT DINNER ONLY CONTINUE JARDIANCE 25MG  DAILY  PATIENT RE-ENROLLED FOR BI CARES PATIENT ASSISTANCE UNTIL 11/15/23 Current glucose readings: fasting glucose: <150, as low as 124, post prandial glucose: <200 Denies hypoglycemic/hyperglycemic symptoms Discussed meal planning options and Plate method for healthy eating Avoid sugary drinks and desserts Incorporate balanced protein, non starchy veggies, 1 serving of carbohydrate with each meal Increase water intake Increase physical activity as able  Current exercise: N/A Educated on DIABETES, MEDICATIONS PATIENT TO BE RE-ENROLLED FOR BI CARES (JARDIANCE) AND NOVO NORDISK (RYBELSUS)  PATIENT ASSISTANCE PROGRAMS FOR 2024 NOTE ROUTED TO SCHEDULE F/U PCP 6 MONTHS  POST LAST PCP APPT   Hyperlipidemia: Recommended FOLLOWING A HEART HEALTHY DIET/HEALTHY PLATE METHOD Statin myopathy-G72 Tolerating pravastatin "okay" LDL slightly above goal Will continue current management for now/LFTs within normal limits Consider ezetimibe (Zetia) in future Lipid Panel     Component Value Date/Time   CHOL 158 05/12/2022 0912   CHOL 119 04/17/2013 1110   TRIG 135 05/12/2022 0912   TRIG 178 (H) 09/19/2014 0918   TRIG 148 04/17/2013 1110   HDL 53 05/12/2022 0912   HDL 50 09/19/2014 0918   HDL 49 04/17/2013 1110   CHOLHDL 3.0 05/12/2022 0912   LDLCALC 81 05/12/2022 0912   LDLCALC 99 06/14/2014 0859   LDLCALC 40 04/17/2013 1110   LABVLDL 24 05/12/2022 0912   Patient Goals/Self-Care Activities Over the next 90 days, patient will:  - take medications as prescribed collaborate with provider on medication access solutions  Follow Up Plan: Telephone follow up appointment with care management team member scheduled for: 3 MONTHSBarriers:  Unable to independently afford treatment regimen Unable to maintain control of T2DM  Pharmacist Clinical Goal(s):  Over the next 90 days, patient will verbalize ability to afford treatment regimen maintain control of T2DM as evidenced by IMPROVED GLYCEMIC CONTROL  through collaboration with PharmD and provider.    Interventions: 1:1 collaboration with Raliegh Ip, DO regarding development and update of comprehensive plan of care as evidenced by provider attestation and co-signature Inter-disciplinary care team collaboration (see longitudinal plan of care) Comprehensive medication review performed; medication list updated in electronic medical record  Diabetes: CONTROLLED-A1C 8.4-->7.9-->6.8% (IMPROVING) current treatment: RYBELSUS 14MG  DAILY, JARDIANCE 25MG , METFORMIN, GLIPIZIDE; GFR 64 INTOLERANCE REPORTED TO [facility], BUT TOLERATING JARDIANCE WELL PLAN TO D/C GLIPIZIDE CONTINUE RYBELSUS 14MG  DAILY SLOWLY TITRATED  RYBELSUS DUE TO GI SIDE EFFECTS Denies personal and family history of Medullary thyroid cancer (MTC) SUBMITTED DOSE CHANGE TO NOVO NORDISK PAP CONTINUE METFORMIN XR 1G AT DINNER ONLY CONTINUE JARDIANCE 25MG  DAILY  PATIENT APPROVED FOR BI CARES PATIENT ASSISTANCE UNTIL 11/14/22 Current glucose readings: fasting glucose: <160, as low as 124, post prandial glucose: <200 Denies hypoglycemic/hyperglycemic symptoms Discussed meal planning options and Plate method for healthy eating Avoid sugary drinks and desserts Incorporate balanced protein, non starchy veggies, 1 serving of carbohydrate with each meal Increase water intake Increase physical activity as able  Current exercise: N/A Educated on DIABETES, MEDICATIONS PATIENT APPROVED FOR BI CARES (JARDIANCE) AND NOVO NORDISK (RYBELSUS) PATIENT ASSISTANCE PROGRAMS UNTIL 11/14/22  Patient Goals/Self-Care Activities Over the next 90 days, patient will:  - take medications as prescribed collaborate with provider on medication access solutions  Follow Up Plan: Telephone follow up appointment with care management team member scheduled for: 3 MONTHS        This is a list of the screening recommended for you and due dates:  Health Maintenance  Topic Date Due   COVID-19 Vaccine (6 - 2023-24 season) 07/16/2022   Zoster (Shingles) Vaccine (1 of 2) 03/07/2023*   Mammogram  11/10/2023*   Yearly kidney health urinalysis for diabetes  05/13/2023   Hemoglobin A1C  06/06/2023   Flu Shot  06/16/2023   Pneumonia Vaccine (3 of 3 - PPSV23 or PCV20) 10/18/2023   Eye exam for diabetics  11/19/2023   Complete foot exam   12/07/2023   Yearly kidney function blood test for diabetes  12/18/2023   Medicare Annual Wellness Visit  02/22/2024   DEXA scan (bone density measurement)  05/14/2024   Cologuard (Stool DNA test)  02/25/2025   Hepatitis C Screening: USPSTF Recommendation to screen - Ages 8518-79 yo.  Completed   HPV Vaccine  Aged Out   DTaP/Tdap/Td  vaccine  Discontinued  *Topic was postponed. The date shown is not the original due date.    Advanced directives: Advance directive discussed with you today. I have provided a copy for you to complete at home and have notarized. Once this is complete please bring a copy in to our office so we can scan it into your chart.   Conditions/risks identified: Aim for 30 minutes of exercise or brisk walking, 6-8 glasses of water, and 5 servings of fruits and vegetables each day.   Next appointment: Follow up in one year for your annual wellness visit    Preventive Care 65 Years and Older, Female Preventive care refers to lifestyle choices and visits with your health care provider that can promote health and wellness. What does preventive care include? A yearly physical exam. This is also called an annual well check. Dental exams once or twice a year. Routine eye exams. Ask your health care provider how often you should have your eyes checked. Personal lifestyle choices, including: Daily care of your teeth and gums. Regular physical activity. Eating a healthy diet. Avoiding tobacco and drug use. Limiting alcohol use. Practicing safe sex. Taking low-dose aspirin every day. Taking vitamin and mineral supplements as recommended by your health care provider. What happens during an annual well check? The services and screenings done by your health care provider during your annual well check will depend on your age, overall health, lifestyle risk factors, and family history of disease. Counseling  Your health care provider may ask you questions about your: Alcohol use. Tobacco use. Drug use. Emotional well-being. Home and relationship well-being. Sexual activity. Eating habits. History of falls. Memory and ability to understand (cognition). Work and work Astronomerenvironment. Reproductive health. Screening  You may have the following tests or measurements: Height, weight, and BMI. Blood  pressure. Lipid and cholesterol levels. These may be checked every 5 years, or more frequently if you are over 71 years old. Skin check. Lung cancer screening. You may have this screening every year starting at age 71 if you have a 30-pack-year history of smoking and currently smoke or have quit within the past 15 years. Fecal occult blood test (FOBT) of the stool. You may have this test every year starting at age 71. Flexible sigmoidoscopy or colonoscopy. You may have a sigmoidoscopy every 5 years or a colonoscopy every 10 years starting at age 71. Hepatitis C blood test. Hepatitis B blood test. Sexually transmitted disease (STD) testing. Diabetes screening. This is done by checking your blood sugar (glucose) after you have not eaten for a while (fasting). You may have this done every 1-3 years. Bone density scan. This is done to screen for osteoporosis. You may have this done starting at age 71. Mammogram. This may be done every 1-2 years. Talk to your health care provider about how often you should have regular mammograms. Talk with your health care provider about your test results, treatment options, and if necessary, the need for more tests. Vaccines  Your health care provider may recommend certain vaccines, such as: Influenza vaccine. This is recommended every year. Tetanus, diphtheria, and acellular pertussis (Tdap, Td) vaccine. You may need a Td booster every 10 years. Zoster vaccine. You may need this after age 71. Pneumococcal 13-valent conjugate (PCV13) vaccine. One dose is  recommended after age 24. Pneumococcal polysaccharide (PPSV23) vaccine. One dose is recommended after age 16. Talk to your health care provider about which screenings and vaccines you need and how often you need them. This information is not intended to replace advice given to you by your health care provider. Make sure you discuss any questions you have with your health care provider. Document Released:  11/28/2015 Document Revised: 07/21/2016 Document Reviewed: 09/02/2015 Elsevier Interactive Patient Education  2017 [employer].  Fall Prevention in the Home Falls can cause injuries. They can happen to people of all ages. There are many things you can do to make your home safe and to help prevent falls. What can I do on the outside of my home? Regularly fix the edges of walkways and driveways and fix any cracks. Remove anything that might make you trip as you walk through a door, such as a raised step or threshold. Trim any bushes or trees on the path to your home. Use bright outdoor lighting. Clear any walking paths of anything that might make someone trip, such as rocks or tools. Regularly check to see if handrails are loose or broken. Make sure that both sides of any steps have handrails. Any raised decks and porches should have guardrails on the edges. Have any leaves, snow, or ice cleared regularly. Use sand or salt on walking paths during winter. Clean up any spills in your garage right away. This includes oil or grease spills. What can I do in the bathroom? Use night lights. Install grab bars by the toilet and in the tub and shower. Do not use towel bars as grab bars. Use non-skid mats or decals in the tub or shower. If you need to sit down in the shower, use a plastic, non-slip stool. Keep the floor dry. Clean up any water that spills on the floor as soon as it happens. Remove soap buildup in the tub or shower regularly. Attach bath mats securely with double-sided non-slip rug tape. Do not have throw rugs and other things on the floor that can make you trip. What can I do in the bedroom? Use night lights. Make sure that you have a light by your bed that is easy to reach. Do not use any sheets or blankets that are too big for your bed. They should not hang down onto the floor. Have a firm chair that has side arms. You can use this for support while you get dressed. Do not have  throw rugs and other things on the floor that can make you trip. What can I do in the kitchen? Clean up any spills right away. Avoid walking on wet floors. Keep items that you use a lot in easy-to-reach places. If you need to reach something above you, use a strong step stool that has a grab bar. Keep electrical cords out of the way. Do not use floor polish or wax that makes floors slippery. If you must use wax, use non-skid floor wax. Do not have throw rugs and other things on the floor that can make you trip. What can I do with my stairs? Do not leave any items on the stairs. Make sure that there are handrails on both sides of the stairs and use them. Fix handrails that are broken or loose. Make sure that handrails are as long as the stairways. Check any carpeting to make sure that it is firmly attached to the stairs. Fix any carpet that is loose or worn. Avoid having  throw rugs at the top or bottom of the stairs. If you do have throw rugs, attach them to the floor with carpet tape. Make sure that you have a light switch at the top of the stairs and the bottom of the stairs. If you do not have them, ask someone to add them for you. What else can I do to help prevent falls? Wear shoes that: Do not have high heels. Have rubber bottoms. Are comfortable and fit you well. Are closed at the toe. Do not wear sandals. If you use a stepladder: Make sure that it is fully opened. Do not climb a closed stepladder. Make sure that both sides of the stepladder are locked into place. Ask someone to hold it for you, if possible. Clearly mark and make sure that you can see: Any grab bars or handrails. First and last steps. Where the edge of each step is. Use tools that help you move around (mobility aids) if they are needed. These include: Canes. Walkers. Scooters. Crutches. Turn on the lights when you go into a dark area. Replace any light bulbs as soon as they burn out. Set up your furniture so  you have a clear path. Avoid moving your furniture around. If any of your floors are uneven, fix them. If there are any pets around you, be aware of where they are. Review your medicines with your doctor. Some medicines can make you feel dizzy. This can increase your chance of falling. Ask your doctor what other things that you can do to help prevent falls. This information is not intended to replace advice given to you by your health care provider. Make sure you discuss any questions you have with your health care provider. Document Released: 08/28/2009 Document Revised: 04/08/2016 Document Reviewed: 12/06/2014 Elsevier Interactive Patient Education  2017 [employer].

## 2023-02-22 NOTE — Progress Notes (Signed)
Subjective:   Erica Evans is a 72 y.o. female who presents for Medicare Annual (Subsequent) preventive examination. I connected with  [name] on 02/22/23 by a audio enabled telemedicine application and verified that I am speaking with the correct person using two identifiers.  Patient Location: Home  Provider Location: Home Office  I discussed the limitations of evaluation and management by telemedicine. The patient expressed understanding and agreed to proceed.  Review of Systems     Cardiac Risk Factors include: advanced age (>32men, >60 women);diabetes mellitus;hypertension;dyslipidemia     Objective:    Today's Vitals   02/22/23 0845  Weight: 198 lb (89.8 kg)  Height: 5\' 7"  (1.702 m)   Body mass index is 31.01 kg/m.     02/22/2023    8:51 AM 12/16/2022    6:01 AM 12/13/2022   11:43 AM 02/18/2022    9:30 AM 02/16/2021    9:41 AM 02/14/2020    9:47 AM 05/23/2017   10:06 AM  Advanced Directives  Does Patient Have a Medical Advance Directive? No Yes Yes Yes Yes Yes Yes  Type of Advance Directive  Living will Living will Healthcare Power of [place];Living will Healthcare Power of [place];Living will Healthcare Power of [place];Living will;Out of facility DNR (pink MOST or yellow form)   Does patient want to make changes to medical advance directive?  No - Patient declined No - Patient declined      Copy of Healthcare Power of Attorney in Chart?    No - copy requested No - copy requested    Would patient like information on creating a medical advance directive? No - Patient declined          Current Medications (verified) Outpatient Encounter Medications as of 02/22/2023  Medication Sig   amLODipine (NORVASC) 10 MG tablet TAKE 1 TABLET BY MOUTH EVERY DAY   ASPIRIN EC PO Take 325 mg by mouth at bedtime. Swallow whole.   blood glucose meter kit and supplies Check BS once daily   busPIRone (BUSPAR) 15 MG tablet Take 1 tablet (15 mg total) by mouth 3 (three)  times daily. (Patient taking differently: Take 7.5 mg by mouth 3 (three) times daily as needed (anxiety).)   Cholecalciferol (D-3-5) 125 MCG (5000 UT) capsule Take 5,000 Units by mouth in the morning.   clopidogrel (PLAVIX) 75 MG tablet Take 1 tablet (75 mg total) by mouth daily.   cyclobenzaprine (FLEXERIL) 5 MG tablet TAKE 1 TABLET BY MOUTH EVERYDAY AT BEDTIME   glipiZIDE (GLUCOTROL XL) 10 MG 24 hr tablet TAKE 1 TABLET (10 MG TOTAL) BY MOUTH DAILY WITH BREAKFAST.   hydrochlorothiazide (HYDRODIURIL) 25 MG tablet TAKE 1 TABLET (25 MG TOTAL) BY MOUTH DAILY.   lidocaine (LIDODERM) 5 % Place 1 patch onto the skin daily. Remove & Discard patch within 12 hours or as directed by MD   metFORMIN (GLUCOPHAGE-XR) 500 MG 24 hr tablet Take 2 tablets (1,000 mg total) by mouth daily with supper.   ONETOUCH VERIO test strip USE TO TEST BLOOD SUGAR ONCE DAILY DX E11.9   oxyCODONE-acetaminophen (PERCOCET) 5-325 MG tablet Take 1 tablet by mouth every 6 (six) hours as needed for severe pain.   pioglitazone (ACTOS) 15 MG tablet Take 1 tablet (15 mg total) by mouth daily.   pravastatin (PRAVACHOL) 40 MG tablet Take 1 tablet (40 mg total) by mouth daily. (Patient taking differently: Take 40 mg by mouth at bedtime.)   Semaglutide (RYBELSUS) 14 MG TABS Take 1  tablet (14 mg total) by mouth daily. Via [employer] Patient Assistance Program   empagliflozin (JARDIANCE) 25 MG TABS tablet Take 1 tablet (25 mg total) by mouth daily. (Patient not taking: Reported on 02/22/2023)   No facility-administered encounter medications on file as of 02/22/2023.    Allergies (verified) Lipitor [atorvastatin], Morphine and related, and Farxiga [dapagliflozin]   History: Past Medical History:  Diagnosis Date   Arthritis    COVID-19 2022   Diabetes mellitus without complication    GERD (gastroesophageal reflux disease)    Hyperlipidemia    Hypertension    Palpitations    Vertigo    Past Surgical History:  Procedure Laterality Date    ENDARTERECTOMY Right 12/16/2022   Procedure: RIGHT CAROTID ENDARTERECTOMY;  Surgeon: Victorino Sparrowobins, Joshua E, MD;  [facility];  Service: Vascular;  Laterality: Right;   Family History  Problem Relation Age of Onset   Coronary artery disease Mother    Diabetes Mother    Heart disease Mother    Coronary artery disease Father    Heart disease Father    Diabetes Sister    Hypertension Sister    Heart disease Sister    Diabetes Brother    Diabetes Sister    Arthritis Sister    Hypertension Sister    Diabetes Sister    Hypertension Sister    Hypertension Sister    Social History   Socioeconomic History   Marital status: Married    Spouse name: [name]   Number of children: 2   Years of education: Not on file   Highest education level: Associate degree: academic program  Occupational History   Occupation: Geologist, engineeringteacher assistant    Comment: retired  Tobacco Use   Smoking status: Former    Types: Cigarettes    Quit date: 1990    Years since quitting: 34.2   Smokeless tobacco: Never  Vaping Use   Vaping Use: Never used  Substance and Sexual Activity   Alcohol use: No   Drug use: No   Sexual activity: Not on file  Other Topics Concern   Not on file  Social History Narrative   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 She lives at home with her husband [name] and her grown son. She has 3 children. She enjoys bird watching and walking.    Social Determinants of Health   Financial Resource Strain: Low Risk  (02/22/2023)   Overall Financial Resource Strain (CARDIA)    Difficulty of Paying Living Expenses: Not hard at all  Food Insecurity: No Food Insecurity (02/22/2023)   Hunger Vital Sign    Worried About Running Out of Food in the Last Year: Never true    Ran Out of Food in the Last Year: Never true  Transportation Needs: No Transportation Needs (02/22/2023)   PRAPARE - Administrator, Civil ServiceTransportation    Lack of Transportation (Medical): No    Lack of Transportation (Non-Medical): No  Physical Activity:  Insufficiently Active (02/22/2023)   Exercise Vital Sign    Days of Exercise per Week: 3 days    Minutes of Exercise per Session: 30 min  Stress: No Stress Concern Present (02/22/2023)   [employer] - Occupational Stress Questionnaire    Feeling of Stress : Not at all  Social Connections: Moderately Integrated (02/22/2023)   Social Connection and Isolation Panel [NHANES]    Frequency of Communication with Friends and Family: More than three times a week    Frequency of Social Gatherings with Friends and Family:  More than three times a week    Attends Religious Services: More than 4 times per year    Active Member of Clubs or Organizations: No    Attends Banker Meetings: Never    Marital Status: Married    Tobacco Counseling Counseling given: Not Answered   Clinical Intake:  Pre-visit preparation completed: Yes  Pain : No/denies pain     Nutritional Risks: None Diabetes: Yes CBG done?: No Did pt. bring in CBG monitor from home?: No  How often do you need to have someone help you when you read instructions, pamphlets, or other written materials from your doctor or pharmacy?: 1 - Never  Diabetic?yes Nutrition Risk Assessment:  Has the patient had any N/V/D within the last 2 months?  No  Does the patient have any non-healing wounds?  No  Has the patient had any unintentional weight loss or weight gain?  No   Diabetes:  Is the patient diabetic?  Yes  If diabetic, was a CBG obtained today?  No  Did the patient bring in their glucometer from home?  No  How often do you monitor your CBG's? 2 x day .   Financial Strains and Diabetes Management:  Are you having any financial strains with the device, your supplies or your medication? No .  Does the patient want to be seen by Chronic Care Management for management of their diabetes?  No  Would the patient like to be referred to a Nutritionist or for Diabetic Management?  No   Diabetic  Exams:  Diabetic Eye Exam: Completed 09/2022 Diabetic Foot Exam: Overdue, Pt has been advised about the importance in completing this exam. Pt is scheduled for diabetic foot exam on next office visit .   Interpreter Needed?: No  Information entered by :: Renie Ora, LPN   Activities of Daily Living    02/22/2023    8:51 AM 12/13/2022   11:46 AM  In your present state of health, do you have any difficulty performing the following activities:  Hearing? 0   Vision? 0   Difficulty concentrating or making decisions? 0   Walking or climbing stairs? 0   Dressing or bathing? 0   Doing errands, shopping? 0 0  Preparing Food and eating ? N   Using the Toilet? N   In the past six months, have you accidently leaked urine? N   Do you have problems with loss of bowel control? N   Managing your Medications? N   Managing your Finances? N   Housekeeping or managing your Housekeeping? N     Patient Care Team: Raliegh Ip, DO as PCP - General (Family Medicine) Marcelino Duster, MD as Referring Physician (Dermatology) Desiree Lucy, OD (Optometry) Cresenciano Genre Lilla Shook, [facility] as Pharmacist (Family Medicine) Adam Phenix, DPM as Consulting Physician (Podiatry)  Indicate any recent Medical Services you may have received from other than [facility] providers in the past year (date may be approximate).     Assessment:   This is a routine wellness examination for Madia.  Hearing/Vision screen Vision Screening - Comments:: Wears rx glasses - up to date with routine eye exams with  Dr.Davis   Dietary issues and exercise activities discussed: Current Exercise Habits: Home exercise routine, Type of exercise: walking, Time (Minutes): 30, Frequency (Times/Week): 3, Weekly Exercise (Minutes/Week): 90, Intensity: Mild, Exercise limited by: orthopedic condition(s)   Goals Addressed             This Visit's Progress  Patient Stated   On track    02/18/2022  AWV Goal: Exercise for General  Health  Patient will verbalize understanding of the benefits of increased physical activity: Exercising regularly is important. It will improve your overall fitness, flexibility, and endurance. Regular exercise also will improve your overall health. It can help you control your weight, reduce stress, and improve your bone density. Over the next year, patient will increase physical activity as tolerated with a goal of at least 150 minutes of moderate physical activity per week.  You can tell that you are exercising at a moderate intensity if your heart starts beating faster and you start breathing faster but can still hold a conversation. Moderate-intensity exercise ideas include: Walking 1 mile (1.6 km) in about 15 minutes Biking Hiking Golfing Dancing Water aerobics Patient will verbalize understanding of everyday activities that increase physical activity by providing examples like the following: Yard work, such as: Insurance underwriter Gardening Washing windows or floors Patient will be able to explain general safety guidelines for exercising:  Before you start a new exercise program, talk with your health care provider. Do not exercise so much that you hurt yourself, feel dizzy, or get very short of breath. Wear comfortable clothes and wear shoes with good support. Drink plenty of water while you exercise to prevent dehydration or heat stroke. Work out until your breathing and your heartbeat get faster.        Depression Screen    02/22/2023    8:50 AM 02/07/2023    8:42 AM 12/06/2022    2:06 PM 05/12/2022    9:27 AM 02/18/2022    9:18 AM 01/05/2022    8:41 AM 09/15/2021    8:53 AM  PHQ 2/9 Scores  PHQ - 2 Score 0 0 0 0 1 1 0  PHQ- 9 Score 0 0 0 0 3 2 1     Fall Risk    02/22/2023    8:47 AM 02/07/2023    8:42 AM 12/06/2022    2:05 PM 05/12/2022    9:27 AM 02/18/2022    9:15 AM  Fall Risk   Falls  in the past year? 0 0 0 0 0  Number falls in past yr: 0 0 0  0  Injury with Fall? 0 0 0  0  Risk for fall due to : No Fall Risks No Fall Risks History of fall(s)  Orthopedic patient  Follow up Falls prevention discussed Education provided Education provided  Falls prevention discussed    FALL RISK PREVENTION PERTAINING TO THE HOME:  Any stairs in or around the home? No  If so, are there any without handrails? No  Home free of loose throw rugs in walkways, pet beds, electrical cords, etc? Yes  Adequate lighting in your home to reduce risk of falls? Yes   ASSISTIVE DEVICES UTILIZED TO PREVENT FALLS:  Life alert? No  Use of a cane, walker or w/c? No  Grab bars in the bathroom? Yes  Shower chair or bench in shower? Yes  Elevated toilet seat or a handicapped toilet? Yes        02/22/2023    8:51 AM 02/18/2022    9:25 AM 02/16/2021    9:46 AM 02/14/2020    9:52 AM  6CIT Screen  What Year? 0 points 0 points 0 points 0 points  What month? 0 points 0 points 0 points 0 points  What  time? 0 points 0 points 0 points 0 points  Count back from 20 0 points 0 points 0 points 0 points  Months in reverse 0 points 0 points 0 points 0 points  Repeat phrase 0 points 0 points 0 points 0 points  Total Score 0 points 0 points 0 points 0 points    Immunizations Immunization History  Administered Date(s) Administered   Fluad Quad(high Dose 65+) 08/15/2019, 08/29/2020, 08/17/2021, 09/08/2022   Influenza Inj Mdck Quad Pf 09/01/2017   Influenza Split 09/21/2013   Influenza, High Dose Seasonal PF 08/24/2018   Influenza,inj,Quad PF,6+ Mos 09/19/2014, 09/03/2015, 08/04/2016   Influenza-Unspecified 08/30/2017   Moderna Covid-19 Vaccine Bivalent Booster 35yrs & up 10/14/2021   Moderna SARS-COV2 Booster Vaccination 06/17/2021   Moderna Sars-Covid-2 Vaccination 01/09/2020, 02/06/2020, 10/01/2020   Pneumococcal Conjugate-13 10/20/2012, 10/17/2018   Pneumococcal Polysaccharide-23 10/20/2012   Tdap 07/06/2011     TDAP status: Due, Education has been provided regarding the importance of this vaccine. Advised may receive this vaccine at local pharmacy or Health Dept. Aware to provide a copy of the vaccination record if obtained from local pharmacy or Health Dept. Verbalized acceptance and understanding.  Flu Vaccine status: Up to date  Pneumococcal vaccine status: Up to date  Covid-19 vaccine status: Completed vaccines  Qualifies for Shingles Vaccine? Yes   Zostavax completed No   Shingrix Completed?: No.    Education has been provided regarding the importance of this vaccine. Patient has been advised to call insurance company to determine out of pocket expense if they have not yet received this vaccine. Advised may also receive vaccine at local pharmacy or Health Dept. Verbalized acceptance and understanding.  Screening Tests Health Maintenance  Topic Date Due   COVID-19 Vaccine (6 - 2023-24 season) 07/16/2022   Zoster Vaccines- Shingrix (1 of 2) 03/07/2023 (Originally 09/10/2002)   MAMMOGRAM  11/10/2023 (Originally 09/07/2022)   Diabetic kidney evaluation - Urine ACR  05/13/2023   HEMOGLOBIN A1C  06/06/2023   INFLUENZA VACCINE  06/16/2023   Pneumonia Vaccine 7+ Years old (3 of 3 - PPSV23 or PCV20) 10/18/2023   OPHTHALMOLOGY EXAM  11/19/2023   FOOT EXAM  12/07/2023   Diabetic kidney evaluation - eGFR measurement  12/18/2023   Medicare Annual Wellness (AWV)  02/22/2024   DEXA SCAN  05/14/2024   Fecal DNA (Cologuard)  02/25/2025   Hepatitis C Screening  Completed   HPV VACCINES  Aged Out   DTaP/Tdap/Td  Discontinued    Health Maintenance  Health Maintenance Due  Topic Date Due   COVID-19 Vaccine (6 - 2023-24 season) 07/16/2022    Colorectal cancer screening: Type of screening: Cologuard. Completed 02/25/2022. Repeat every 3 years  Mammogram status: Ordered 02/22/2023. Pt provided with contact info and advised to call to schedule appt.   Bone Density status: Completed  05/14/2022. Results reflect: Bone density results: OSTEOPOROSIS. Repeat every 2 years.  Lung Cancer Screening: (Low Dose CT Chest recommended if Age 31-80 years, 30 pack-year currently smoking OR have quit w/in 15years.) does not qualify.   Lung Cancer Screening Referral: n/a  Additional Screening:  Hepatitis C Screening: does not qualify; Completed 06/26/2007  Vision Screening: Recommended annual ophthalmology exams for early detection of glaucoma and other disorders of the eye. Is the patient up to date with their annual eye exam?  Yes  Who is the provider or what is the name of the office in which the patient attends annual eye exams? Dr.Davis  If pt is not established with a provider, would  they like to be referred to a provider to establish care? No .   Dental Screening: Recommended annual dental exams for proper oral hygiene  Community Resource Referral / Chronic Care Management: CRR required this visit?  No   CCM required this visit?  No      Plan:     I have personally reviewed and noted the following in the patient's chart:   Medical and social history Use of alcohol, tobacco or illicit drugs  Current medications and supplements including opioid prescriptions. Patient is not currently taking opioid prescriptions. Functional ability and status Nutritional status Physical activity Advanced directives List of other physicians Hospitalizations, surgeries, and ER visits in previous 12 months Vitals Screenings to include cognitive, depression, and falls Referrals and appointments  In addition, I have reviewed and discussed with patient certain preventive protocols, quality metrics, and best practice recommendations. A written personalized care plan for preventive services as well as general preventive health recommendations were provided to patient.     Lorrene Reid, LPN   11/20/1094   Nurse Notes: Due Tdap Vaccine

## 2023-03-05 ENCOUNTER — Other Ambulatory Visit: Payer: Self-pay | Admitting: Family Medicine

## 2023-03-05 DIAGNOSIS — I152 Hypertension secondary to endocrine disorders: Secondary | ICD-10-CM

## 2023-03-11 NOTE — Patient Instructions (Incomplete)
STOP pioglitizone CONTINUE Rybelsus 14mg  ONE per day until gone You may then start Ozempic injected 0.25mg  into the skin every 7 days. Raynelle Fanning will work on getting that through patient assistance and we will increase the dose once we know when it will be shipped    Our records indicate that you are due for your screening mammogram. Your primary care provider has placed order in for your screening mammogram. Please contact [facility] at [phone] at your earliest convenience to schedule your appointment.  If you are unable to make an appointment at this time, give [place] a call so we can update our records.

## 2023-03-13 ENCOUNTER — Other Ambulatory Visit: Payer: Self-pay | Admitting: Family Medicine

## 2023-03-13 DIAGNOSIS — E1165 Type 2 diabetes mellitus with hyperglycemia: Secondary | ICD-10-CM

## 2023-03-15 ENCOUNTER — Ambulatory Visit (INDEPENDENT_AMBULATORY_CARE_PROVIDER_SITE_OTHER): Payer: Medicare HMO | Admitting: Family Medicine

## 2023-03-15 ENCOUNTER — Encounter: Payer: Self-pay | Admitting: Family Medicine

## 2023-03-15 VITALS — BP 130/65 | HR 71 | Temp 98.7°F | Ht 67.0 in | Wt 204.0 lb

## 2023-03-15 DIAGNOSIS — E876 Hypokalemia: Secondary | ICD-10-CM

## 2023-03-15 DIAGNOSIS — I152 Hypertension secondary to endocrine disorders: Secondary | ICD-10-CM

## 2023-03-15 DIAGNOSIS — E785 Hyperlipidemia, unspecified: Secondary | ICD-10-CM | POA: Diagnosis not present

## 2023-03-15 DIAGNOSIS — E1159 Type 2 diabetes mellitus with other circulatory complications: Secondary | ICD-10-CM

## 2023-03-15 DIAGNOSIS — E1169 Type 2 diabetes mellitus with other specified complication: Secondary | ICD-10-CM

## 2023-03-15 LAB — BAYER DCA HB A1C WAIVED: HB A1C (BAYER DCA - WAIVED): 7.2 % — ABNORMAL HIGH (ref 4.8–5.6)

## 2023-03-15 MED ORDER — OZEMPIC (0.25 OR 0.5 MG/DOSE) 2 MG/3ML ~~LOC~~ SOPN: 0.5000 mg | PEN_INJECTOR | SUBCUTANEOUS | 3 refills | Status: DC

## 2023-03-15 NOTE — Progress Notes (Signed)
Subjective: CC:DM PCP: Erica Evans MVH:QIONG [name] Erica Evans is a 72 y.o. female presenting to clinic today for:  1. Type 2 Diabetes with hypertension, hyperlipidemia:  Compliant with Rybelsus 14mg  daily and newly added Actos 15mg .  She reports peripheral edema and weight gain today.  She also reports she accidentally was taking 28mg  of the Ryelsus for a bit before she realized that the bottle said take 1 capsule.  She denies abdominal pain, nausea or vomiting.    Last eye exam: UTD Last foot exam: UTD Last A1c:  Lab Results  Component Value Date   HGBA1C 7.7 (H) 12/06/2022   Nephropathy screen indicated?: UTD Last flu, zoster and/or pneumovax:  Immunization History  Administered Date(s) Administered   Fluad Quad(high Dose 65+) 08/15/2019, 08/29/2020, 08/17/2021, 09/08/2022   Influenza Inj Mdck Quad Pf 09/01/2017   Influenza Split 09/21/2013   Influenza, High Dose Seasonal PF 08/24/2018   Influenza,inj,Quad PF,6+ Mos 09/19/2014, 09/03/2015, 08/04/2016   Influenza-Unspecified 08/30/2017   Moderna Covid-19 Vaccine Bivalent Booster 79yrs & up 10/14/2021   Moderna SARS-COV2 Booster Vaccination 06/17/2021   Moderna Sars-Covid-2 Vaccination 01/09/2020, 02/06/2020, 10/01/2020   Pneumococcal Conjugate-13 10/20/2012, 10/17/2018   Pneumococcal Polysaccharide-23 10/20/2012   Tdap 07/06/2011    ROS: Per HPI  Allergies  Allergen Reactions   Lipitor [Atorvastatin]     Myopathy/fibromyalgia    Morphine And Related    Farxiga [Dapagliflozin]     UTI/CONSTIPATION TOLERATES JARDIANCE   Past Medical History:  Diagnosis Date   Arthritis    COVID-19 2022   Diabetes mellitus without complication (HCC)    GERD (gastroesophageal reflux disease)    Hyperlipidemia    Hypertension    Palpitations    Vertigo     Current Outpatient Medications:    amLODipine (NORVASC) 10 MG tablet, TAKE 1 TABLET BY MOUTH EVERY DAY, Disp: 90 tablet, Rfl: 0   ASPIRIN EC PO, Take 325 mg by  mouth at bedtime. Swallow whole., Disp: , Rfl:    blood glucose meter kit and supplies, Check BS once daily, Disp: 1 each, Rfl: 0   busPIRone (BUSPAR) 15 MG tablet, Take 1 tablet (15 mg total) by mouth 3 (three) times daily. (Patient taking differently: Take 7.5 mg by mouth 3 (three) times daily as needed (anxiety).), Disp: 270 tablet, Rfl: 3   Cholecalciferol (D-3-5) 125 MCG (5000 UT) capsule, Take 5,000 Units by mouth in the morning., Disp: , Rfl:    clopidogrel (PLAVIX) 75 MG tablet, Take 1 tablet (75 mg total) by mouth daily., Disp: 30 tablet, Rfl: 6   cyclobenzaprine (FLEXERIL) 5 MG tablet, TAKE 1 TABLET BY MOUTH EVERYDAY AT BEDTIME, Disp: 30 tablet, Rfl: 2   empagliflozin (JARDIANCE) 25 MG TABS tablet, Take 1 tablet (25 mg total) by mouth daily. (Patient not taking: Reported on 02/22/2023), Disp: 90 tablet, Rfl: 3   glipiZIDE (GLUCOTROL XL) 10 MG 24 hr tablet, TAKE 1 TABLET (10 MG TOTAL) BY MOUTH DAILY WITH BREAKFAST., Disp: 90 tablet, Rfl: 0   hydrochlorothiazide (HYDRODIURIL) 25 MG tablet, TAKE 1 TABLET (25 MG TOTAL) BY MOUTH DAILY., Disp: 30 tablet, Rfl: 5   lidocaine (LIDODERM) 5 %, Place 1 patch onto the skin daily. Remove & Discard patch within 12 hours or as directed by MD, Disp: 30 patch, Rfl: 0   metFORMIN (GLUCOPHAGE-XR) 500 MG 24 hr tablet, Take 2 tablets (1,000 mg total) by mouth daily with supper., Disp: 180 tablet, Rfl: 3   ONETOUCH VERIO test strip, USE TO TEST BLOOD SUGAR  ONCE DAILY DX E11.9, Disp: 100 strip, Rfl: 3   oxyCODONE-acetaminophen (PERCOCET) 5-325 MG tablet, Take 1 tablet by mouth every 6 (six) hours as needed for severe pain., Disp: 8 tablet, Rfl: 0   pioglitazone (ACTOS) 15 MG tablet, Take 1 tablet (15 mg total) by mouth daily., Disp: 90 tablet, Rfl: 3   pravastatin (PRAVACHOL) 40 MG tablet, Take 1 tablet (40 mg total) by mouth daily. (Patient taking differently: Take 40 mg by mouth at bedtime.), Disp: 90 tablet, Rfl: 3   Semaglutide (RYBELSUS) 14 MG TABS, Take 1  tablet (14 mg total) by mouth daily. Via [employer] Patient Assistance Program, Disp: 30 tablet, Rfl: 4 Social History   Socioeconomic History   Marital status: Married    Spouse name: Erica Evans   Number of children: 2   Years of education: Not on file   Highest education level: Associate degree: academic program  Occupational History   Occupation: Geologist, engineering    Comment: retired  Tobacco Use   Smoking status: Former    Types: Cigarettes    Quit date: 1990    Years since quitting: 34.3   Smokeless tobacco: Never  Vaping Use   Vaping Use: Never used  Substance and Sexual Activity   Alcohol use: No   Drug use: No   Sexual activity: Not on file  Other Topics Concern   Not on file  Social History Narrative   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 She has 3 children. She enjoys bird watching and walking.    Social Determinants of Health   Financial Resource Strain: Low Risk  (02/22/2023)   Overall Financial Resource Strain (CARDIA)    Difficulty of Paying Living Expenses: Not hard at all  Food Insecurity: No Food Insecurity (02/22/2023)   Hunger Vital Sign    Worried About Running Out of Food in the Last Year: Never true    Ran Out of Food in the Last Year: Never true  Transportation Needs: No Transportation Needs (02/22/2023)   PRAPARE - Administrator, Civil Service (Medical): No    Lack of Transportation (Non-Medical): No  Physical Activity: Insufficiently Active (02/22/2023)   Exercise Vital Sign    Days of Exercise per Week: 3 days    Minutes of Exercise per Session: 30 min  Stress: No Stress Concern Present (02/22/2023)   [employer] of Occupational Health - Occupational Stress Questionnaire    Feeling of Stress : Not at all  Social Connections: Moderately Integrated (02/22/2023)   Social Connection and Isolation Panel [NHANES]    Frequency of Communication with Friends and Family: More than three  times a week    Frequency of Social Gatherings with Friends and Family: More than three times a week    Attends Religious Services: More than 4 times per year    Active Member of [employer] or Organizations: No    Attends Banker Meetings: Never    Marital Status: Married  Catering manager Violence: Not At Risk (02/22/2023)   Humiliation, Afraid, Rape, and Kick questionnaire    Fear of Current or Ex-Partner: No    Emotionally Abused: No    Physically Abused: No    Sexually Abused: No   Family History  Problem Relation Age of Onset   Coronary artery disease Mother    Diabetes Mother    Heart disease Mother    Coronary artery disease Father  Heart disease Father    Diabetes Sister    Hypertension Sister    Heart disease Sister    Diabetes Brother    Diabetes Sister    Arthritis Sister    Hypertension Sister    Diabetes Sister    Hypertension Sister    Hypertension Sister     Objective: Office vital signs reviewed. BP 130/65   Pulse 71   Temp 98.7 F (37.1 C)   Ht 5\' 7"  (1.702 m)   Wt 204 lb (92.5 kg)   SpO2 98%   BMI 31.95 kg/m   Physical Examination:  General: Awake, alert, well nourished, No acute distress HEENT: Mild Exotropia.  Sclera white.  Moist mucous membranes Cardio: regular rate and rhythm, S1S2 heard, no murmurs appreciated Pulm: clear to auscultation bilaterally, no wheezes, rhonchi or rales; normal work of breathing on room air  Assessment/ Plan: 72 y.o. female   Type 2 diabetes mellitus with other specified complication, without long-term current use of insulin (HCC) - Plan: Bayer DCA Hb A1c Waived, Semaglutide,0.25 or 0.5MG /DOS, (OZEMPIC, 0.25 OR 0.5 MG/DOSE,) 2 MG/3ML SOPN  Hyperlipidemia associated with type 2 diabetes mellitus (HCC)  Hypertension associated with diabetes (HCC)  Hypokalemia - Plan: Basic Metabolic Panel, Magnesium  Sugar not at goal with A1c of 7.2 but slightly better than previous checkup.  This may be because of  double dosing of Rybelsus.  I would like her to discontinue the Actos since she is having peripheral edema.  We will however go ahead and transition her over to injected therapy since she did tolerate these higher doses.  Start with 0.25 mg injected every week after she has completed her current Rybelsus supply.  I have given her 2 packages that should last her up for 2 additional months following completion of Rybelsus.  I will also CCing clinical pharmacy for patient assistance program for Ozempic and it sounds like the timeline is to be roughly 6 weeks before she receives.  I will see her in 3 months, sooner if concerns arise  Continue current medications for blood pressure, cholesterol  No orders of the defined types were placed in this encounter.  No orders of the defined types were placed in this encounter.    Erica Evans [facility] [phone]

## 2023-03-16 ENCOUNTER — Other Ambulatory Visit: Payer: Self-pay | Admitting: Pharmacist

## 2023-03-16 LAB — BASIC METABOLIC PANEL
BUN/Creatinine Ratio: 17 (ref 12–28)
BUN: 14 mg/dL (ref 8–27)
CO2: 24 mmol/L (ref 20–29)
Calcium: 10 mg/dL (ref 8.7–10.3)
Chloride: 93 mmol/L — ABNORMAL LOW (ref 96–106)
Creatinine, Ser: 0.81 mg/dL (ref 0.57–1.00)
Glucose: 104 mg/dL — ABNORMAL HIGH (ref 70–99)
Potassium: 5 mmol/L (ref 3.5–5.2)
Sodium: 134 mmol/L (ref 134–144)
eGFR: 78 mL/min/{1.73_m2} (ref >59)

## 2023-03-16 LAB — MAGNESIUM: Magnesium: 1.9 mg/dL (ref 1.6–2.3)

## 2023-03-16 NOTE — Progress Notes (Signed)
    03/16/2023 Name: Erica Evans MRN: [id number] DOB: 1951/03/23   Patient has been stable on Rybelsus 14mg  daily.  Will transition patient to Ozempic 0.5mg  sq weekly for ease of once weekly injection and titratable dosing.  Patient assistance dose change form to be submitted to [employer] Patient Assistance program.  Appreciate assistance of CPhT.  Sample given.  PharmD f/u in 4 weeks  Kieth Brightly, PharmD, BCACP Clinical Pharmacist, [facility]

## 2023-03-18 ENCOUNTER — Other Ambulatory Visit (HOSPITAL_COMMUNITY): Payer: Self-pay

## 2023-03-29 ENCOUNTER — Telehealth: Payer: Self-pay

## 2023-03-29 NOTE — Telephone Encounter (Signed)
Submitted application for OZEMPIC to [employer] for patient assistance. Re-enrollment/ med change.  Phone: [phone]

## 2023-04-02 ENCOUNTER — Other Ambulatory Visit: Payer: Self-pay | Admitting: Family Medicine

## 2023-04-02 DIAGNOSIS — I152 Hypertension secondary to endocrine disorders: Secondary | ICD-10-CM

## 2023-04-22 NOTE — Telephone Encounter (Signed)
Received notification from [employer] regarding approval for OZEMPIC. Patient assistance approved UNTIL 11/15/23.  MEDICATION SHIPS TO OFFICE  Phone: [phone]

## 2023-05-01 ENCOUNTER — Other Ambulatory Visit: Payer: Self-pay | Admitting: Family Medicine

## 2023-05-05 ENCOUNTER — Other Ambulatory Visit: Payer: Self-pay | Admitting: Pharmacist

## 2023-05-05 DIAGNOSIS — E1169 Type 2 diabetes mellitus with other specified complication: Secondary | ICD-10-CM

## 2023-05-05 NOTE — Progress Notes (Signed)
05/05/2023 Name: Erica Evans MRN: [id number] DOB: 03/15/1951  T2DM/medication assistance   Lendon Collar [name] is a 72 y.o. year old female who presented for a telephone visit. They were referred to the pharmacist by their PCP for assistance in managing diabetes and medication access.    Subjective:  Care Team: Primary Care Provider: Raliegh Ip, DO ; Next Scheduled Visit: 06/14/23  Medication Access/Adherence  Current Pharmacy:  [id number] [place] [place] - [address] AT [facility] [address] [place] [address] Phone: [phone] Fax: [phone]  Patient reports affordability concerns with their medications: Yes  Patient reports access/transportation concerns to their pharmacy: No  Patient reports adherence concerns with their medications:  Yes  only when costly;approved for med assistance  Diabetes:  Current medications: Ozempic, metformin, glipizide Medications tried in the past: Rybelsus, Farxiga, pioglitazone Denies personal and family history of Medullary thyroid cancer (MTC)  Current glucose readings: FBG<135 Using One Touch Verio meter; testing daily  Current physical activity: encouraged only as able and recommended by PCP  Current medication access support: Novo Nordisk PAP for Ozempic   Objective:  Lab Results  Component Value Date   HGBA1C 7.2 (H) 03/15/2023    Lab Results  Component Value Date   CREATININE 0.81 03/15/2023   BUN 14 03/15/2023   NA 134 03/15/2023   K 5.0 03/15/2023   CL 93 (L) 03/15/2023   CO2 24 03/15/2023    Lab Results  Component Value Date   CHOL 98 12/17/2022   HDL 40 (L) 12/17/2022   LDLCALC 30 12/17/2022   TRIG 141 12/17/2022   CHOLHDL 2.5 12/17/2022    Medications Reviewed Today     Reviewed by Waynette Buttery, CMA (Certified Medical Assistant) on 03/15/23 at 1114  Med List Status: <None>   Medication Order Taking? Sig Documenting Provider Last Dose  Status Informant  amLODipine (NORVASC) 10 MG tablet [id number] Yes TAKE 1 TABLET BY MOUTH EVERY DAY Raliegh Ip, DO Taking Active   ASPIRIN EC PO [id number] Yes Take 325 mg by mouth at bedtime. Swallow whole. [provider] Taking Active Self  blood glucose meter kit and supplies [id number] Yes Check BS once daily Elenora Gamma, MD Taking Active Self  busPIRone (BUSPAR) 15 MG tablet [id number] Yes Take 1 tablet (15 mg total) by mouth 3 (three) times daily.  Patient taking differently: Take 7.5 mg by mouth 3 (three) times daily as needed (anxiety).   Delynn Flavin M, DO Taking Active Self  Cholecalciferol (D-3-5) 125 MCG (5000 UT) capsule [id number] Yes Take 5,000 Units by mouth in the morning. [provider] Taking Active Self  clopidogrel (PLAVIX) 75 MG tablet [id number] Yes Take 1 tablet (75 mg total) by mouth daily. Victorino Sparrow, MD Taking Active   cyclobenzaprine (FLEXERIL) 5 MG tablet [id number] Yes TAKE 1 TABLET BY MOUTH EVERYDAY AT BEDTIME Delynn Flavin M, DO Taking Active   empagliflozin (JARDIANCE) 25 MG TABS tablet [id number] Yes Take 1 tablet (25 mg total) by mouth daily. Delynn Flavin M, DO Taking Active Self  glipiZIDE (GLUCOTROL XL) 10 MG 24 hr tablet [id number] Yes TAKE 1 TABLET (10 MG TOTAL) BY MOUTH DAILY WITH BREAKFAST. Delynn Flavin M, DO Taking Active   hydrochlorothiazide (HYDRODIURIL) 25 MG tablet [id number] Yes TAKE 1 TABLET (25 MG TOTAL) BY MOUTH DAILY. Delynn Flavin M, DO Taking Active   lidocaine (LIDODERM) 5 % [id number] Yes Place 1 patch onto the  skin daily. Remove & Discard patch within 12 hours or as directed by MD Raliegh Ip, DO Taking Active Self           Med Note Tiburcio Pea, Luvenia Redden   Wed Dec 08, 2022  2:11 PM) On hand (patient has not taken recently)  metFORMIN (GLUCOPHAGE-XR) 500 MG 24 hr tablet [id number] Yes Take 2 tablets (1,000 mg total) by mouth daily with supper. Delynn Flavin [place], DO Taking Active Self   ONETOUCH VERIO test strip [id number] Yes USE TO TEST BLOOD SUGAR ONCE DAILY DX E11.9 Delynn Flavin M, DO Taking Active Self  oxyCODONE-acetaminophen (PERCOCET) 5-325 MG tablet [id number] Yes Take 1 tablet by mouth every 6 (six) hours as needed for severe pain. Dara Lords, PA-C Taking Active   pioglitazone (ACTOS) 15 MG tablet [id number] Yes Take 1 tablet (15 mg total) by mouth daily. Delynn Flavin M, DO Taking Active   pravastatin (PRAVACHOL) 40 MG tablet [id number] Yes Take 1 tablet (40 mg total) by mouth daily.  Patient taking differently: Take 40 mg by mouth at bedtime.   Raliegh Ip, DO Taking Active Self  Semaglutide (RYBELSUS) 14 MG TABS [id number] Yes Take 1 tablet (14 mg total) by mouth daily. Via [employer] Patient Assistance Program [place], [place], [place] Taking Active Self            Will continue current regimen for T2DM-_>Ozempic, metformin, glipizide Will work to [employer] (go low/slow due to GI with PO semaglutide)  Denies personal and family history of Medullary thyroid cancer (MTC)  #4 boxes of Ozempic 0.5mg  is in fridge for pick up--patient notified Will aim to d/c glipizide Continue metformin; GFR 78 Confirm if patient is on Jardiance (unable to tolerate [place]) Confirm statin fills LF prava 40 for #90--code myopathy at next visit  Follow Up Plan: 05/13/23 with PharmD to make adjustments to T2DM regimen    Kieth Brightly, PharmD, BCACP Clinical Pharmacist, [facility]

## 2023-05-13 ENCOUNTER — Ambulatory Visit (INDEPENDENT_AMBULATORY_CARE_PROVIDER_SITE_OTHER): Payer: Medicare HMO | Admitting: Pharmacist

## 2023-05-13 ENCOUNTER — Encounter: Payer: Self-pay | Admitting: Pharmacist

## 2023-05-13 DIAGNOSIS — E1169 Type 2 diabetes mellitus with other specified complication: Secondary | ICD-10-CM

## 2023-05-13 NOTE — Progress Notes (Signed)
05/13/2023 Name: Iasia Carkhuff MRN: [id number] DOB: [birth year]  Type 2 Diabetes   Brenee Foody is a 72 y.o. year old female who presented for a telephone visit.   They were referred to the pharmacist by their PCP for assistance in managing diabetes and medication access. She received her new Ozempic shipment for Novo nordisk PAP and started 0.25mg  weekly this week.  She was previously on Rybelsus.  She denies side effects  Subjective:  Care Team: Primary Care Provider: Raliegh Ip, DO   Medication Access/Adherence  [facility]  [phone] [place], [place] - [address] [facility] [address] AT [facility] [address] [address] [place] [address] Phone: [phone] Fax: [phone]  [id number] - [place], [place] - [address] [place] [address] Phone: [phone] Fax: [phone]  Patient reports affordability concerns with their medications: Yes  Patient reports access/transportation concerns to their pharmacy: No  Patient reports adherence concerns with their medications:  No     Diabetes:   Current medications: Ozempic, metformin, glipizide Medications tried in the past: Rybelsus, dapagliflozin, pioglitazone, empagliflozin Denies personal and family history of Medullary thyroid cancer (MTC)   Current glucose readings: FBG<135 Using One Touch Verio meter; testing daily   Current physical activity: encouraged only as able and recommended by PCP   Current medication access support: Novo Nordisk PAP for Ozempic   Objective:  Lab Results  Component Value Date   HGBA1C 7.2 (H) 03/15/2023    Lab Results  Component Value Date   CREATININE 0.81 03/15/2023   BUN 14 03/15/2023   NA 134 03/15/2023   K 5.0 03/15/2023   CL 93 (L) 03/15/2023   CO2 24 03/15/2023    Lab Results  Component Value Date   CHOL 98 12/17/2022   HDL 40 (L) 12/17/2022   LDLCALC 30 12/17/2022   TRIG 141  12/17/2022   CHOLHDL 2.5 12/17/2022    Medications Reviewed Today     Reviewed by Danella Maiers, [facility] (Pharmacist) on 05/05/23 at 1015  Med List Status: <None>   Medication Order Taking? Sig Documenting Provider Last Dose Status Informant  amLODipine (NORVASC) 10 MG tablet [id number]  TAKE 1 TABLET BY MOUTH EVERY DAY Raliegh Ip, DO  Active   ASPIRIN EC PO [id number] No Take 325 mg by mouth at bedtime. Swallow whole. [provider] Taking Active Self  blood glucose meter kit and supplies [id number] No Check BS once daily Elenora Gamma, MD Taking Active Self  busPIRone (BUSPAR) 15 MG tablet [id number] No Take 1 tablet (15 mg total) by mouth 3 (three) times daily.  Patient taking differently: Take 7.5 mg by mouth 3 (three) times daily as needed (anxiety).   Delynn Flavin M, DO Taking Active Self  Cholecalciferol (D-3-5) 125 MCG (5000 UT) capsule [id number] No Take 5,000 Units by mouth in the morning. [provider] Taking Active Self  clopidogrel (PLAVIX) 75 MG tablet [id number] No Take 1 tablet (75 mg total) by mouth daily. Victorino Sparrow, MD Taking Active   cyclobenzaprine (FLEXERIL) 5 MG tablet [id number]  TAKE 1 TABLET BY MOUTH EVERYDAY AT BEDTIME Delynn Flavin M, DO  Active   glipiZIDE (GLUCOTROL XL) 10 MG 24 hr tablet [id number] No TAKE 1 TABLET (10 MG TOTAL) BY MOUTH DAILY WITH BREAKFAST. Delynn Flavin M, DO Taking Active   hydrochlorothiazide (HYDRODIURIL) 25 MG tablet [id number]  TAKE 1 TABLET (25 MG TOTAL) BY MOUTH  DAILY. Delynn Flavin M, DO  Active   lidocaine (LIDODERM) 5 % [id number] No Place 1 patch onto the skin daily. Remove & Discard patch within 12 hours or as directed by MD Raliegh Ip, DO Taking Active Self           Med Note Tiburcio Pea, Luvenia Redden   Wed Dec 08, 2022  2:11 PM) On hand (patient has not taken recently)  metFORMIN (GLUCOPHAGE-XR) 500 MG 24 hr tablet [id number] No Take 2 tablets (1,000 mg total) by mouth daily with  supper. Raliegh Ip, DO Taking Active Self  ONETOUCH VERIO test strip [id number] No USE TO TEST BLOOD SUGAR ONCE DAILY DX E11.9 Delynn Flavin M, DO Taking Active Self  oxyCODONE-acetaminophen (PERCOCET) 5-325 MG tablet [id number] No Take 1 tablet by mouth every 6 (six) hours as needed for severe pain. Dara Lords, PA-C Taking Active   pravastatin (PRAVACHOL) 40 MG tablet [id number] No Take 1 tablet (40 mg total) by mouth daily.  Patient taking differently: Take 40 mg by mouth at bedtime.   Delynn Flavin M, DO Taking Active Self  Semaglutide,0.25 or 0.5MG /DOS, (OZEMPIC, 0.25 OR 0.5 MG/DOSE,) 2 MG/3ML SOPN [id number]  Inject 0.5 mg into the skin every 7 (seven) days. PAP Delynn Flavin M, DO  Active              Assessment/Plan:   Diabetes: - Currently controlled - Reviewed long term cardiovascular and renal outcomes of uncontrolled blood sugar - Reviewed goal A1c, goal fasting, and goal 2 hour post prandial glucose - Recommend to  Increase Ozempic to 0.5mg  sq weekly--was taking rybelsus 28 mg total now sugar is increasing; increase in ozempic will help this Denies personal and family history of Medullary thyroid cancer (MTC) Continue glipizide/metformin Noted new SGLT2/actos intolerances - Recommend to check glucose daily (fasting) or if symptomatic    Follow Up Plan: PRN; encouraged patient to reach out  Kieth Brightly, PharmD, BCACP Clinical Pharmacist, [facility]

## 2023-06-14 ENCOUNTER — Ambulatory Visit: Payer: Medicare HMO | Admitting: Family Medicine

## 2023-06-15 ENCOUNTER — Other Ambulatory Visit: Payer: Self-pay | Admitting: Family Medicine

## 2023-06-15 DIAGNOSIS — E1165 Type 2 diabetes mellitus with hyperglycemia: Secondary | ICD-10-CM

## 2023-06-20 ENCOUNTER — Other Ambulatory Visit: Payer: Self-pay | Admitting: Vascular Surgery

## 2023-06-27 ENCOUNTER — Other Ambulatory Visit: Payer: Self-pay | Admitting: Family Medicine

## 2023-06-27 DIAGNOSIS — M545 Low back pain, unspecified: Secondary | ICD-10-CM

## 2023-06-30 ENCOUNTER — Other Ambulatory Visit: Payer: Self-pay | Admitting: Family Medicine

## 2023-07-07 ENCOUNTER — Telehealth: Payer: Self-pay | Admitting: Pharmacist

## 2023-07-07 ENCOUNTER — Telehealth: Payer: Self-pay | Admitting: Family Medicine

## 2023-07-07 ENCOUNTER — Other Ambulatory Visit: Payer: Self-pay | Admitting: Pharmacist

## 2023-07-07 NOTE — Progress Notes (Signed)
   07/07/2023 Name: Erica Evans MRN: [id number] DOB: Jan 23, 1951  Chief Complaint  Patient presents with   Diabetes   Patient scheduled for upcoming surgery on 07/15/23 and concerned about her elevated blood sugars.  She last injected Ozempic 0.5mg  on 07/06/23.  Her surgeon requested she hold the dose for next week.  Her fasting blood sugars remain around 180 on current regimen.  Recommended patient increase her metformin to 2 tablets twice daily in the meantime.  Her PCP follow up appt is on 07/12/23 where we will get her A1c result.  Will plan to increase her Ozempic to 1mg  weekly (likely decrease metformin back to once daily at that time)   Follow Up Plan: PCP f/u 07/12/23   Kieth Brightly, PharmD, BCACP Clinical Pharmacist, [facility]

## 2023-07-07 NOTE — Telephone Encounter (Signed)
Call returned see separate note

## 2023-07-07 NOTE — Telephone Encounter (Signed)
Can you please send me dose change form for novo nordisk -->increasing to ozempic 1mg  weekly  Thank you!

## 2023-07-08 NOTE — H&P (Signed)
Surgical History & Physical  Patient Name: Concepsion Mcalister  DOB: 1951/04/23  Surgery: Cataract extraction with intraocular lens implant phacoemulsification; Left Eye Surgeon: Fabio Pierce MD Surgery Date: 07/15/2023 Pre-Op Date: 07/04/2023  HPI: A 38 Yr. old female patient 1. The patient was referred to [place] from Dr. Earlene Plater for a cataract evaluation. The patient's vision is blurry. The condition's severity is constant. The complaint is associated with difficulty driving at night due to halos/glare, difficulty reading traffic/street signs, and difficulty with glare on bright sunny days. This is negatively affecting the patient's quality of life and the patient is unable to function adequately in life with the current level of vision. HPI was performed by Fabio Pierce .  Medical History: Cataracts Exotropia, left eye Bilateral hypertensive retinopathy Glaucoma Diabetes - DM Type 2 Heart Problem High Blood Pressure Hypertension  Review of Systems Negative Allergic/Immunologic Cardiovascular HTN, heart problems Negative Constitutional Negative Ear, Nose, Mouth & Throat Endocrine diabetic Eyes cataracts Negative Gastrointestinal Negative Genitourinary Negative Hemotologic/Lymphatic Negative Integumentary Negative Musculoskeletal Negative Neurological Negative Psychiatry Negative Respiratory  Social Never smoked  Medication pravastatin ,  glipizide ,  metformin (Glucophage XR) ,  hydrochlorothiazide ,  amlodipine ,  doxycycline hyclate ,  buspirone ,  cyclobenzaprine ,  OneTouch Verio test strips   Sx/Procedures Blockage removed neck  Drug Allergies  morphine   History & Physical: Heent: cataracts NECK: supple without bruits LUNGS: lungs clear to auscultation CV: regular rate and rhythm Abdomen: soft and non-tender  Impression & Plan: Assessment: 1.  COMBINED FORMS AGE RELATED CATARACT; Both Eyes (H25.813) 2.  Diabetes Type 2 No retinopathy (E11.9) 3.   DERMATOCHALASIS, no surgery; Right Upper Lid, Left Upper Lid (H02.831, H02.834) 4.  BLEPHARITIS; Right Upper Lid, Right Lower Lid, Left Upper Lid, Left Lower Lid (H01.001, H01.002,H01.004,H01.005) 5.  OAG BORDERLINE FINDINGS LOW RISK; Both Eyes (H40.013)  Plan: 1.  Cataract accounts for the patient's decreased vision. This visual impairment is not correctable with a tolerable change in glasses or contact lenses. Cataract surgery with an implantation of a new lens should significantly improve the visual and functional status of the patient. Discussed all risks, benefits, alternatives, and potential complications. Discussed the procedures and recovery. Patient desires to have surgery. A-scan ordered and performed today for intra-ocular lens calculations. The surgery will be performed in order to improve vision for driving, reading, and for eye examinations. Recommend phacoemulsification with intra-ocular lens. Recommend Dextenza for post-operative pain and inflammation. Left Eye worse - first. Dilates poorly - shugarcaine by protocol. Malyugin Ring. Omidira.  2.  Stressed importance of blood sugar and blood pressure control, and also yearly eye examinations. Discussed the need for ongoing proactive ocular exams and treatment, hopefully before visual symptoms develop.  3.  Asymptomatic, recommend observation for now. Findings, prognosis and treatment options reviewed.  4.  Blepharitis is present - recommend regular lid cleaning.  5.  Based on cup-to-disc ratio. Positive family history. OCT rNFL shows: WNL OU. IOPs high normal today. Detailed discussion about glaucoma today including importance of maintaining good follow up and following treatment plan, and the possibility of irreversible blindness as part of this disease process.

## 2023-07-12 ENCOUNTER — Ambulatory Visit (INDEPENDENT_AMBULATORY_CARE_PROVIDER_SITE_OTHER): Payer: Medicare HMO | Admitting: Family Medicine

## 2023-07-12 ENCOUNTER — Encounter: Payer: Self-pay | Admitting: Family Medicine

## 2023-07-12 ENCOUNTER — Encounter (HOSPITAL_COMMUNITY)
Admission: RE | Admit: 2023-07-12 | Discharge: 2023-07-12 | Disposition: A | Discharge status: Home / Self Care | Payer: Medicare HMO | Source: Ambulatory Visit | Attending: Ophthalmology | Admitting: Ophthalmology

## 2023-07-12 ENCOUNTER — Encounter (HOSPITAL_COMMUNITY): Payer: Self-pay

## 2023-07-12 VITALS — BP 141/78 | HR 69 | Temp 98.8°F | Ht 67.0 in | Wt 205.0 lb

## 2023-07-12 DIAGNOSIS — I152 Hypertension secondary to endocrine disorders: Secondary | ICD-10-CM

## 2023-07-12 DIAGNOSIS — M810 Age-related osteoporosis without current pathological fracture: Secondary | ICD-10-CM | POA: Diagnosis not present

## 2023-07-12 DIAGNOSIS — Z7985 Long-term (current) use of injectable non-insulin antidiabetic drugs: Secondary | ICD-10-CM

## 2023-07-12 DIAGNOSIS — E785 Hyperlipidemia, unspecified: Secondary | ICD-10-CM

## 2023-07-12 DIAGNOSIS — E1169 Type 2 diabetes mellitus with other specified complication: Secondary | ICD-10-CM

## 2023-07-12 DIAGNOSIS — E1159 Type 2 diabetes mellitus with other circulatory complications: Secondary | ICD-10-CM

## 2023-07-12 HISTORY — DX: Atherosclerotic heart disease of native coronary artery without angina pectoris: I25.10

## 2023-07-12 LAB — BAYER DCA HB A1C WAIVED: HB A1C (BAYER DCA - WAIVED): 7.8 % — ABNORMAL HIGH (ref 4.8–5.6)

## 2023-07-12 MED ORDER — SEMAGLUTIDE (1 MG/DOSE) 4 MG/3ML ~~LOC~~ SOPN: 1.0000 mg | PEN_INJECTOR | SUBCUTANEOUS | Status: DC

## 2023-07-12 NOTE — Patient Instructions (Signed)
Starting Friday after the cataract surgery, you can double up on the Ozempic 0.5mg  shot to equal 1mg . Erica Evans has already ordered the higher dose for you and will call you when it comes in. Ok to go back down on the Metformin Friday to just the suppertime dose.

## 2023-07-12 NOTE — Progress Notes (Signed)
Subjective: CC:DM PCP: Erica Ip, DO WGN:FAOZH [name] Erica Evans is a 72 y.o. female presenting to clinic today for:  1. Type 2 Diabetes with hypertension, hyperlipidemia:  Patient reports compliance with her medications.  She reports blood sugars have been running as high as 160s and as low as 130s.  She is currently injecting 0.5 mg of Ozempic weekly, taking glipizide 10 mg daily and recently increased her metformin extended release to 1000 mg twice daily because she was having blood sugars that were higher than normal.  She has met up with Erica Evans and is under patient assistance program for these medications.  There was talk about increasing her dose to 1 mg and she is here today to get A1c checked and determine if that is something that is needed.  She does note that she has been holding her GLP whilst preparing for cataract surgery on Friday but plans to resume it after she has completed it.  Diabetes Health Maintenance Due  Topic Date Due   HEMOGLOBIN A1C  09/14/2023   FOOT EXAM  12/07/2023   OPHTHALMOLOGY EXAM  06/19/2024    Last A1c:  Lab Results  Component Value Date   HGBA1C 7.2 (H) 03/15/2023    ROS: No chest pain, dizziness, shortness of breath, nausea, vomiting or abdominal pain reported to me.  ROS: Per HPI  Allergies  Allergen Reactions   Jardiance [Empagliflozin]     Yeast infections   Lipitor [Atorvastatin]     Myopathy/fibromyalgia    Morphine And Codeine    Pioglitazone     Ankle swelling   Farxiga [Dapagliflozin]     UTI/CONSTIPATION   Past Medical History:  Diagnosis Date   Arthritis    Coronary artery disease    COVID-19 2022   Diabetes mellitus without complication (HCC)    GERD (gastroesophageal reflux disease)    Hyperlipidemia    Hypertension    Palpitations    Vertigo     Current Outpatient Medications:    amLODipine (NORVASC) 10 MG tablet, TAKE 1 TABLET BY MOUTH EVERY DAY, Disp: 90 tablet, Rfl: 0   ASPIRIN EC PO, Take 325 mg by  mouth at bedtime. Swallow whole., Disp: , Rfl:    blood glucose meter kit and supplies, Check BS once daily, Disp: 1 each, Rfl: 0   busPIRone (BUSPAR) 15 MG tablet, Take 1 tablet (15 mg total) by mouth 3 (three) times daily. (Patient taking differently: Take 7.5 mg by mouth 3 (three) times daily as needed (anxiety).), Disp: 270 tablet, Rfl: 3   Cholecalciferol (D-3-5) 125 MCG (5000 UT) capsule, Take 5,000 Units by mouth in the morning., Disp: , Rfl:    clopidogrel (PLAVIX) 75 MG tablet, Take 1 tablet (75 mg total) by mouth daily., Disp: 30 tablet, Rfl: 6   cyclobenzaprine (FLEXERIL) 5 MG tablet, TAKE 1 TABLET BY MOUTH EVERYDAY AT BEDTIME, Disp: 30 tablet, Rfl: 3   glipiZIDE (GLUCOTROL XL) 10 MG 24 hr tablet, TAKE 1 TABLET (10 MG TOTAL) BY MOUTH DAILY WITH BREAKFAST., Disp: 90 tablet, Rfl: 0   glucose blood (ONETOUCH VERIO) test strip, TEST BLOOD SUGAR ONCE DAILY DX E11.9, Disp: 100 strip, Rfl: 3   hydrochlorothiazide (HYDRODIURIL) 25 MG tablet, TAKE 1 TABLET (25 MG TOTAL) BY MOUTH DAILY., Disp: 90 tablet, Rfl: 0   lidocaine (LIDODERM) 5 %, PLACE 1 PATCH ONTO THE SKIN DAILY. REMOVE & DISCARD PATCH WITHIN 12 HOURS OR AS DIRECTED BY MD, Disp: 30 patch, Rfl: PRN   metFORMIN (GLUCOPHAGE-XR) 500  MG 24 hr tablet, Take 2 tablets (1,000 mg total) by mouth daily with supper., Disp: 180 tablet, Rfl: 3   oxyCODONE-acetaminophen (PERCOCET) 5-325 MG tablet, Take 1 tablet by mouth every 6 (six) hours as needed for severe pain., Disp: 8 tablet, Rfl: 0   pravastatin (PRAVACHOL) 40 MG tablet, Take 1 tablet (40 mg total) by mouth daily. (Patient taking differently: Take 40 mg by mouth at bedtime.), Disp: 90 tablet, Rfl: 3   Semaglutide,0.25 or 0.5MG /DOS, (OZEMPIC, 0.25 OR 0.5 MG/DOSE,) 2 MG/3ML SOPN, Inject 0.5 mg into the skin every 7 (seven) days. PAP, Disp: 9 mL, Rfl: 3 Social History   Socioeconomic History   Marital status: Married    Spouse name: Erica Evans   Number of children: 2   Years of education: Not on file    Highest education level: Associate degree: academic program  Occupational History   Occupation: Geologist, engineering    Comment: retired  Tobacco Use   Smoking status: Former    Current packs/day: 0.00    Types: Cigarettes    Quit date: 1990    Years since quitting: 34.6   Smokeless tobacco: Never  Vaping Use   Vaping status: Never Used  Substance and Sexual Activity   Alcohol use: No   Drug use: No   Sexual activity: Not on file  Other Topics Concern   Not on file  Social History Narrative   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 She has 3 children. She enjoys bird watching and walking.    Social Determinants of Health   Financial Resource Strain: Low Risk  (02/22/2023)   Overall Financial Resource Strain (CARDIA)    Difficulty of Paying Living Expenses: Not hard at all  Food Insecurity: No Food Insecurity (02/22/2023)   Hunger Vital Sign    Worried About Running Out of Food in the Last Year: Never true    Ran Out of Food in the Last Year: Never true  Transportation Needs: No Transportation Needs (02/22/2023)   PRAPARE - Administrator, Civil Service (Medical): No    Lack of Transportation (Non-Medical): No  Physical Activity: Insufficiently Active (02/22/2023)   Exercise Vital Sign    Days of Exercise per Week: 3 days    Minutes of Exercise per Session: 30 min  Stress: No Stress Concern Present (02/22/2023)   [employer] of Occupational Health - Occupational Stress Questionnaire    Feeling of Stress : Not at all  Social Connections: Moderately Integrated (02/22/2023)   Social Connection and Isolation Panel [NHANES]    Frequency of Communication with Friends and Family: More than three times a week    Frequency of Social Gatherings with Friends and Family: More than three times a week    Attends Religious Services: More than 4 times per year    Active Member of [employer] or Organizations: No    Attends Occupational hygienist Meetings: Never    Marital Status: Married  Catering manager Violence: Not At Risk (02/22/2023)   Humiliation, Afraid, Rape, and Kick questionnaire    Fear of Current or Ex-Partner: No    Emotionally Abused: No    Physically Abused: No    Sexually Abused: No   Family History  Problem Relation Age of Onset   Coronary artery disease Mother    Diabetes Mother    Heart disease Mother    Coronary artery disease Father    Heart disease  Father    Diabetes Sister    Hypertension Sister    Heart disease Sister    Diabetes Brother    Diabetes Sister    Arthritis Sister    Hypertension Sister    Diabetes Sister    Hypertension Sister    Hypertension Sister     Objective: Office vital signs reviewed. BP (!) 141/78   Pulse 69   Temp 98.8 F (37.1 C)   Ht 5\' 7"  (1.702 m)   Wt 205 lb (93 kg)   SpO2 98%   BMI 32.11 kg/m   Physical Examination:  General: Awake, alert, well nourished, No acute distress HEENT: sclera white, MMM Cardio: regular rate and rhythm, S1S2 heard, no murmurs appreciated Pulm: clear to auscultation bilaterally, no wheezes, rhonchi or rales; normal work of breathing on room air    Assessment/ Plan: 72 y.o. female   Type 2 diabetes mellitus with other specified complication, without long-term current use of insulin (HCC) - Plan: Microalbumin / creatinine urine ratio, Bayer DCA Hb A1c Waived, Basic Metabolic Panel, Semaglutide, 1 MG/DOSE, 4 MG/3ML SOPN  Hyperlipidemia associated with type 2 diabetes mellitus (HCC)  Hypertension associated with diabetes (HCC) - Plan: Basic Metabolic Panel  Long-term current use of injectable noninsulin antidiabetic medication  Age-related osteoporosis without current pathological fracture - Plan: VITAMIN D 25 Hydroxy (Vit-D Deficiency, Fractures)   A1c is not at goal.  I did discuss her care with our clinical pharmacist and she has already ordered the 1 mg of Ozempic through the patient assistance program.   I instructed the patient to go ahead and resume normal dosing of metformin on Friday and she may go ahead and give herself a double injection of the 0.5 mg to equal this new 1 mg dose while she is waiting on her new mail order.  We will check urine microalbumin, renal function today  She will continue statin and blood pressure medication as prescribed  We will check vitamin D level given history of osteoporosis  I would like to see her back in 3 months, sooner if concerns arise  Cherlynn Popiel Hulen Skains, DO [facility] [phone]

## 2023-07-13 LAB — MICROALBUMIN / CREATININE URINE RATIO
Creatinine, Urine: 116 mg/dL
Microalb/Creat Ratio: 8 mg/g{creat} (ref 0–29)
Microalbumin, Urine: 9.7 ug/mL

## 2023-07-13 LAB — BASIC METABOLIC PANEL
BUN/Creatinine Ratio: 13 (ref 12–28)
BUN: 12 mg/dL (ref 8–27)
CO2: 25 mmol/L (ref 20–29)
Calcium: 9.9 mg/dL (ref 8.7–10.3)
Chloride: 94 mmol/L — ABNORMAL LOW (ref 96–106)
Creatinine, Ser: 0.93 mg/dL (ref 0.57–1.00)
Glucose: 155 mg/dL — ABNORMAL HIGH (ref 70–99)
Potassium: 5.2 mmol/L (ref 3.5–5.2)
Sodium: 136 mmol/L (ref 134–144)
eGFR: 66 mL/min/{1.73_m2} (ref >59)

## 2023-07-13 LAB — VITAMIN D 25 HYDROXY (VIT D DEFICIENCY, FRACTURES): Vit D, 25-Hydroxy: 65.9 ng/mL (ref 30.0–100.0)

## 2023-07-15 ENCOUNTER — Encounter (HOSPITAL_COMMUNITY)
Admission: RE | Disposition: A | Discharge status: Home / Self Care | Payer: Self-pay | Source: Home / Self Care | Attending: Ophthalmology

## 2023-07-15 ENCOUNTER — Ambulatory Visit (HOSPITAL_BASED_OUTPATIENT_CLINIC_OR_DEPARTMENT_OTHER): Payer: Medicare HMO | Admitting: Anesthesiology

## 2023-07-15 ENCOUNTER — Ambulatory Visit (HOSPITAL_COMMUNITY)
Admission: RE | Admit: 2023-07-15 | Discharge: 2023-07-15 | Disposition: A | Discharge status: Home / Self Care | Payer: Medicare HMO | Attending: Ophthalmology | Admitting: Ophthalmology

## 2023-07-15 ENCOUNTER — Ambulatory Visit (HOSPITAL_COMMUNITY): Payer: Medicare HMO | Admitting: Anesthesiology

## 2023-07-15 DIAGNOSIS — K219 Gastro-esophageal reflux disease without esophagitis: Secondary | ICD-10-CM | POA: Diagnosis not present

## 2023-07-15 DIAGNOSIS — E119 Type 2 diabetes mellitus without complications: Secondary | ICD-10-CM | POA: Diagnosis not present

## 2023-07-15 DIAGNOSIS — I2510 Atherosclerotic heart disease of native coronary artery without angina pectoris: Secondary | ICD-10-CM | POA: Diagnosis not present

## 2023-07-15 DIAGNOSIS — Z87891 Personal history of nicotine dependence: Secondary | ICD-10-CM

## 2023-07-15 DIAGNOSIS — E1136 Type 2 diabetes mellitus with diabetic cataract: Secondary | ICD-10-CM | POA: Insufficient documentation

## 2023-07-15 DIAGNOSIS — H25812 Combined forms of age-related cataract, left eye: Secondary | ICD-10-CM | POA: Insufficient documentation

## 2023-07-15 DIAGNOSIS — H01009 Unspecified blepharitis unspecified eye, unspecified eyelid: Secondary | ICD-10-CM | POA: Insufficient documentation

## 2023-07-15 DIAGNOSIS — I10 Essential (primary) hypertension: Secondary | ICD-10-CM | POA: Diagnosis not present

## 2023-07-15 HISTORY — PX: CATARACT EXTRACTION W/PHACO: SHX586

## 2023-07-15 LAB — GLUCOSE, CAPILLARY: Glucose-Capillary: 147 mg/dL — ABNORMAL HIGH (ref 70–99)

## 2023-07-15 SURGERY — PHACOEMULSIFICATION, CATARACT, WITH IOL INSERTION
Anesthesia: Monitor Anesthesia Care | Site: Eye | Laterality: Left

## 2023-07-15 MED ORDER — SODIUM HYALURONATE 23MG/ML IO SOSY
PREFILLED_SYRINGE | INTRAOCULAR | Status: DC | PRN
  Administered 2023-07-15: .6 mL via INTRAOCULAR

## 2023-07-15 MED ORDER — BSS IO SOLN
INTRAOCULAR | Status: DC | PRN
  Administered 2023-07-15: 15 mL via INTRAOCULAR

## 2023-07-15 MED ORDER — MIDAZOLAM HCL 2 MG/2ML IJ SOLN
INTRAMUSCULAR | Status: AC
  Filled 2023-07-15: qty 2

## 2023-07-15 MED ORDER — SODIUM CHLORIDE 0.9% FLUSH
INTRAVENOUS | Status: DC | PRN
  Administered 2023-07-15: 3 mL via INTRAVENOUS

## 2023-07-15 MED ORDER — PHENYLEPHRINE HCL 2.5 % OP SOLN
1.0000 [drp] | OPHTHALMIC | Status: AC | PRN
  Administered 2023-07-15 (×3): 1 [drp] via OPHTHALMIC

## 2023-07-15 MED ORDER — MOXIFLOXACIN HCL 5 MG/ML IO SOLN
INTRAOCULAR | Status: DC | PRN
  Administered 2023-07-15: .3 mL via OPHTHALMIC

## 2023-07-15 MED ORDER — TROPICAMIDE 1 % OP SOLN
1.0000 [drp] | OPHTHALMIC | Status: AC | PRN
  Administered 2023-07-15 (×3): 1 [drp] via OPHTHALMIC

## 2023-07-15 MED ORDER — LIDOCAINE HCL 3.5 % OP GEL
1.0000 | Freq: Once | OPHTHALMIC | Status: AC
  Administered 2023-07-15: 1 via OPHTHALMIC

## 2023-07-15 MED ORDER — POVIDONE-IODINE 5 % OP SOLN
OPHTHALMIC | Status: DC | PRN
  Administered 2023-07-15: 1 via OPHTHALMIC

## 2023-07-15 MED ORDER — SODIUM HYALURONATE 10 MG/ML IO SOLUTION
PREFILLED_SYRINGE | INTRAOCULAR | Status: DC | PRN
  Administered 2023-07-15: .85 mL via INTRAOCULAR

## 2023-07-15 MED ORDER — EPINEPHRINE PF 1 MG/ML IJ SOLN
INTRAOCULAR | Status: DC | PRN
  Administered 2023-07-15: 500 mL

## 2023-07-15 MED ORDER — STERILE WATER FOR IRRIGATION IR SOLN
Status: DC | PRN
  Administered 2023-07-15: 1

## 2023-07-15 MED ORDER — TETRACAINE HCL 0.5 % OP SOLN
1.0000 [drp] | OPHTHALMIC | Status: AC | PRN
  Administered 2023-07-15 (×3): 1 [drp] via OPHTHALMIC

## 2023-07-15 MED ORDER — LIDOCAINE HCL (PF) 1 % IJ SOLN
INTRAOCULAR | Status: DC | PRN
  Administered 2023-07-15: 1 mL via OPHTHALMIC

## 2023-07-15 MED ORDER — MIDAZOLAM HCL 2 MG/2ML IJ SOLN
INTRAMUSCULAR | Status: DC | PRN
  Administered 2023-07-15: 1 mg via INTRAVENOUS

## 2023-07-15 SURGICAL SUPPLY — 13 items
CATARACT SUITE SIGHTPATH (MISCELLANEOUS) ×1
CLOTH BEACON ORANGE TIMEOUT ST (SAFETY) ×1 IMPLANT
EYE SHIELD UNIVERSAL CLEAR (GAUZE/BANDAGES/DRESSINGS) IMPLANT
FEE CATARACT SUITE SIGHTPATH (MISCELLANEOUS) ×1 IMPLANT
GLOVE BIOGEL PI IND STRL 7.0 (GLOVE) ×2 IMPLANT
LENS IOL TECNIS EYHANCE 21.5 (Intraocular Lens) IMPLANT
NDL HYPO 18GX1.5 BLUNT FILL (NEEDLE) ×1 IMPLANT
NEEDLE HYPO 18GX1.5 BLUNT FILL (NEEDLE) ×1
PAD ARMBOARD 7.5X6 YLW CONV (MISCELLANEOUS) ×1 IMPLANT
POSITIONER HEAD 8X9X4 ADT (SOFTGOODS) ×1 IMPLANT
SYR TB 1ML LL NO SAFETY (SYRINGE) ×1 IMPLANT
TAPE SURG TRANSPORE 1 IN (GAUZE/BANDAGES/DRESSINGS) IMPLANT
WATER STERILE IRR 250ML POUR (IV SOLUTION) ×1 IMPLANT

## 2023-07-15 NOTE — Discharge Instructions (Addendum)
Please discharge patient when stable, will follow up today with Dr. Wrzosek at the [facility] office immediately following discharge.  Leave shield in place until visit.  All paperwork with discharge instructions will be given at the office.  [facility] Address:  [address]  [place], [place] [address]  

## 2023-07-15 NOTE — Op Note (Signed)
Date of procedure: 07/15/23  Pre-operative diagnosis: Visually significant age-related combined cataract, Left Eye (H25.812)  Post-operative diagnosis: Visually significant age-related combined cataract, Left Eye (H25.812)  Procedure: Removal of cataract via phacoemulsification and insertion of intra-ocular lens Laural Benes and Johnson DIB00 +21.5D into the capsular bag of the Left Eye  Attending surgeon: Rudy Jew. Ryian Lynde, MD, MA  Anesthesia: MAC, Topical Akten  Complications: None  Estimated Blood Loss: <28mL (minimal)  Specimens: None  Implants: As above  Indications:  Visually significant age-related cataract, Left Eye  Procedure:  The patient was seen and identified in the pre-operative area. The operative eye was identified and dilated.  The operative eye was marked.  Topical anesthesia was administered to the operative eye.     The patient was then to the operative suite and placed in the supine position.  A timeout was performed confirming the patient, procedure to be performed, and all other relevant information.   The patient's face was prepped and draped in the usual fashion for intra-ocular surgery.  A lid speculum was placed into the operative eye and the surgical microscope moved into place and focused.  An inferotemporal paracentesis was created using a 20 gauge paracentesis blade.  Shugarcaine was injected into the anterior chamber.  Viscoelastic was injected into the anterior chamber.  A temporal clear-corneal main wound incision was created using a 2.34mm microkeratome.  A continuous curvilinear capsulorrhexis was initiated using an irrigating cystitome and completed using capsulorrhexis forceps.  Hydrodissection and hydrodeliniation were performed.  Viscoelastic was injected into the anterior chamber.  A phacoemulsification handpiece and a chopper as a second instrument were used to remove the nucleus and epinucleus. The irrigation/aspiration handpiece was used to remove any  remaining cortical material.   The capsular bag was reinflated with viscoelastic, checked, and found to be intact.  The intraocular lens was inserted into the capsular bag.  The irrigation/aspiration handpiece was used to remove any remaining viscoelastic.  The clear corneal wound and paracentesis wounds were then hydrated and checked with Weck-Cels to be watertight. 0.33mL of Moxfloxacin was injected into the anterior chamber. The lid-speculum was removed.  The drape was removed.  The patient's face was cleaned with a wet and dry 4x4.    A clear shield was taped over the eye. The patient was taken to the post-operative care unit in good condition, having tolerated the procedure well.  Post-Op Instructions: The patient will follow up at [facility] for a same day post-operative evaluation and will receive all other orders and instructions.

## 2023-07-15 NOTE — Anesthesia Postprocedure Evaluation (Signed)
Anesthesia Post Note  Patient: Erica Evans  Procedure(s) Performed: CATARACT EXTRACTION PHACO AND INTRAOCULAR LENS PLACEMENT (IOC) (Left: Eye)  Patient location during evaluation: Phase II Anesthesia Type: MAC Level of consciousness: awake Pain management: pain level controlled Vital Signs Assessment: post-procedure vital signs reviewed and stable Respiratory status: spontaneous breathing and respiratory function stable Cardiovascular status: blood pressure returned to baseline and stable Postop Assessment: no headache and no apparent nausea or vomiting Anesthetic complications: no Comments: Late entry   No notable events documented.   Last Vitals:  Vitals:   07/15/23 0830 07/15/23 1007  BP: (!) 140/77 119/61  Pulse: 80 80  Resp: 18 (!) 22  Temp: (!) 36.3 C 36.6 C  SpO2:  100%    Last Pain:  Vitals:   07/15/23 1007  TempSrc: Oral  PainSc: 0-No pain                 Windell Norfolk

## 2023-07-15 NOTE — Anesthesia Preprocedure Evaluation (Signed)
Anesthesia Evaluation  Patient identified by MRN, date of birth, ID band Patient awake    Reviewed: Allergy & Precautions, H&P , NPO status , Patient's Chart, lab work & pertinent test results, reviewed documented beta blocker date and time   Airway Mallampati: II  TM Distance: >3 FB Neck ROM: full    Dental no notable dental hx.    Pulmonary neg pulmonary ROS, former smoker   Pulmonary exam normal breath sounds clear to auscultation       Cardiovascular Exercise Tolerance: Good hypertension, + CAD  negative cardio ROS  Rhythm:regular Rate:Normal     Neuro/Psych  PSYCHIATRIC DISORDERS Anxiety     negative neurological ROS  negative psych ROS   GI/Hepatic negative GI ROS, Neg liver ROS,GERD  ,,  Endo/Other  negative endocrine ROSdiabetes    Renal/GU negative Renal ROS  negative genitourinary   Musculoskeletal   Abdominal   Peds  Hematology negative hematology ROS (+)   Anesthesia Other Findings   Reproductive/Obstetrics negative OB ROS                             Anesthesia Physical Anesthesia Plan  ASA: 3  Anesthesia Plan: MAC   Post-op Pain Management:    Induction:   PONV Risk Score and Plan:   Airway Management Planned:   Additional Equipment:   Intra-op Plan:   Post-operative Plan:   Informed Consent: I have reviewed the patients History and Physical, chart, labs and discussed the procedure including the risks, benefits and alternatives for the proposed anesthesia with the patient or authorized representative who has indicated his/her understanding and acceptance.     Dental Advisory Given  Plan Discussed with: CRNA  Anesthesia Plan Comments:        Anesthesia Quick Evaluation

## 2023-07-15 NOTE — Interval H&P Note (Signed)
History and Physical Interval Note:  07/15/2023 9:39 AM  Erica Evans  has presented today for surgery, with the diagnosis of combined forms age related cataract, left eye.  The various methods of treatment have been discussed with the patient and family. After consideration of risks, benefits and other options for treatment, the patient has consented to  Procedure(s): CATARACT EXTRACTION PHACO AND INTRAOCULAR LENS PLACEMENT (IOC) (Left) as a surgical intervention.  The patient's history has been reviewed, patient examined, no change in status, stable for surgery.  I have reviewed the patient's chart and labs.  Questions were answered to the patient's satisfaction.     Fabio Pierce

## 2023-07-15 NOTE — Transfer of Care (Signed)
Immediate Anesthesia Transfer of Care Note  Patient: Erica Evans  Procedure(s) Performed: CATARACT EXTRACTION PHACO AND INTRAOCULAR LENS PLACEMENT (IOC) (Left: Eye)  Patient Location: Short Stay  Anesthesia Type:MAC  Level of Consciousness: awake, alert , oriented, and patient cooperative  Airway & Oxygen Therapy: Patient Spontanous Breathing  Post-op Assessment: Report given to RN, Post -op Vital signs reviewed and stable, and Patient moving all extremities X 4  Post vital signs: Reviewed and stable  Last Vitals:  Vitals Value Taken Time  BP    Temp    Pulse    Resp    SpO2      Last Pain:  Vitals:   07/15/23 0830  TempSrc: Oral  PainSc: 0-No pain      Patients Stated Pain Goal: 5 (07/15/23 0830)  Complications: No notable events documented.

## 2023-07-19 ENCOUNTER — Encounter (HOSPITAL_COMMUNITY): Payer: Self-pay | Admitting: Ophthalmology

## 2023-07-22 ENCOUNTER — Encounter: Payer: Self-pay | Admitting: Pharmacist

## 2023-07-26 NOTE — Telephone Encounter (Signed)
Faxed ozempic 1mg  dose increase to [facility].

## 2023-08-01 NOTE — Telephone Encounter (Signed)
Dose increase approved 07/27/23  Should arrive to office in 10-14 business days

## 2023-08-09 ENCOUNTER — Encounter: Payer: Self-pay | Admitting: Pharmacist

## 2023-08-29 ENCOUNTER — Other Ambulatory Visit: Payer: Self-pay | Admitting: Family Medicine

## 2023-08-29 DIAGNOSIS — E1159 Type 2 diabetes mellitus with other circulatory complications: Secondary | ICD-10-CM

## 2023-09-02 ENCOUNTER — Other Ambulatory Visit: Payer: Self-pay | Admitting: Family Medicine

## 2023-09-02 DIAGNOSIS — I152 Hypertension secondary to endocrine disorders: Secondary | ICD-10-CM

## 2023-09-09 ENCOUNTER — Other Ambulatory Visit: Payer: Self-pay | Admitting: Family Medicine

## 2023-09-11 ENCOUNTER — Other Ambulatory Visit: Payer: Self-pay | Admitting: Family Medicine

## 2023-09-11 DIAGNOSIS — E1165 Type 2 diabetes mellitus with hyperglycemia: Secondary | ICD-10-CM

## 2023-09-27 NOTE — Progress Notes (Unsigned)
Office Note     HPI: Erica Evans is a 72 y.o. ([birth year]) female presenting for follow-up right carotid endarterectomy for severe asymptomatic disease with Dr. Karin Lieu on 12/16/2022.  On exam, Erica Evans was doing well.  She notes some continued numbness at the incision site, but is otherwise asymptomatic.  She continues to try to live an active lifestyle.  Spends the majority of her day taking care of her husband who battles with hydrocephalus.  He is not a candidate for a shunt, when he gets sick he has significant gait difficulties.  Per Tyler Aas, physical therapy has helped him significantly, and he is now ambulating with a cane.  She denies history of TIA, stroke, amaurosis.  Denies any asymmetry in the face. Denies symptoms of claudication, ischemic rest pain, tissue loss.  The pt is  on a statin for cholesterol management.  The pt is  on a daily aspirin.   Other AC:  -   Past Medical History:  Diagnosis Date   Arthritis    Coronary artery disease    COVID-19 2022   Diabetes mellitus without complication (HCC)    GERD (gastroesophageal reflux disease)    Hyperlipidemia    Hypertension    Palpitations    Vertigo     Past Surgical History:  Procedure Laterality Date   CATARACT EXTRACTION W/PHACO Left 07/15/2023   Procedure: CATARACT EXTRACTION PHACO AND INTRAOCULAR LENS PLACEMENT (IOC);  Surgeon: Fabio Pierce, MD;  Location: [facility];  Service: Ophthalmology;  Laterality: Left;  CDE 18.83   ENDARTERECTOMY Right 12/16/2022   Procedure: RIGHT CAROTID ENDARTERECTOMY;  Surgeon: Victorino Sparrow, MD;  Location: [facility] OR;  Service: Vascular;  Laterality: Right;    Social History   Socioeconomic History   Marital status: Married    Spouse name: [name]   Number of children: 2   Years of education: Not on file   Highest education level: Associate degree: academic program  Occupational History   Occupation: Geologist, engineering    Comment: retired  Tobacco Use   Smoking status:  Former    Current packs/day: 0.00    Types: Cigarettes    Quit date: 1990    Years since quitting: 34.8   Smokeless tobacco: Never  Vaping Use   Vaping status: Never Used  Substance and Sexual Activity   Alcohol use: No   Drug use: No   Sexual activity: Not on file  Other Topics Concern   Not on file  Social History Narrative   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 She lives at home with her husband [name] and her grown son. She has 3 children. She enjoys bird watching and walking.    Social Determinants of Health   Financial Resource Strain: Low Risk  (02/22/2023)   Overall Financial Resource Strain (CARDIA)    Difficulty of Paying Living Expenses: Not hard at all  Food Insecurity: No Food Insecurity (02/22/2023)   Hunger Vital Sign    Worried About Running Out of Food in the Last Year: Never true    Ran Out of Food in the Last Year: Never true  Transportation Needs: No Transportation Needs (02/22/2023)   PRAPARE - Administrator, Civil Service (Medical): No    Lack of Transportation (Non-Medical): No  Physical Activity: Insufficiently Active (02/22/2023)   Exercise Vital Sign    Days of Exercise per Week: 3 days    Minutes of Exercise per Session: 30 min  Stress: No Stress Concern Present (02/22/2023)  [employer] of Occupational Health - Occupational Stress Questionnaire    Feeling of Stress : Not at all  Social Connections: Moderately Integrated (02/22/2023)   Social Connection and Isolation Panel [NHANES]    Frequency of Communication with Friends and Family: More than three times a week    Frequency of Social Gatherings with Friends and Family: More than three times a week    Attends Religious Services: More than 4 times per year    Active Member of [employer] or Organizations: No    Attends Banker Meetings: Never    Marital Status: Married  Catering manager Violence: Not At Risk (02/22/2023)   Humiliation, Afraid, Rape, and Kick questionnaire    Fear  of Current or Ex-Partner: No    Emotionally Abused: No    Physically Abused: No    Sexually Abused: No   Family History  Problem Relation Age of Onset   Coronary artery disease Mother    Diabetes Mother    Heart disease Mother    Coronary artery disease Father    Heart disease Father    Diabetes Sister    Hypertension Sister    Heart disease Sister    Diabetes Brother    Diabetes Sister    Arthritis Sister    Hypertension Sister    Diabetes Sister    Hypertension Sister    Hypertension Sister     Current Outpatient Medications  Medication Sig Dispense Refill   amLODipine (NORVASC) 10 MG tablet TAKE 1 TABLET BY MOUTH EVERY DAY 90 tablet 0   ASPIRIN EC PO Take 325 mg by mouth at bedtime. Swallow whole.     blood glucose meter kit and supplies Check BS once daily 1 each 0   busPIRone (BUSPAR) 15 MG tablet Take 1 tablet (15 mg total) by mouth 3 (three) times daily. (Patient taking differently: Take 7.5 mg by mouth 3 (three) times daily as needed (anxiety).) 270 tablet 3   Cholecalciferol (D-3-5) 125 MCG (5000 UT) capsule Take 5,000 Units by mouth in the morning.     clopidogrel (PLAVIX) 75 MG tablet Take 1 tablet (75 mg total) by mouth daily. 30 tablet 6   cyclobenzaprine (FLEXERIL) 5 MG tablet Take 1 tablet (5 mg total) by mouth at bedtime as needed for muscle spasms. 90 tablet 3   glipiZIDE (GLUCOTROL XL) 10 MG 24 hr tablet TAKE 1 TABLET (10 MG TOTAL) BY MOUTH DAILY WITH BREAKFAST. 90 tablet 0   glucose blood (ONETOUCH VERIO) test strip TEST BLOOD SUGAR ONCE DAILY DX E11.9 100 strip 3   hydrochlorothiazide (HYDRODIURIL) 25 MG tablet TAKE 1 TABLET (25 MG TOTAL) BY MOUTH DAILY. 90 tablet 0   lidocaine (LIDODERM) 5 % PLACE 1 PATCH ONTO THE SKIN DAILY. REMOVE & DISCARD PATCH WITHIN 12 HOURS OR AS DIRECTED BY MD 30 patch PRN   metFORMIN (GLUCOPHAGE-XR) 500 MG 24 hr tablet Take 2 tablets (1,000 mg total) by mouth daily with supper. 180 tablet 3   oxyCODONE-acetaminophen (PERCOCET) 5-325  MG tablet Take 1 tablet by mouth every 6 (six) hours as needed for severe pain. 8 tablet 0   pravastatin (PRAVACHOL) 40 MG tablet Take 1 tablet (40 mg total) by mouth daily. (Patient taking differently: Take 40 mg by mouth at bedtime.) 90 tablet 3   Semaglutide, 1 MG/DOSE, 4 MG/3ML SOPN Inject 1 mg as directed once a week. PAP program     No current facility-administered medications for this visit.    Allergies  Allergen Reactions  Jardiance [Empagliflozin]     Yeast infections   Lipitor [Atorvastatin]     Myopathy/fibromyalgia    Morphine And Codeine    Pioglitazone     Ankle swelling   Farxiga [Dapagliflozin]     UTI/CONSTIPATION     REVIEW OF SYSTEMS:  [X]  denotes positive finding, [ ]  denotes negative finding Cardiac  Comments:  Chest pain or chest pressure:    Shortness of breath upon exertion:    Short of breath when lying flat:    Irregular heart rhythm:        Vascular    Pain in calf, thigh, or hip brought on by ambulation:    Pain in feet at night that wakes you up from your sleep:     Blood clot in your veins:    Leg swelling:         Pulmonary    Oxygen at home:    Productive cough:     Wheezing:         Neurologic    Sudden weakness in arms or legs:     Sudden numbness in arms or legs:     Sudden onset of difficulty speaking or slurred speech:    Temporary loss of vision in one eye:     Problems with dizziness:         Gastrointestinal    Blood in stool:     Vomited blood:         Genitourinary    Burning when urinating:     Blood in urine:        Psychiatric    Major depression:         Hematologic    Bleeding problems:    Problems with blood clotting too easily:        Skin    Rashes or ulcers:        Constitutional    Fever or chills:      PHYSICAL EXAMINATION:  Vitals:   09/29/23 0948 09/29/23 0950  BP: 126/76 123/79  Pulse: 84   Resp: 20   Temp: 97.7 F (36.5 C)   SpO2: 98%   Weight: 209 lb (94.8 kg)   Height: 5\' 7"   (1.702 m)     General:  WDWN in NAD; vital signs documented above Gait: Not observed HENT: WNL, normocephalic Pulmonary: normal non-labored breathing , without wheezing Cardiac: regular HR Abdomen: soft, NT, no masses Skin: without rashes Vascular Exam/Pulses:  Right Left  Radial 2+ (normal) 2+ (normal)  Ulnar    Femoral    Popliteal    DP 2+ (normal) 2+ (normal)  PT     Extremities: without ischemic changes, without Gangrene , without cellulitis; without open wounds;  Musculoskeletal: no muscle wasting or atrophy  Neurologic: A&O X 3;  No focal weakness or paresthesias are detected Psychiatric:  The pt has Normal affect.   Non-Invasive Vascular Imaging:   Summary:  Right Carotid: Velocities in the right ICA are consistent with a 1-39%  stenosis.   Left Carotid: Velocities in the left ICA are consistent with a 1-39%  stenosis.   Vertebrals: Bilateral vertebral arteries demonstrate antegrade flow.  Subclavians: Normal flow hemodynamics were seen in bilateral subclavian               arteries.     ASSESSMENT/PLAN: Erica Evans is a 72 y.o. female presenting status post 12/16/2022 right carotid endarterectomy for asymptomatic disease.  On exam, Erica Evans was doing well. Imaging was reviewed,  widely patent bilateral internal carotid arteries.  We discussed the importance of continuing aspirin, high intensity statin. I asked her to call should any questions or concerns arise. She stated she would like to be seen in [place] if possible to limit driving to [place].  I plan to set her up with one of my partners to be seen in 1 years time.    Victorino Sparrow, MD [facility] [phone] Total time of patient care including pre-visit research, consultation, and documentation greater than 20 minutes

## 2023-09-29 ENCOUNTER — Ambulatory Visit (HOSPITAL_COMMUNITY)
Admission: RE | Admit: 2023-09-29 | Discharge: 2023-09-29 | Disposition: A | Discharge status: Home / Self Care | Payer: Medicare HMO | Source: Ambulatory Visit | Attending: Vascular Surgery | Admitting: Vascular Surgery

## 2023-09-29 ENCOUNTER — Ambulatory Visit: Payer: Medicare HMO | Admitting: Vascular Surgery

## 2023-09-29 ENCOUNTER — Encounter: Payer: Self-pay | Admitting: Vascular Surgery

## 2023-09-29 VITALS — BP 123/79 | HR 84 | Temp 97.7°F | Resp 20 | Ht 67.0 in | Wt 209.0 lb

## 2023-09-29 DIAGNOSIS — I6529 Occlusion and stenosis of unspecified carotid artery: Secondary | ICD-10-CM | POA: Diagnosis present

## 2023-09-29 DIAGNOSIS — Z9889 Other specified postprocedural states: Secondary | ICD-10-CM | POA: Diagnosis not present

## 2023-09-29 DIAGNOSIS — I6523 Occlusion and stenosis of bilateral carotid arteries: Secondary | ICD-10-CM

## 2023-10-07 ENCOUNTER — Other Ambulatory Visit: Payer: Self-pay

## 2023-10-07 DIAGNOSIS — I6523 Occlusion and stenosis of bilateral carotid arteries: Secondary | ICD-10-CM

## 2023-10-27 ENCOUNTER — Other Ambulatory Visit (HOSPITAL_COMMUNITY): Payer: Self-pay | Admitting: Family Medicine

## 2023-10-27 DIAGNOSIS — Z1231 Encounter for screening mammogram for malignant neoplasm of breast: Secondary | ICD-10-CM

## 2023-11-02 ENCOUNTER — Encounter (HOSPITAL_COMMUNITY): Payer: Self-pay

## 2023-11-02 ENCOUNTER — Ambulatory Visit (HOSPITAL_COMMUNITY)
Admission: RE | Admit: 2023-11-02 | Discharge: 2023-11-02 | Disposition: A | Discharge status: Home / Self Care | Payer: Medicare HMO | Source: Ambulatory Visit | Attending: Family Medicine | Admitting: Family Medicine

## 2023-11-02 DIAGNOSIS — Z1231 Encounter for screening mammogram for malignant neoplasm of breast: Secondary | ICD-10-CM | POA: Diagnosis present

## 2023-11-04 ENCOUNTER — Telehealth: Payer: Self-pay | Admitting: Pharmacist

## 2023-11-04 ENCOUNTER — Telehealth: Payer: Self-pay

## 2023-11-04 ENCOUNTER — Encounter: Payer: Self-pay | Admitting: Family Medicine

## 2023-11-04 ENCOUNTER — Ambulatory Visit: Payer: Medicare HMO | Admitting: Family Medicine

## 2023-11-04 VITALS — BP 131/73 | HR 77 | Temp 98.2°F | Ht 67.0 in | Wt 209.0 lb

## 2023-11-04 DIAGNOSIS — Z7985 Long-term (current) use of injectable non-insulin antidiabetic drugs: Secondary | ICD-10-CM

## 2023-11-04 DIAGNOSIS — Z23 Encounter for immunization: Secondary | ICD-10-CM | POA: Diagnosis not present

## 2023-11-04 DIAGNOSIS — E1169 Type 2 diabetes mellitus with other specified complication: Secondary | ICD-10-CM | POA: Diagnosis not present

## 2023-11-04 DIAGNOSIS — E1159 Type 2 diabetes mellitus with other circulatory complications: Secondary | ICD-10-CM | POA: Diagnosis not present

## 2023-11-04 DIAGNOSIS — E119 Type 2 diabetes mellitus without complications: Secondary | ICD-10-CM

## 2023-11-04 DIAGNOSIS — E785 Hyperlipidemia, unspecified: Secondary | ICD-10-CM | POA: Diagnosis not present

## 2023-11-04 LAB — BAYER DCA HB A1C WAIVED: HB A1C (BAYER DCA - WAIVED): 7.7 % — ABNORMAL HIGH (ref 4.8–5.6)

## 2023-11-04 NOTE — Progress Notes (Signed)
Subjective: CC:DM PCP: Erica Ip, DO QMV:HQION [name] Erica Evans is a 72 y.o. female presenting to clinic today for:  1. Type 2 Diabetes with hypertension, hyperlipidemia:  She reports compliance with medications.  She continues to double up on Ozempic 0.5 to equal to 1 mg but notes that she has gotten her patient assistance program shipment of the 1 mg.  She was told to increase her metformin to 1500 mg daily because she was having persistent hyperglycemia.  She reports no hypoglycemic episodes.  Average blood sugars have been running around 130s.  Diabetes Health Maintenance Due  Topic Date Due   FOOT EXAM  12/07/2023   HEMOGLOBIN A1C  01/12/2024   OPHTHALMOLOGY EXAM  06/19/2024    Last A1c:  Lab Results  Component Value Date   HGBA1C 7.8 (H) 07/12/2023    ROS: Had a little bit of blurred vision in the left eye after cataract surgery but that has since resolved.  Waiting until after the new year to get the right eye done.  Reports no chest pain, shortness of breath or dizziness  ROS: Per HPI  Allergies  Allergen Reactions   Jardiance [Empagliflozin]     Yeast infections   Lipitor [Atorvastatin]     Myopathy/fibromyalgia    Morphine And Codeine    Pioglitazone     Ankle swelling   Farxiga [Dapagliflozin]     UTI/CONSTIPATION   Past Medical History:  Diagnosis Date   Arthritis    Coronary artery disease    COVID-19 2022   Diabetes mellitus without complication (HCC)    GERD (gastroesophageal reflux disease)    Hyperlipidemia    Hypertension    Palpitations    Vertigo     Current Outpatient Medications:    amLODipine (NORVASC) 10 MG tablet, TAKE 1 TABLET BY MOUTH EVERY DAY, Disp: 90 tablet, Rfl: 0   ASPIRIN EC PO, Take 325 mg by mouth at bedtime. Swallow whole., Disp: , Rfl:    blood glucose meter kit and supplies, Check BS once daily, Disp: 1 each, Rfl: 0   busPIRone (BUSPAR) 15 MG tablet, Take 1 tablet (15 mg total) by mouth 3 (three) times daily.  (Patient taking differently: Take 7.5 mg by mouth 3 (three) times daily as needed (anxiety).), Disp: 270 tablet, Rfl: 3   Cholecalciferol (D-3-5) 125 MCG (5000 UT) capsule, Take 5,000 Units by mouth in the morning., Disp: , Rfl:    clopidogrel (PLAVIX) 75 MG tablet, Take 1 tablet (75 mg total) by mouth daily., Disp: 30 tablet, Rfl: 6   cyclobenzaprine (FLEXERIL) 5 MG tablet, Take 1 tablet (5 mg total) by mouth at bedtime as needed for muscle spasms., Disp: 90 tablet, Rfl: 3   glipiZIDE (GLUCOTROL XL) 10 MG 24 hr tablet, TAKE 1 TABLET (10 MG TOTAL) BY MOUTH DAILY WITH BREAKFAST., Disp: 90 tablet, Rfl: 0   glucose blood (ONETOUCH VERIO) test strip, TEST BLOOD SUGAR ONCE DAILY DX E11.9, Disp: 100 strip, Rfl: 3   hydrochlorothiazide (HYDRODIURIL) 25 MG tablet, TAKE 1 TABLET (25 MG TOTAL) BY MOUTH DAILY., Disp: 90 tablet, Rfl: 0   lidocaine (LIDODERM) 5 %, PLACE 1 PATCH ONTO THE SKIN DAILY. REMOVE & DISCARD PATCH WITHIN 12 HOURS OR AS DIRECTED BY MD, Disp: 30 patch, Rfl: PRN   metFORMIN (GLUCOPHAGE-XR) 500 MG 24 hr tablet, Take 2 tablets (1,000 mg total) by mouth daily with supper., Disp: 180 tablet, Rfl: 3   oxyCODONE-acetaminophen (PERCOCET) 5-325 MG tablet, Take 1 tablet by mouth every  6 (six) hours as needed for severe pain., Disp: 8 tablet, Rfl: 0   pravastatin (PRAVACHOL) 40 MG tablet, Take 1 tablet (40 mg total) by mouth daily. (Patient taking differently: Take 40 mg by mouth at bedtime.), Disp: 90 tablet, Rfl: 3   Semaglutide, 1 MG/DOSE, 4 MG/3ML SOPN, Inject 1 mg as directed once a week. PAP program, Disp: , Rfl:  Social History   Socioeconomic History   Marital status: Married    Spouse name: Erica Evans   Number of children: 2   Years of education: Not on file   Highest education level: Associate degree: academic program  Occupational History   Occupation: Geologist, engineering    Comment: retired  Tobacco Use   Smoking status: Former    Current packs/day: 0.00    Types: Cigarettes    Quit  date: 1990    Years since quitting: 34.9   Smokeless tobacco: Never  Vaping Use   Vaping status: Never Used  Substance and Sexual Activity   Alcohol use: No   Drug use: No   Sexual activity: Not on file  Other Topics Concern   Not on file  Social History Narrative   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 She has 3 children. She enjoys bird watching and walking.    Social Drivers of Corporate investment banker Strain: Low Risk  (02/22/2023)   Overall Financial Resource Strain (CARDIA)    Difficulty of Paying Living Expenses: Not hard at all  Food Insecurity: No Food Insecurity (02/22/2023)   Hunger Vital Sign    Worried About Running Out of Food in the Last Year: Never true    Ran Out of Food in the Last Year: Never true  Transportation Needs: No Transportation Needs (02/22/2023)   PRAPARE - Administrator, Civil Service (Medical): No    Lack of Transportation (Non-Medical): No  Physical Activity: Insufficiently Active (02/22/2023)   Exercise Vital Sign    Days of Exercise per Week: 3 days    Minutes of Exercise per Session: 30 min  Stress: No Stress Concern Present (02/22/2023)   [employer] of Occupational Health - Occupational Stress Questionnaire    Feeling of Stress : Not at all  Social Connections: Moderately Integrated (02/22/2023)   Social Connection and Isolation Panel [NHANES]    Frequency of Communication with Friends and Family: More than three times a week    Frequency of Social Gatherings with Friends and Family: More than three times a week    Attends Religious Services: More than 4 times per year    Active Member of [employer] or Organizations: No    Attends Banker Meetings: Never    Marital Status: Married  Catering manager Violence: Not At Risk (02/22/2023)   Humiliation, Afraid, Rape, and Kick questionnaire    Fear of Current or Ex-Partner: No    Emotionally Abused: No    Physically  Abused: No    Sexually Abused: No   Family History  Problem Relation Age of Onset   Coronary artery disease Mother    Diabetes Mother    Heart disease Mother    Coronary artery disease Father    Heart disease Father    Diabetes Sister    Hypertension Sister    Heart disease Sister    Diabetes Brother    Diabetes Sister    Arthritis Sister    Hypertension Sister  Diabetes Sister    Hypertension Sister    Hypertension Sister     Objective: Office vital signs reviewed. BP 131/73   Pulse 77   Temp 98.2 F (36.8 C)   Ht 5\' 7"  (1.702 m)   Wt 209 lb (94.8 kg)   SpO2 96%   BMI 32.73 kg/m   Physical Examination:  General: Awake, alert, well nourished, No acute distress HEENT: sclera white, MMM. Cardio: regular rate and rhythm, S1S2 heard, no murmurs appreciated Pulm: clear to auscultation bilaterally, no wheezes, rhonchi or rales; normal work of breathing on room air  Lab Results  Component Value Date   HGBA1C 7.7 (H) 11/04/2023    Assessment/ Plan: 72 y.o. female   Diabetes mellitus treated with injections of non-insulin medication (HCC) - Plan: Bayer DCA Hb A1c Waived  Hyperlipidemia associated with type 2 diabetes mellitus (HCC)  Hypertension associated with diabetes (HCC)  Immunization due - Plan: Pneumococcal conjugate vaccine 20-valent (Prevnar 20)  Sugar not at goal with A1c at 7.7.  I spoken to clinical pharmacy and she will work on getting 2 mg of Ozempic going forward.  Discussed doubling up to 2 mg with her current supply.  For now continue metformin at current dose  She will continue all other medications as prescribed.  Blood pressure was controlled and she is not yet due for fasting lipid.  Pneumococcal vaccination administered today.  She had her flu shot done at [facility]   Erica Ip, DO [facility] [phone]

## 2023-11-04 NOTE — Telephone Encounter (Signed)
   Patient enrolled in the [employer] patient assistance program for [employer].  She is tolerating 1mg  weekly.  PCP would like to increase to 2mg  weekly.  Dose change form submitted electronically via [employer] website.  Process can take 4-6 weeks to get new dose shipped to PCP office.  Patient instructed to double on her current 1mg  pen.  Routed copy of PAP to CPhT for documentation.  Kieth Brightly, PharmD, BCACP, CPP Clinical Pharmacist, [facility]

## 2023-11-04 NOTE — Progress Notes (Unsigned)
Pharmacy Medication Assistance Program Note    11/04/2023  Patient ID: Erica Evans, female   DOB: 1951-05-27, 72 y.o.   MRN: [id number]     11/04/2023  Outreach Medication One  Manufacturer Medication One [employer] Drugs Ozempic  Dose of Ozempic 2MG   Type of Sport and exercise psychologist  Date Application Submitted to [employer] 11/04/2023  Method Application Sent to [employer] online by [employer].

## 2023-12-04 ENCOUNTER — Other Ambulatory Visit: Payer: Self-pay | Admitting: Family Medicine

## 2023-12-04 DIAGNOSIS — E1165 Type 2 diabetes mellitus with hyperglycemia: Secondary | ICD-10-CM

## 2023-12-04 DIAGNOSIS — F411 Generalized anxiety disorder: Secondary | ICD-10-CM

## 2023-12-04 DIAGNOSIS — E1159 Type 2 diabetes mellitus with other circulatory complications: Secondary | ICD-10-CM

## 2023-12-04 DIAGNOSIS — E1169 Type 2 diabetes mellitus with other specified complication: Secondary | ICD-10-CM

## 2023-12-05 ENCOUNTER — Other Ambulatory Visit: Payer: Self-pay | Admitting: Family Medicine

## 2023-12-05 DIAGNOSIS — I152 Hypertension secondary to endocrine disorders: Secondary | ICD-10-CM

## 2023-12-17 ENCOUNTER — Other Ambulatory Visit: Payer: Self-pay | Admitting: Family Medicine

## 2023-12-17 DIAGNOSIS — E1165 Type 2 diabetes mellitus with hyperglycemia: Secondary | ICD-10-CM

## 2023-12-19 ENCOUNTER — Telehealth: Payer: Self-pay | Admitting: *Deleted

## 2023-12-19 NOTE — Telephone Encounter (Signed)
Patient aware her medication assistance Ozempic available for pick up

## 2023-12-27 ENCOUNTER — Other Ambulatory Visit: Payer: Self-pay | Admitting: Family Medicine

## 2023-12-27 DIAGNOSIS — E1169 Type 2 diabetes mellitus with other specified complication: Secondary | ICD-10-CM

## 2024-02-03 ENCOUNTER — Ambulatory Visit: Payer: Medicare HMO | Admitting: Family Medicine

## 2024-02-03 ENCOUNTER — Encounter: Payer: Self-pay | Admitting: Family Medicine

## 2024-02-03 VITALS — BP 134/67 | HR 73 | Temp 98.4°F | Ht 67.0 in | Wt 204.0 lb

## 2024-02-03 DIAGNOSIS — Z7985 Long-term (current) use of injectable non-insulin antidiabetic drugs: Secondary | ICD-10-CM

## 2024-02-03 DIAGNOSIS — E1159 Type 2 diabetes mellitus with other circulatory complications: Secondary | ICD-10-CM

## 2024-02-03 DIAGNOSIS — Z23 Encounter for immunization: Secondary | ICD-10-CM

## 2024-02-03 DIAGNOSIS — I152 Hypertension secondary to endocrine disorders: Secondary | ICD-10-CM

## 2024-02-03 DIAGNOSIS — E1169 Type 2 diabetes mellitus with other specified complication: Secondary | ICD-10-CM

## 2024-02-03 DIAGNOSIS — E785 Hyperlipidemia, unspecified: Secondary | ICD-10-CM

## 2024-02-03 LAB — CMP14+EGFR
ALT: 12 IU/L (ref 0–32)
AST: 16 IU/L (ref 0–40)
Albumin: 4.4 g/dL (ref 3.8–4.8)
Alkaline Phosphatase: 78 IU/L (ref 44–121)
BUN/Creatinine Ratio: 12 (ref 12–28)
BUN: 11 mg/dL (ref 8–27)
Bilirubin Total: 0.3 mg/dL (ref 0.0–1.2)
CO2: 22 mmol/L (ref 20–29)
Calcium: 10 mg/dL (ref 8.7–10.3)
Chloride: 92 mmol/L — ABNORMAL LOW (ref 96–106)
Creatinine, Ser: 0.91 mg/dL (ref 0.57–1.00)
Globulin, Total: 2.9 g/dL (ref 1.5–4.5)
Glucose: 147 mg/dL — ABNORMAL HIGH (ref 70–99)
Potassium: 5.2 mmol/L (ref 3.5–5.2)
Sodium: 133 mmol/L — ABNORMAL LOW (ref 134–144)
Total Protein: 7.3 g/dL (ref 6.0–8.5)
eGFR: 67 mL/min/{1.73_m2} (ref >59)

## 2024-02-03 LAB — BAYER DCA HB A1C WAIVED: HB A1C (BAYER DCA - WAIVED): 7 % — ABNORMAL HIGH (ref 4.8–5.6)

## 2024-02-03 LAB — LIPID PANEL
Chol/HDL Ratio: 2.6 ratio (ref 0.0–4.4)
Cholesterol, Total: 138 mg/dL (ref 100–199)
HDL: 53 mg/dL (ref >39)
LDL Chol Calc (NIH): 60 mg/dL (ref 0–99)
Triglycerides: 149 mg/dL (ref 0–149)
VLDL Cholesterol Cal: 25 mg/dL (ref 5–40)

## 2024-02-03 MED ORDER — HYDROCHLOROTHIAZIDE 25 MG PO TABS: 25.0000 mg | ORAL_TABLET | Freq: Every day | ORAL | 4 refills | Status: DC

## 2024-02-03 MED ORDER — METFORMIN HCL ER 500 MG PO TB24: 1000.0000 mg | ORAL_TABLET | Freq: Every day | ORAL | 4 refills | Status: DC

## 2024-02-03 MED ORDER — GLIPIZIDE ER 10 MG PO TB24: 10.0000 mg | ORAL_TABLET | Freq: Every day | ORAL | 4 refills | Status: DC

## 2024-02-03 MED ORDER — SEMAGLUTIDE (2 MG/DOSE) 8 MG/3ML ~~LOC~~ SOPN: 2.0000 mg | PEN_INJECTOR | SUBCUTANEOUS | Status: DC

## 2024-02-03 MED ORDER — AMLODIPINE BESYLATE 10 MG PO TABS: 10.0000 mg | ORAL_TABLET | Freq: Every day | ORAL | 4 refills | Status: DC

## 2024-02-03 MED ORDER — PRAVASTATIN SODIUM 40 MG PO TABS: 40.0000 mg | ORAL_TABLET | Freq: Every day | ORAL | 4 refills | Status: DC

## 2024-02-03 NOTE — Patient Instructions (Signed)
 Sugar looking better!  Keep watching diet

## 2024-02-03 NOTE — Progress Notes (Signed)
 Subjective: CC:DM PCP: Raliegh Ip, DO DGL:OVFIE C Gusler Erica Evans is a 73 y.o. female presenting to clinic today for:  1. Type 2 Diabetes with hypertension, hyperlipidemia:  She reports compliance with Ozempic 2 mg weekly.  No concerning side effects.  Has been on this regimen for about 2 months now.  Compliant with glipizide 10 mg daily, metformin extended release 1000 mg daily, Norvasc 10 mg daily, HCTZ 25 mg daily and Pravachol 40 mg daily.  No hypoglycemic episodes.  Blood sugars are typically in the low 100s  Diabetes Health Maintenance Due  Topic Date Due   FOOT EXAM  12/07/2023   HEMOGLOBIN A1C  05/04/2024   OPHTHALMOLOGY EXAM  06/19/2024    Last A1c:  Lab Results  Component Value Date   HGBA1C 7.7 (H) 11/04/2023    ROS: No chest pain, shortness of breath or falls.  Occasionally has some right ankle edema and sees podiatry for this.   ROS: Per HPI  Allergies  Allergen Reactions   Jardiance [Empagliflozin]     Yeast infections   Lipitor [Atorvastatin]     Myopathy/fibromyalgia    Morphine And Codeine    Pioglitazone     Ankle swelling   Farxiga [Dapagliflozin]     UTI/CONSTIPATION   Past Medical History:  Diagnosis Date   Arthritis    Coronary artery disease    COVID-19 2022   Diabetes mellitus without complication (HCC)    GERD (gastroesophageal reflux disease)    Hyperlipidemia    Hypertension    Palpitations    Vertigo     Current Outpatient Medications:    amLODipine (NORVASC) 10 MG tablet, TAKE 1 TABLET BY MOUTH EVERY DAY, Disp: 90 tablet, Rfl: 0   ASPIRIN EC PO, Take 325 mg by mouth at bedtime. Swallow whole., Disp: , Rfl:    blood glucose meter kit and supplies, Check BS once daily, Disp: 1 each, Rfl: 0   busPIRone (BUSPAR) 15 MG tablet, TAKE 1 TABLET BY MOUTH 3 TIMES DAILY., Disp: 270 tablet, Rfl: 0   Cholecalciferol (D-3-5) 125 MCG (5000 UT) capsule, Take 5,000 Units by mouth in the morning., Disp: , Rfl:    clopidogrel (PLAVIX) 75 MG  tablet, Take 1 tablet (75 mg total) by mouth daily., Disp: 30 tablet, Rfl: 6   cyclobenzaprine (FLEXERIL) 5 MG tablet, Take 1 tablet (5 mg total) by mouth at bedtime as needed for muscle spasms., Disp: 90 tablet, Rfl: 3   glipiZIDE (GLUCOTROL XL) 10 MG 24 hr tablet, TAKE 1 TABLET (10 MG TOTAL) BY MOUTH DAILY WITH BREAKFAST., Disp: 90 tablet, Rfl: 0   glucose blood (ONETOUCH VERIO) test strip, TEST BLOOD SUGAR ONCE DAILY DX E11.9, Disp: 100 strip, Rfl: 3   hydrochlorothiazide (HYDRODIURIL) 25 MG tablet, TAKE 1 TABLET (25 MG TOTAL) BY MOUTH DAILY., Disp: 90 tablet, Rfl: 0   lidocaine (LIDODERM) 5 %, PLACE 1 PATCH ONTO THE SKIN DAILY. REMOVE & DISCARD PATCH WITHIN 12 HOURS OR AS DIRECTED BY MD, Disp: 30 patch, Rfl: PRN   metFORMIN (GLUCOPHAGE-XR) 500 MG 24 hr tablet, TAKE 2 TABLETS (1,000 MG TOTAL) BY MOUTH DAILY WITH SUPPER, Disp: 180 tablet, Rfl: 0   oxyCODONE-acetaminophen (PERCOCET) 5-325 MG tablet, Take 1 tablet by mouth every 6 (six) hours as needed for severe pain., Disp: 8 tablet, Rfl: 0   pravastatin (PRAVACHOL) 40 MG tablet, TAKE 1 TABLET BY MOUTH EVERY DAY, Disp: 90 tablet, Rfl: 0   Semaglutide, 1 MG/DOSE, 4 MG/3ML SOPN, Inject 1 mg as  directed once a week. PAP program (Patient taking differently: Inject 2 mg as directed once a week. Novo nordisk PAP program), Disp: , Rfl:  Social History   Socioeconomic History   Marital status: Married    Spouse name: [name]   Number of children: 2   Years of education: Not on file   Highest education level: Associate degree: academic program  Occupational History   Occupation: Geologist, engineering    Comment: retired  Tobacco Use   Smoking status: Former    Current packs/day: 0.00    Types: Cigarettes    Quit date: 1990    Years since quitting: 35.2   Smokeless tobacco: Never  Vaping Use   Vaping status: Never Used  Substance and Sexual Activity   Alcohol use: No   Drug use: No   Sexual activity: Not on file  Other Topics Concern   Not on  file  Social History Narrative   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 She lives at home with her husband [name] and her grown son. She has 3 children. She enjoys bird watching and walking.    Social Drivers of Corporate investment banker Strain: Low Risk  (02/22/2023)   Overall Financial Resource Strain (CARDIA)    Difficulty of Paying Living Expenses: Not hard at all  Food Insecurity: No Food Insecurity (02/22/2023)   Hunger Vital Sign    Worried About Running Out of Food in the Last Year: Never true    Ran Out of Food in the Last Year: Never true  Transportation Needs: No Transportation Needs (02/22/2023)   PRAPARE - Administrator, Civil Service (Medical): No    Lack of Transportation (Non-Medical): No  Physical Activity: Insufficiently Active (02/22/2023)   Exercise Vital Sign    Days of Exercise per Week: 3 days    Minutes of Exercise per Session: 30 min  Stress: No Stress Concern Present (02/22/2023)   [employer] of Occupational Health - Occupational Stress Questionnaire    Feeling of Stress : Not at all  Social Connections: Moderately Integrated (02/22/2023)   Social Connection and Isolation Panel [NHANES]    Frequency of Communication with Friends and Family: More than three times a week    Frequency of Social Gatherings with Friends and Family: More than three times a week    Attends Religious Services: More than 4 times per year    Active Member of [employer] or Organizations: No    Attends Banker Meetings: Never    Marital Status: Married  Catering manager Violence: Not At Risk (02/22/2023)   Humiliation, Afraid, Rape, and Kick questionnaire    Fear of Current or Ex-Partner: No    Emotionally Abused: No    Physically Abused: No    Sexually Abused: No   Family History  Problem Relation Age of Onset   Coronary artery disease Mother    Diabetes Mother    Heart disease Mother    Coronary artery disease Father    Heart disease Father    Diabetes Sister     Hypertension Sister    Heart disease Sister    Diabetes Brother    Diabetes Sister    Arthritis Sister    Hypertension Sister    Diabetes Sister    Hypertension Sister    Hypertension Sister     Objective: Office vital signs reviewed. BP 134/67   Pulse 73   Temp 98.4 F (36.9 C)   Ht 5\' 7"  (1.702 m)  Wt 204 lb (92.5 kg)   SpO2 96%   BMI 31.95 kg/m   Physical Examination:  General: Awake, alert, well nourished, No acute distress HEENT: sclera white, MMM Cardio: regular rate and rhythm, S1S2 heard, no murmurs appreciated Pulm: clear to auscultation bilaterally, no wheezes, rhonchi or rales; normal work of breathing on room air Extremities: Trace lateral ankle edema on the right compared to the left  Diabetic Foot Exam - Simple   Simple Foot Form Diabetic Foot exam was performed with the following findings: Yes 02/03/2024  9:44 AM  Visual Inspection No deformities, no ulcerations, no other skin breakdown bilaterally: Yes Sensation Testing Intact to touch and monofilament testing bilaterally: Yes Pulse Check Posterior Tibialis and Dorsalis pulse intact bilaterally: Yes Comments      Assessment/ Plan: 73 y.o. female   Diabetes mellitus treated with injections of non-insulin medication (HCC) - Plan: Bayer DCA Hb A1c Waived, glipiZIDE (GLUCOTROL XL) 10 MG 24 hr tablet, metFORMIN (GLUCOPHAGE-XR) 500 MG 24 hr tablet, Semaglutide, 2 MG/DOSE, 8 MG/3ML SOPN  Hyperlipidemia associated with type 2 diabetes mellitus (HCC) - Plan: CMP14+EGFR, Lipid Panel, pravastatin (PRAVACHOL) 40 MG tablet  Hypertension associated with diabetes (HCC) - Plan: CMP14+EGFR, amLODipine (NORVASC) 10 MG tablet, hydrochlorothiazide (HYDRODIURIL) 25 MG tablet  Sugar now under control with A1c dropping to 7.0.  Continue current regimen.  Meds have been renewed for patient.  Labs collected.  Pravachol renewed  Blood pressure controlled.  No changes.  Refill sent  Tetanus shot updated  today  Pansy Ostrovsky Hulen Skains, DO [facility] [phone]

## 2024-02-06 ENCOUNTER — Encounter: Payer: Self-pay | Admitting: Family Medicine

## 2024-02-23 ENCOUNTER — Ambulatory Visit: Payer: Medicare HMO

## 2024-02-23 VITALS — BP 135/78 | HR 82 | Ht 67.0 in | Wt 204.0 lb

## 2024-02-23 DIAGNOSIS — Z0000 Encounter for general adult medical examination without abnormal findings: Secondary | ICD-10-CM | POA: Diagnosis not present

## 2024-02-23 NOTE — Progress Notes (Signed)
 Subjective:   Erica Evans is a 73 y.o. who presents for a Medicare Wellness preventive visit.  Visit Complete: Virtual I connected with  Erica Evans on 02/23/24 by a audio enabled telemedicine application and verified that I am speaking with the correct person using two identifiers.  Patient Location: Home  Provider Location: Home Office  I discussed the limitations of evaluation and management by telemedicine. The patient expressed understanding and agreed to proceed.  Vital Signs: Because this visit was a virtual/telehealth visit, some criteria may be missing or patient reported. Any vitals not documented were not able to be obtained and vitals that have been documented are patient reported.  VideoDeclined- This patient declined Librarian, academic. Therefore the visit was completed with audio only.  Persons Participating in Visit: Patient.  AWV Questionnaire: No: Patient Medicare AWV questionnaire was not completed prior to this visit.  Cardiac Risk Factors include: advanced age (>3men, >58 women);diabetes mellitus;hypertension;obesity (BMI >30kg/m2)     Objective:    Today's Vitals   02/23/24 0807 02/23/24 0812  BP: 135/78   Pulse: 82   Weight: 204 lb (92.5 kg)   Height: 5\' 7"  (1.702 m)   PainSc:  1    Body mass index is 31.95 kg/m.     02/23/2024    8:29 AM 07/12/2023   10:20 AM 02/22/2023    8:51 AM 12/16/2022    6:01 AM 12/13/2022   11:43 AM 02/18/2022    9:30 AM 02/16/2021    9:41 AM  Advanced Directives  Does Patient Have a Medical Advance Directive? Yes No No Yes Yes Yes Yes  Type of Estate agent of [place];Living will   Living will Living will Healthcare Power of [place];Living will Healthcare Power of [place];Living will  Does patient want to make changes to medical advance directive?    No - Patient declined No - Patient declined    Copy of Healthcare Power of Attorney in Chart? No - copy  requested     No - copy requested No - copy requested  Would patient like information on creating a medical advance directive?  No - Patient declined No - Patient declined        Current Medications (verified) Outpatient Encounter Medications as of 02/23/2024  Medication Sig   amLODipine (NORVASC) 10 MG tablet Take 1 tablet (10 mg total) by mouth daily.   ASPIRIN EC PO Take 325 mg by mouth at bedtime. Swallow whole.   blood glucose meter kit and supplies Check BS once daily   busPIRone (BUSPAR) 15 MG tablet TAKE 1 TABLET BY MOUTH 3 TIMES DAILY.   Cholecalciferol (D-3-5) 125 MCG (5000 UT) capsule Take 5,000 Units by mouth in the morning.   cyclobenzaprine (FLEXERIL) 5 MG tablet Take 1 tablet (5 mg total) by mouth at bedtime as needed for muscle spasms.   glipiZIDE (GLUCOTROL XL) 10 MG 24 hr tablet Take 1 tablet (10 mg total) by mouth daily with breakfast.   glucose blood (ONETOUCH VERIO) test strip TEST BLOOD SUGAR ONCE DAILY DX E11.9   hydrochlorothiazide (HYDRODIURIL) 25 MG tablet Take 1 tablet (25 mg total) by mouth daily.   lidocaine (LIDODERM) 5 % PLACE 1 PATCH ONTO THE SKIN DAILY. REMOVE & DISCARD PATCH WITHIN 12 HOURS OR AS DIRECTED BY MD   metFORMIN (GLUCOPHAGE-XR) 500 MG 24 hr tablet Take 2 tablets (1,000 mg total) by mouth daily with supper.   oxyCODONE-acetaminophen (PERCOCET) 5-325 MG tablet Take 1  tablet by mouth every 6 (six) hours as needed for severe pain.   pravastatin (PRAVACHOL) 40 MG tablet Take 1 tablet (40 mg total) by mouth daily.   Semaglutide, 2 MG/DOSE, 8 MG/3ML SOPN Inject 2 mg as directed once a week. Patient assistance program   No facility-administered encounter medications on file as of 02/23/2024.    Allergies (verified) Jardiance [empagliflozin], Lipitor [atorvastatin], Morphine and codeine, Pioglitazone, and Farxiga [dapagliflozin]   History: Past Medical History:  Diagnosis Date   Arthritis    Coronary artery disease    COVID-19 2022   Diabetes  mellitus without complication (HCC)    GERD (gastroesophageal reflux disease)    Hyperlipidemia    Hypertension    Palpitations    Vertigo    Past Surgical History:  Procedure Laterality Date   CATARACT EXTRACTION W/PHACO Left 07/15/2023   Procedure: CATARACT EXTRACTION PHACO AND INTRAOCULAR LENS PLACEMENT (IOC);  Surgeon: Fabio Pierce, MD;  Location: [facility];  Service: Ophthalmology;  Laterality: Left;  CDE 18.83   ENDARTERECTOMY Right 12/16/2022   Procedure: RIGHT CAROTID ENDARTERECTOMY;  Surgeon: Victorino Sparrow, MD;  [facility];  Service: Vascular;  Laterality: Right;   Family History  Problem Relation Age of Onset   Coronary artery disease Mother    Diabetes Mother    Heart disease Mother    Coronary artery disease Father    Heart disease Father    Diabetes Sister    Hypertension Sister    Heart disease Sister    Diabetes Brother    Diabetes Sister    Arthritis Sister    Hypertension Sister    Diabetes Sister    Hypertension Sister    Hypertension Sister    Social History   Socioeconomic History   Marital status: Married    Spouse name: Erica Evans   Number of children: 2   Years of education: Not on file   Highest education level: Associate degree: academic program  Occupational History   Occupation: Geologist, engineering    Comment: retired  Tobacco Use   Smoking status: Former    Current packs/day: 0.00    Types: Cigarettes    Quit date: 1990    Years since quitting: 35.2   Smokeless tobacco: Never  Vaping Use   Vaping status: Never Used  Substance and Sexual Activity   Alcohol use: No   Drug use: No   Sexual activity: Not on file  Other Topics Concern   Not on file  Social History Narrative   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 She has 3 children. She enjoys bird watching and walking.    Social Drivers of Corporate investment banker Strain: Low Risk  (02/23/2024)   Overall Financial  Resource Strain (CARDIA)    Difficulty of Paying Living Expenses: Not hard at all  Food Insecurity: No Food Insecurity (02/23/2024)   Hunger Vital Sign    Worried About Running Out of Food in the Last Year: Never true    Ran Out of Food in the Last Year: Never true  Transportation Needs: No Transportation Needs (02/23/2024)   PRAPARE - Administrator, Civil Service (Medical): No    Lack of Transportation (Non-Medical): No  Physical Activity: Insufficiently Active (02/22/2023)   Exercise Vital Sign    Days of Exercise per Week: 3 days    Minutes of Exercise per Session: 30 min  Stress: No Stress Concern Present (  02/23/2024)   [employer] [employer] of Occupational Health - Occupational Stress Questionnaire    Feeling of Stress : Only a little  Social Connections: Moderately Integrated (02/23/2024)   Social Connection and Isolation Panel [NHANES]    Frequency of Communication with Friends and Family: More than three times a week    Frequency of Social Gatherings with Friends and Family: Twice a week    Attends Religious Services: More than 4 times per year    Active Member of [employer] or Organizations: No    Attends Engineer, structural: Never    Marital Status: Married    Tobacco Counseling Counseling given: Yes    Clinical Intake:  Pre-visit preparation completed: Yes  Pain : 0-10 (pt do have chronic back pain) Pain Score: 1  (per pt taken muscle relaxer) Pain Type: Chronic pain Pain Location: Back Pain Descriptors / Indicators: Burning Pain Frequency: Constant Pain Relieving Factors: muscle relaxer Effect of Pain on Daily Activities: no  Pain Relieving Factors: muscle relaxer  BMI - recorded: 31.93 Nutritional Status: BMI > 30  Obese Nutritional Risks: None Diabetes: Yes CBG done?:  (120 before meal)  Lab Results  Component Value Date   HGBA1C 7.0 (H) 02/03/2024   HGBA1C 7.7 (H) 11/04/2023   HGBA1C 7.8 (H) 07/12/2023     How often do you need to have  someone help you when you read instructions, pamphlets, or other written materials from your doctor or pharmacy?: 1 - Never  Interpreter Needed?: No  Information entered by :: Alia T/CMA   Activities of Daily Living     02/23/2024    8:27 AM 07/12/2023   10:25 AM  In your present state of health, do you have any difficulty performing the following activities:  Hearing? 0   Vision? 0   Difficulty concentrating or making decisions? 0   Walking or climbing stairs? 0   Dressing or bathing? 0   Doing errands, shopping? 0 0  Preparing Food and eating ? N   Using the Toilet? N   In the past six months, have you accidently leaked urine? Y   Do you have problems with loss of bowel control? N   Managing your Medications? N   Managing your Finances? N   Housekeeping or managing your Housekeeping? N     Patient Care Team: Raliegh Ip, DO as PCP - General (Family Medicine) Marcelino Duster, MD as Referring Physician (Dermatology) Dr Desiree Lucy Optometrist, [place], OD (Optometry) [place], Lilla Shook, [facility] as Pharmacist (Family Medicine) Adam Phenix, DPM as Consulting Physician (Podiatry)  Indicate any recent Medical Services you may have received from other than [facility] providers in the past year (date may be approximate).     Assessment:   This is a routine wellness examination for Divina.  Hearing/Vision screen Hearing Screening - Comments:: Pt denies hearing def Vision Screening - Comments:: Pt denies vision def Pt goes Dr. Earlene Plater in [place], [place]   Goals Addressed             This Visit's Progress    Patient Stated   On track    02/18/2022  AWV Goal: Exercise for General Health  Patient will verbalize understanding of the benefits of increased physical activity: Exercising regularly is important. It will improve your overall fitness, flexibility, and endurance. Regular exercise also will improve your overall health. It can help you control your weight, reduce stress, and  improve your bone density. Over the next year, patient will increase physical activity  as tolerated with a goal of at least 150 minutes of moderate physical activity per week.  You can tell that you are exercising at a moderate intensity if your heart starts beating faster and you start breathing faster but can still hold a conversation. Moderate-intensity exercise ideas include: Walking 1 mile (1.6 km) in about 15 minutes Biking Hiking Golfing Dancing Water aerobics Patient will verbalize understanding of everyday activities that increase physical activity by providing examples like the following: Yard work, such as: Insurance underwriter Gardening Washing windows or floors Patient will be able to explain general safety guidelines for exercising:  Before you start a new exercise program, talk with your health care provider. Do not exercise so much that you hurt yourself, feel dizzy, or get very short of breath. Wear comfortable clothes and wear shoes with good support. Drink plenty of water while you exercise to prevent dehydration or heat stroke. Work out until your breathing and your heartbeat get faster.        Depression Screen     02/23/2024    8:19 AM 02/03/2024    9:07 AM 11/04/2023   11:35 AM 07/12/2023   10:45 AM 03/15/2023   11:14 AM 02/22/2023    8:50 AM 02/07/2023    8:42 AM  PHQ 2/9 Scores  PHQ - 2 Score 0 0 0 0 0 0 0  PHQ- 9 Score 1 0 0 0 0 0 0    Fall Risk     02/23/2024    8:18 AM 02/03/2024    9:07 AM 11/04/2023   11:35 AM 07/12/2023   10:45 AM 02/22/2023    8:47 AM  Fall Risk   Falls in the past year? 0 0 0 0 0  Number falls in past yr: 0 0 0 0 0  Injury with Fall? 0 0 0 0 0  Risk for fall due to : No Fall Risks No Fall Risks No Fall Risks No Fall Risks No Fall Risks  Follow up Falls prevention discussed;Falls evaluation completed Falls evaluation completed Education provided  Education provided Falls prevention discussed    MEDICARE RISK AT HOME:  Medicare Risk at Home Any stairs in or around the home?: Yes If so, are there any without handrails?: Yes Home free of loose throw rugs in walkways, pet beds, electrical cords, etc?: Yes Adequate lighting in your home to reduce risk of falls?: Yes Life alert?: No Use of a cane, walker or w/c?: No Grab bars in the bathroom?: No Shower chair or bench in shower?: No Elevated toilet seat or a handicapped toilet?: Yes  TIMED UP AND GO:  Was the test performed?  No  Cognitive Function: 6CIT completed        02/23/2024    8:36 AM 02/22/2023    8:51 AM 02/18/2022    9:25 AM 02/16/2021    9:46 AM 02/14/2020    9:52 AM  6CIT Screen  What Year? 0 points 0 points 0 points 0 points 0 points  What month? 0 points 0 points 0 points 0 points 0 points  What time? 0 points 0 points 0 points 0 points 0 points  Count back from 20 0 points 0 points 0 points 0 points 0 points  Months in reverse 0 points 0 points 0 points 0 points 0 points  Repeat phrase 0 points 0 points 0 points 0 points 0 points  Total Score 0 points  0 points 0 points 0 points 0 points    Immunizations Immunization History  Administered Date(s) Administered   Fluad Quad(high Dose 65+) 08/15/2019, 08/29/2020, 08/17/2021, 09/08/2022   Influenza Inj Mdck Quad Pf 09/01/2017   Influenza Split 09/21/2013   Influenza, High Dose Seasonal PF 08/24/2018   Influenza,inj,Quad PF,6+ Mos 09/19/2014, 09/03/2015, 08/04/2016   Influenza-Unspecified 08/30/2017, 08/09/2023   Moderna Covid-19 Vaccine Bivalent Booster 56yrs & up 10/14/2021   Moderna SARS-COV2 Booster Vaccination 06/17/2021, 08/09/2023   Moderna Sars-Covid-2 Vaccination 01/09/2020, 02/06/2020, 10/01/2020   PNEUMOCOCCAL CONJUGATE-20 11/04/2023   Pneumococcal Conjugate-13 10/20/2012, 10/17/2018   Pneumococcal Polysaccharide-23 10/20/2012   Tdap 07/06/2011, 02/03/2024    Screening Tests Health Maintenance   Topic Date Due   Zoster Vaccines- Shingrix (1 of 2) 05/05/2024 (Originally 09/10/2002)   COVID-19 Vaccine (7 - 2024-25 season) 03/10/2025 (Originally 10/04/2023)   DEXA SCAN  05/14/2024   INFLUENZA VACCINE  06/15/2024   OPHTHALMOLOGY EXAM  06/19/2024   Diabetic kidney evaluation - Urine ACR  07/11/2024   HEMOGLOBIN A1C  08/05/2024   MAMMOGRAM  11/01/2024   Diabetic kidney evaluation - eGFR measurement  02/02/2025   FOOT EXAM  02/02/2025   Medicare Annual Wellness (AWV)  02/22/2025   Fecal DNA (Cologuard)  02/25/2025   DTaP/Tdap/Td (3 - Td or Tdap) 02/02/2034   Pneumonia Vaccine 9+ Years old  Completed   Hepatitis C Screening  Completed   HPV VACCINES  Aged Out   Meningococcal B Vaccine  Aged Out    Health Maintenance  There are no preventive care reminders to display for this patient.  Health Maintenance Items Addressed: See Nurse Notes  Additional Screening:  Vision Screening: Recommended annual ophthalmology exams for early detection of glaucoma and other disorders of the eye.  Dental Screening: Recommended annual dental exams for proper oral hygiene  Community Resource Referral / Chronic Care Management: CRR required this visit?  Yes   CCM required this visit?  No     Plan:     I have personally reviewed and noted the following in the patient's chart:   Medical and social history Use of alcohol, tobacco or illicit drugs  Current medications and supplements including opioid prescriptions. Patient is not currently taking opioid prescriptions. Functional ability and status Nutritional status Physical activity Advanced directives List of other physicians Hospitalizations, surgeries, and ER visits in previous 12 months Vitals Screenings to include cognitive, depression, and falls Referrals and appointments  In addition, I have reviewed and discussed with patient certain preventive protocols, quality metrics, and best practice recommendations. A written  personalized care plan for preventive services as well as general preventive health recommendations were provided to patient.     Arta Silence, CMA   02/23/2024   After Visit Summary: (MyChart) Due to this being a telephonic visit, the after visit summary with patients personalized plan was offered to patient via MyChart   Notes:  Pt was encourage to get her Shingles Vaccine. [id number] place due SDOH

## 2024-02-23 NOTE — Patient Instructions (Signed)
 [name] , Thank you for taking time to come for your Medicare Wellness Visit. I appreciate your ongoing commitment to your health goals. Please review the following plan we discussed and let me know if I can assist you in the future.   Referrals/Orders/Follow-Ups/Clinician Recommendations: Please make an appointment to update your vaccine: Shingles Vaccine.  This is a list of the screening recommended for you and due dates:  Health Maintenance  Topic Date Due   Zoster (Shingles) Vaccine (1 of 2) 05/05/2024*   COVID-19 Vaccine (7 - 2024-25 season) 03/10/2025*   DEXA scan (bone density measurement)  05/14/2024   Flu Shot  06/15/2024   Eye exam for diabetics  06/19/2024   Yearly kidney health urinalysis for diabetes  07/11/2024   Hemoglobin A1C  08/05/2024   Mammogram  11/01/2024   Yearly kidney function blood test for diabetes  02/02/2025   Complete foot exam   02/02/2025   Medicare Annual Wellness Visit  02/22/2025   Cologuard (Stool DNA test)  02/25/2025   DTaP/Tdap/Td vaccine (3 - Td or Tdap) 02/02/2034   Pneumonia Vaccine  Completed   Hepatitis C Screening  Completed   HPV Vaccine  Aged Out   Meningitis B Vaccine  Aged Out  *Topic was postponed. The date shown is not the original due date.    Advanced directives: (Copy Requested) Please bring a copy of your health care power of attorney and living will to the office to be added to your chart at your convenience. You can mail to [facility] [address] [place], [place] [address] or email to [email or web address]  Next Medicare Annual Wellness Visit scheduled for next year: Yes

## 2024-02-29 ENCOUNTER — Other Ambulatory Visit: Payer: Self-pay | Admitting: Family Medicine

## 2024-02-29 DIAGNOSIS — F411 Generalized anxiety disorder: Secondary | ICD-10-CM

## 2024-03-06 ENCOUNTER — Telehealth: Payer: Self-pay

## 2024-03-06 NOTE — Progress Notes (Signed)
 Complex Care Management Note  Care Guide Note 03/06/2024 Name: Erica Evans MRN: [id number] DOB: May 29, 1951  Erica Evans is a 73 y.o. year old female who sees Eliodoro Guerin, DO for primary care. I reached out to Dorisann Garre by phone today to offer complex care management services.  Erica Evans was given information about Complex Care Management services today including:   The Complex Care Management services include support from the care team which includes your Nurse Care Manager, Clinical Social Worker, or Pharmacist.  The Complex Care Management team is here to help remove barriers to the health concerns and goals most important to you. Complex Care Management services are voluntary, and the patient may decline or stop services at any time by request to their care team member.   Complex Care Management Consent Status: Patient agreed to services and verbal consent obtained.   Follow up plan:  Telephone appointment with complex care management team member scheduled for:  03/23/2024  Encounter Outcome:  Patient Scheduled  Lenton Rail , RMA     [place]  [facility], [facility] Guide  Direct Dial: [phone]  Website: [email or web address]

## 2024-03-06 NOTE — Progress Notes (Signed)
 Complex Care Management Note Care Guide Note  03/06/2024 Name: Erica Evans MRN: [id number] DOB: 12/25/1950  [name] is a 73 y.o. year old female who is a primary care patient of Eliodoro Guerin, DO . The community resource team was consulted for assistance with Food Insecurity  SDOH screenings and interventions completed:  Yes  Social Drivers of Health From This Encounter   Food Insecurity: No Food Insecurity (03/06/2024)   Hunger Vital Sign    Worried About Running Out of Food in the Last Year: Never true    Ran Out of Food in the Last Year: Never true  Housing: Low Risk  (03/06/2024)   Housing Stability Vital Sign    Unable to Pay for Housing in the Last Year: No    Number of Times Moved in the Last Year: 0    Homeless in the Last Year: No  Financial Resource Strain: Medium Risk (03/06/2024)   Overall Financial Resource Strain (CARDIA)    Difficulty of Paying Living Expenses: Somewhat hard  Transportation Needs: No Transportation Needs (03/06/2024)   PRAPARE - Transportation    Lack of Transportation (Medical): No    Lack of Transportation (Non-Medical): No  Utilities: Not At Risk (03/06/2024)   Utilities    Threatened with loss of utilities: No    SDOH Interventions Today    Flowsheet Row Most Recent Value  SDOH Interventions   Food Insecurity Interventions Other (Comment)  [Patient stated she did not qualify for food stamps due to income, but she is interested in a food pantry list. Verified home address to mail food pantry list.]        Care guide performed the following interventions: Spoke with patient about local food pantries and food stamps. Patient stated she did not qualify for food stamps due to income, but she is interested in a food pantry list. Verified home address to mail food pantry list.  Follow Up Plan:  No further follow up planned at this time. The patient has been provided with needed resources.  Encounter Outcome:  Patient  Visit Completed  [name]  [facility] Guide Direct Dial: [phone]  Fax: [phone] Website: [email or web address]

## 2024-03-22 ENCOUNTER — Other Ambulatory Visit (INDEPENDENT_AMBULATORY_CARE_PROVIDER_SITE_OTHER): Payer: Self-pay

## 2024-03-23 ENCOUNTER — Telehealth: Payer: Self-pay | Admitting: Family Medicine

## 2024-03-23 ENCOUNTER — Telehealth: Payer: Self-pay | Admitting: *Deleted

## 2024-03-23 ENCOUNTER — Encounter: Payer: Self-pay | Admitting: *Deleted

## 2024-03-23 NOTE — Telephone Encounter (Unsigned)
 Copied from CRM [id number]. Topic: Appointments - Appointment Scheduling >> Mar 23, 2024  1:40 PM Turkey B wrote: Pt ran late for her appt with to take care of something. Please cb appt with Grandville Lax

## 2024-04-13 ENCOUNTER — Encounter: Payer: Self-pay | Admitting: *Deleted

## 2024-04-13 ENCOUNTER — Other Ambulatory Visit: Payer: Self-pay | Admitting: *Deleted

## 2024-04-13 ENCOUNTER — Other Ambulatory Visit: Payer: Self-pay

## 2024-04-13 NOTE — Patient Outreach (Signed)
 Complex Care Management   Visit Note  04/13/2024  Name:  Erica Evans MRN: [id number] DOB: 02-26-1951  Situation: Referral received for Complex Care Management related to SDOH Barriers:  Food insecurity I obtained verbal consent from Patient.  Visit completed with patient  on the phone  Background:   Past Medical History:  Diagnosis Date   Arthritis    Coronary artery disease    COVID-19 2022   Diabetes mellitus without complication (HCC)    GERD (gastroesophageal reflux disease)    Hyperlipidemia    Hypertension    Palpitations    Vertigo     Assessment: Patient Reported Symptoms:  Cognitive Cognitive Status: Able to follow simple commands, Alert and oriented to person, place, and time, Insightful and able to interpret abstract concepts, Normal speech and language skills Cognitive/Intellectual Conditions Management [RPT]: None reported or documented in medical history or problem list   Health Maintenance Behaviors: Annual physical exam  Neurological Neurological Review of Symptoms: No symptoms reported    HEENT HEENT Symptoms Reported: No symptoms reported      Cardiovascular Cardiovascular Symptoms Reported: Lightheadness Does patient have uncontrolled Hypertension?: No Cardiovascular Conditions: Hypertension, High blood cholesterol Cardiovascular Management Strategies: Medication therapy, Routine screening  Respiratory Respiratory Symptoms Reported: No symptoms reported Respiratory Conditions: Seasonal allergies  Endocrine Patient reports the following symptoms related to hypoglycemia or hyperglycemia : No symptoms reported Is patient diabetic?: Yes Is patient checking blood sugars at home?: Yes Endocrine Conditions: Diabetes Endocrine Management Strategies: Routine screening, Medication therapy  Gastrointestinal Gastrointestinal Symptoms Reported: No symptoms reported Gastrointestinal Conditions: Constipation Gastrointestinal Management Strategies:  Medication therapy Nutrition Risk Screen (CP): No indicators present  Genitourinary Genitourinary Symptoms Reported: No symptoms reported    Integumentary Integumentary Symptoms Reported: No symptoms reported    Musculoskeletal Musculoskelatal Symptoms Reviewed: No symptoms reported   Falls in the past year?: No Number of falls in past year: 1 or less Was there an injury with Fall?: No Fall Risk Category Calculator: 0 Patient Fall Risk Level: Low Fall Risk Patient at Risk for Falls Due to: No Fall Risks Fall risk Follow up: Falls evaluation completed  Psychosocial Psychosocial Symptoms Reported: No symptoms reported     Quality of Family Relationships: involved, helpful, supportive Do you feel physically threatened by others?: No      04/13/2024   11:12 AM  Depression screen PHQ 2/9  Decreased Interest 0  Down, Depressed, Hopeless 0  PHQ - 2 Score 0    Vitals:   04/11/24 1044  BP: (!) 147/60  Pulse: (!) 102    Medications Reviewed Today     Reviewed by Remona Carmel, RN (Registered Nurse) on 04/13/24 at 1051  Med List Status: <None>   Medication Order Taking? Sig Documenting Provider Last Dose Status Informant  amLODipine  (NORVASC ) 10 MG tablet [id number] Yes Take 1 tablet (10 mg total) by mouth daily. Eliodoro Guerin, DO Taking Active   ASPIRIN  EC PO [id number] Yes Take 325 mg by mouth at bedtime. Swallow whole. [provider] Taking Active Self  blood glucose meter kit and supplies [id number] Yes Check BS once daily Leoma Raja, MD Taking Active Self  busPIRone  (BUSPAR ) 15 MG tablet [id number] Yes TAKE 1 TABLET BY MOUTH THREE TIMES A DAY  Patient taking differently: Take 15 mg by mouth daily.   Vicky Grange M, DO Taking Active   Cholecalciferol (D-3-5) 125 MCG (5000 UT) capsule [id number] Yes Take 5,000 Units by mouth in the morning. [provider]  Taking Active Self  cyclobenzaprine  (FLEXERIL ) 5 MG tablet [id number] Yes Take 1 tablet  (5 mg total) by mouth at bedtime as needed for muscle spasms. Vicky Grange M, DO Taking Active   glipiZIDE  (GLUCOTROL  XL) 10 MG 24 hr tablet [id number] Yes Take 1 tablet (10 mg total) by mouth daily with breakfast. Eliodoro Guerin, DO Taking Active   glucose blood [facility] VERIO) test strip [id number] Yes TEST BLOOD SUGAR ONCE DAILY DX E11.9 Vicky Grange M, DO Taking Active   hydrochlorothiazide  (HYDRODIURIL ) 25 MG tablet [id number] Yes Take 1 tablet (25 mg total) by mouth daily. Vicky Grange M, DO Taking Active   lidocaine  (LIDODERM ) 5 % [id number] Yes PLACE 1 PATCH ONTO THE SKIN DAILY. REMOVE & DISCARD PATCH WITHIN 12 HOURS OR AS DIRECTED BY MD Vicky Grange M, DO Taking Active   metFORMIN  (GLUCOPHAGE -XR) 500 MG 24 hr tablet [id number] Yes Take 2 tablets (1,000 mg total) by mouth daily with supper. Vicky Grange M, DO Taking Active   oxyCODONE -acetaminophen  (PERCOCET) 5-325 MG tablet [id number] No Take 1 tablet by mouth every 6 (six) hours as needed for severe pain.  Patient not taking: Reported on 04/13/2024   Bonnye Butts, PA-C Not Taking Consider Medication Status and Discontinue   pravastatin  (PRAVACHOL ) 40 MG tablet [id number] Yes Take 1 tablet (40 mg total) by mouth daily. Eliodoro Guerin, DO Taking Active   Semaglutide , 2 MG/DOSE, 8 MG/3ML SOPN [id number] Yes Inject 2 mg as directed once a week. Patient assistance program Eliodoro Guerin, DO Taking Active             Recommendation:   PCP Follow-up Continue Current Plan of Care  Follow Up Plan:   Telephone follow up appointment date/time:  04-27-2024 at 10:30 am  Grandville Lax, BSN RN [facility], [facility] RN Care Manager Direct Dial: [phone]  Fax: [phone]

## 2024-04-13 NOTE — Patient Instructions (Signed)
 Visit Information  Thank you for taking time to visit with me today. Please don't hesitate to contact me if I can be of assistance to you before our next scheduled appointment.  Our next appointment is by telephone on 04-27-2024 at 10:30 am Please call the care guide team at [phone] if you need to cancel or reschedule your appointment.   Following is a copy of your care plan:   Goals Addressed             This Visit's Progress    VBCI RN Care Plan: DM   Improving    Problems:  Chronic Disease Management support and education needs related to DMII  Goal: Over the next 90 days the Patient will attend all scheduled medical appointments: with primary care provider and specialist as evidenced by keeping all scheduled appointments        continue to work with RN Care Manager and/or Social Worker to address care management and care coordination needs related to DMII as evidenced by adherence to care management team scheduled appointments     take all medications exactly as prescribed and will call provider for medication related questions as evidenced by compliance with all medications    verbalize basic understanding of DMII disease process and self health management plan as evidenced by verbal explanation, recognizing/monitoring symptoms, lifestyle modifications  Interventions:   Diabetes Interventions: Assessed patient's understanding of A1c goal: <7% Provided education to patient about basic DM disease process. Patient reports checking blood sugar daily and states that with the Ozempic  she has noticed her readings have improved greatly. She repots fasting blood sugar this morning of 130. RNCM reviewed with patient goal fasting <130 and post prandial <180. Reviewed medications with patient and discussed importance of medication adherence. Reports compliance with all medications Counseled on importance of regular laboratory monitoring as prescribed. Labs up to date Discussed plans with  patient for ongoing care management follow up and provided patient with direct contact information for care management team Provided patient with written educational materials related to hypo and hyperglycemia and importance of correct treatment. Denies hypo/hyperglycemia. Reviewed scheduled/upcoming provider appointments including: 05-07-2024 with PCP Advised patient, providing education and rationale, to check cbg daily fasting and record, calling provider for findings outside established parameters.  Review of patient status, including review of consultants reports, relevant laboratory and other test results, and medications completed Screening for signs and symptoms of depression related to chronic disease state  Assessed social determinant of health barriers Lab Results  Component Value Date   HGBA1C 7.0 (H) 02/03/2024    Patient Self-Care Activities:  Attend all scheduled provider appointments Attend church or other social activities Call pharmacy for medication refills 3-7 days in advance of running out of medications Call provider office for new concerns or questions  Perform all self care activities independently  Perform IADL's (shopping, preparing meals, housekeeping, managing finances) independently Take medications as prescribed   schedule appointment with eye doctor check blood sugar at prescribed times: once daily and when you have symptoms of low or high blood sugar check feet daily for cuts, sores or redness enter blood sugar readings and medication or insulin  into daily log fill half of plate with vegetables keep a food diary limit fast food meals to no more than 1 per week manage portion size prepare main meal at home 3 to 5 days each week set a realistic goal switch to low-fat or skim milk switch to sugar-free drinks wash and dry feet carefully every  day  Plan:  Telephone follow up appointment with care management team member scheduled for:  04-27-2024 at 10:30  am          VBCI RN Care Plan: HTN   Improving    Problems:  Chronic Disease Management support and education needs related to HTN  Goal: Over the next 90 days the Patient will attend all scheduled medical appointments: with primary care provider and specialist as evidenced by keeping all scheduled appointments        continue to work with RN Care Manager and/or Social Worker to address care management and care coordination needs related to HTN as evidenced by adherence to care management team scheduled appointments     take all medications exactly as prescribed and will call provider for medication related questions as evidenced by compliance with all medications    verbalize basic understanding of HTN disease process and self health management plan as evidenced by verbal explanation, recognizing/monitoring symptoms, lifestyle changes  Interventions:   Hypertension Interventions: Last practice recorded BP readings:  BP Readings from Last 3 Encounters:  04/11/24 (!) 147/60  02/23/24 135/78  02/03/24 134/67   Most recent eGFR/CrCl:  Lab Results  Component Value Date   EGFR 67 02/03/2024    No components found for: "CRCL"  Evaluation of current treatment plan related to hypertension self management and patient's adherence to plan as established by provider. Reports periodically checking BP. Reports episode a few days ago of  lightheadedness and states her fitbit notified her that her HR was above 100. She reports that this will sometimes happen when she is just sitting. Patient states that she plans to address this with the PCP during her next office visit. RNCM advised checking BP & HR daily and recording results. Provided education to patient re: stroke prevention, s/s of heart attack and stroke. Education and support provided Reviewed medications with patient and discussed importance of compliance. Reports compliance with all medications Counseled on adverse effects of illicit drug  and excessive alcohol use in patients with high blood pressure. Denies Counseled on the importance of exercise goals with target of 150 minutes per week Discussed plans with patient for ongoing care management follow up and provided patient with direct contact information for care management team Advised patient, providing education and rationale, to monitor blood pressure daily and record, calling PCP for findings outside established parameters Reviewed scheduled/upcoming provider appointments including: 05-07-2024 with PCP Advised patient to discuss acute changes, elevated home BP readings with provider Provided education on prescribed diet DASH diet Discussed complications of poorly controlled blood pressure such as heart disease, stroke, circulatory complications, vision complications, kidney impairment, sexual dysfunction Screening for signs and symptoms of depression related to chronic disease state  Assessed social determinant of health barriers  Patient Self-Care Activities:  Attend all scheduled provider appointments Attend church or other social activities Call pharmacy for medication refills 3-7 days in advance of running out of medications Call provider office for new concerns or questions  Perform all self care activities independently  Perform IADL's (shopping, preparing meals, housekeeping, managing finances) independently Take medications as prescribed   check blood pressure daily write blood pressure results in a log or diary take blood pressure log to all doctor appointments call doctor for signs and symptoms of high blood pressure take medications for blood pressure exactly as prescribed begin an exercise program report new symptoms to your doctor eat more whole grains, fruits and vegetables, lean meats and healthy fats  Plan:  Telephone follow up appointment with care management team member scheduled for:  04-27-2024 at 10:30 am             Please call the  Suicide and Crisis Lifeline: 988 call the [place]  National Suicide Prevention Lifeline: (510)763-9135 or TTY: 564-090-7411 TTY (972)545-5149) to talk to a trained counselor call 1-800-273-TALK (toll free, 24 hour hotline) call the [facility]: (743)104-8117 call 911 if you are experiencing a Mental Health or Behavioral Health Crisis or need someone to talk to.  Patient verbalizes understanding of instructions and care plan provided today and agrees to view in MyChart. Active MyChart status and patient understanding of how to access instructions and care plan via MyChart confirmed with patient.     Grandville Lax, BSN RN [facility], [facility] RN Care Manager Direct Dial: [phone]  Fax: [phone]  DASH Eating Plan DASH stands for Dietary Approaches to Stop Hypertension. The DASH eating plan is a healthy eating plan that has been shown to: Lower high blood pressure (hypertension). Reduce your risk for type 2 diabetes, heart disease, and stroke. Help with weight loss. What are tips for following this plan? Reading food labels Check food labels for the amount of salt (sodium) per serving. Choose foods with less than 5 percent of the Daily Value (DV) of sodium. In general, foods with less than 300 milligrams (mg) of sodium per serving fit into this eating plan. To find whole grains, look for the word "whole" as the first word in the ingredient list. Shopping Buy products labeled as "low-sodium" or "no salt added." Buy fresh foods. Avoid canned foods and pre-made or frozen meals. Cooking Try not to add salt when you cook. Use salt-free seasonings or herbs instead of table salt or sea salt. Check with your health care provider or pharmacist before using salt substitutes. Do not fry foods. Cook foods in healthy ways, such as baking, boiling, grilling, roasting, or broiling. Cook using oils that are good for your heart. These include  olive, canola, avocado, soybean, and sunflower oil. Meal planning  Eat a balanced diet. This should include: 4 or more servings of fruits and 4 or more servings of vegetables each day. Try to fill half of your plate with fruits and vegetables. 6-8 servings of whole grains each day. 6 or less servings of lean meat, poultry, or fish each day. 1 oz is 1 serving. A 3 oz (85 g) serving of meat is about the same size as the palm of your hand. One egg is 1 oz (28 g). 2-3 servings of low-fat dairy each day. One serving is 1 cup (237 mL). 1 serving of nuts, seeds, or beans 5 times each week. 2-3 servings of heart-healthy fats. Healthy fats called omega-3 fatty acids are found in foods such as walnuts, flaxseeds, fortified milks, and eggs. These fats are also found in cold-water  fish, such as sardines, salmon, and mackerel. Limit how much you eat of: Canned or prepackaged foods. Food that is high in trans fat, such as fried foods. Food that is high in saturated fat, such as fatty meat. Desserts and other sweets, sugary drinks, and other foods with added sugar. Full-fat dairy products. Do not salt foods before eating. Do not eat more than 4 egg yolks a week. Try to eat at least 2 vegetarian meals a week. Eat more home-cooked food and less restaurant, buffet, and fast food. Lifestyle When eating at a restaurant, ask if your food can be  made with less salt or no salt. If you drink alcohol: Limit how much you have to: 0-1 drink a day if you are female. 0-2 drinks a day if you are female. Know how much alcohol is in your drink. In the [place] one drink is one 12 oz bottle of beer (355 mL), one 5 oz glass of wine (148 mL), or one 1 oz glass of hard liquor (44 mL). General information Avoid eating more than 2,300 mg of salt a day. If you have hypertension, you may need to reduce your sodium intake to 1,500 mg a day. Work with your provider to stay at a healthy body weight or lose weight. Ask what the best  weight range is for you. On most days of the week, get at least 30 minutes of exercise that causes your heart to beat faster. This may include walking, swimming, or biking. Work with your provider or dietitian to adjust your eating plan to meet your specific calorie needs. What foods should I eat? Fruits All fresh, dried, or frozen fruit. Canned fruits that are in their natural juice and do not have sugar added to them. Vegetables Fresh or frozen vegetables that are raw, steamed, roasted, or grilled. Low-sodium or reduced-sodium tomato and vegetable juice. Low-sodium or reduced-sodium tomato sauce and tomato paste. Low-sodium or reduced-sodium canned vegetables. Grains Whole-grain or whole-wheat bread. Whole-grain or whole-wheat pasta. Brown rice. Dwyane Glad. Bulgur. Whole-grain and low-sodium cereals. Pita bread. Low-fat, low-sodium crackers. Whole-wheat flour tortillas. Meats and other proteins Skinless chicken or [place]. Ground chicken or [place]. Pork with fat trimmed off. Fish and seafood. Egg whites. Dried beans, peas, or lentils. Unsalted nuts, nut butters, and seeds. Unsalted canned beans. Lean cuts of beef with fat trimmed off. Low-sodium, lean precooked or cured meat, such as sausages or meat loaves. Dairy Low-fat (1%) or fat-free (skim) milk. Reduced-fat, low-fat, or fat-free cheeses. Nonfat, low-sodium ricotta or cottage cheese. Low-fat or nonfat yogurt. Low-fat, low-sodium cheese. Fats and oils Soft margarine without trans fats. Vegetable oil. Reduced-fat, low-fat, or light mayonnaise and salad dressings (reduced-sodium). Canola, safflower, olive, avocado, soybean, and sunflower oils. Avocado. Seasonings and condiments Herbs. Spices. Seasoning mixes without salt. Other foods Unsalted popcorn and pretzels. Fat-free sweets. The items listed above may not be all the foods and drinks you can have. Talk to a dietitian to learn more. What foods should I avoid? Fruits Canned fruit in  a light or heavy syrup. Fried fruit. Fruit in cream or butter sauce. Vegetables Creamed or fried vegetables. Vegetables in a cheese sauce. Regular canned vegetables that are not marked as low-sodium or reduced-sodium. Regular canned tomato sauce and paste that are not marked as low-sodium or reduced-sodium. Regular tomato and vegetable juices that are not marked as low-sodium or reduced-sodium. Vanessa General. Olives. Grains Baked goods made with fat, such as croissants, muffins, or some breads. Dry pasta or rice meal packs. Meats and other proteins Fatty cuts of meat. Ribs. Fried meat. Helene Loader. Bologna, salami, and other precooked or cured meats, such as sausages or meat loaves, that are not lean and low in sodium. Fat from the back of a pig (fatback). Bratwurst. Salted nuts and seeds. Canned beans with added salt. Canned or smoked fish. Whole eggs or egg yolks. Chicken or [place] with skin. Dairy Whole or 2% milk, cream, and half-and-half. Whole or full-fat cream cheese. Whole-fat or sweetened yogurt. Full-fat cheese. Nondairy creamers. Whipped toppings. Processed cheese and cheese spreads. Fats and oils Butter. Stick margarine. Lard. Shortening. Ghee. Helene Loader  fat. Tropical oils, such as coconut, palm kernel, or palm oil. Seasonings and condiments Onion salt, garlic salt, seasoned salt, table salt, and sea salt. Worcestershire sauce. Tartar sauce. Barbecue sauce. Teriyaki sauce. Soy sauce, including reduced-sodium soy sauce. Steak sauce. Canned and packaged gravies. Fish sauce. Oyster sauce. Cocktail sauce. Store-bought horseradish. Ketchup. Mustard. Meat flavorings and tenderizers. Bouillon cubes. Hot sauces. Pre-made or packaged marinades. Pre-made or packaged taco seasonings. Relishes. Regular salad dressings. Other foods Salted popcorn and pretzels. The items listed above may not be all the foods and drinks you should avoid. Talk to a dietitian to learn more. Where to find more information National Heart,  Lung, and Blood Institute (NHLBI): BuffaloDryCleaner.gl American Heart Association (AHA): heart.org Academy of Nutrition and Dietetics: eatright.org National Kidney Foundation (NKF): kidney.org This information is not intended to replace advice given to you by your health care provider. Make sure you discuss any questions you have with your health care provider. Document Revised: 11/18/2022 Document Reviewed: 11/18/2022 Elsevier Patient Education  2024 Elsevier Inc.    Carbohydrate Counting for Diabetes Mellitus, Adult Carbohydrate counting is a method of keeping track of how many carbohydrates you eat. Eating carbohydrates increases the amount of sugar (glucose) in the blood. Counting how many carbohydrates you eat improves how well you manage your blood glucose. This, in turn, helps you manage your diabetes. Carbohydrates are measured in grams (g) per serving. It is important to know how many carbohydrates (in grams or by serving size) you can have in each meal. This is different for every person. A dietitian can help you make a meal plan and calculate how many carbohydrates you should have at each meal and snack. What foods contain carbohydrates? Carbohydrates are found in the following foods: Grains, such as breads and cereals. Dried beans and soy products. Starchy vegetables, such as potatoes, peas, and corn. Fruit and fruit juices. Milk and yogurt. Sweets and snack foods, such as cake, cookies, candy, chips, and soft drinks. How do I count carbohydrates in foods? There are two ways to count carbohydrates in food. You can read food labels or learn standard serving sizes of foods. You can use either of these methods or a combination of both. Using the Nutrition Facts label The Nutrition Facts list is included on the labels of almost all packaged foods and beverages in the [place] . It includes: The serving size. Information about nutrients in each serving, including the grams of  carbohydrate per serving. To use the Nutrition Facts, decide how many servings you will have. Then, multiply the number of servings by the number of carbohydrates per serving. The resulting number is the total grams of carbohydrates that you will be having. Learning the standard serving sizes of foods When you eat carbohydrate foods that are not packaged or do not include Nutrition Facts on the label, you need to measure the servings in order to count the grams of carbohydrates. Measure the foods that you will eat with a food scale or measuring cup, if needed. Decide how many standard-size servings you will eat. Multiply the number of servings by 15. For foods that contain carbohydrates, one serving equals 15 g of carbohydrates. For example, if you eat 2 cups or 10 oz (300 g) of strawberries, you will have eaten 2 servings and 30 g of carbohydrates (2 servings x 15 g = 30 g). For foods that have more than one food mixed, such as soups and casseroles, you must count the carbohydrates in each food that is  included. The following list contains standard serving sizes of common carbohydrate-rich foods. Each of these servings has about 15 g of carbohydrates: 1 slice of bread. 1 six-inch (15 cm) tortilla. ? cup or 2 oz (53 g) cooked rice or pasta.  cup or 3 oz (85 g) cooked or canned, drained and rinsed beans or lentils.  cup or 3 oz (85 g) starchy vegetable, such as peas, corn, or squash.  cup or 4 oz (120 g) hot cereal.  cup or 3 oz (85 g) boiled or mashed potatoes, or  or 3 oz (85 g) of a large baked potato.  cup or 4 fl oz (118 mL) fruit juice. 1 cup or 8 fl oz (237 mL) milk. 1 small or 4 oz (106 g) apple.  or 2 oz (63 g) of a medium banana. 1 cup or 5 oz (150 g) strawberries. 3 cups or 1 oz (28.3 g) popped popcorn. What is an example of carbohydrate counting? To calculate the grams of carbohydrates in this sample meal, follow the steps shown below. Sample meal 3 oz (85 g) chicken  breast. ? cup or 4 oz (106 g) brown rice.  cup or 3 oz (85 g) corn. 1 cup or 8 fl oz (237 mL) milk. 1 cup or 5 oz (150 g) strawberries with sugar-free whipped topping. Carbohydrate calculation Identify the foods that contain carbohydrates: Rice. Corn. Milk. Strawberries. Calculate how many servings you have of each food: 2 servings rice. 1 serving corn. 1 serving milk. 1 serving strawberries. Multiply each number of servings by 15 g: 2 servings rice x 15 g = 30 g. 1 serving corn x 15 g = 15 g. 1 serving milk x 15 g = 15 g. 1 serving strawberries x 15 g = 15 g. Add together all of the amounts to find the total grams of carbohydrates eaten: 30 g + 15 g + 15 g + 15 g = 75 g of carbohydrates total. What are tips for following this plan? Shopping Develop a meal plan and then make a shopping list. Buy fresh and frozen vegetables, fresh and frozen fruit, dairy, eggs, beans, lentils, and whole grains. Look at food labels. Choose foods that have more fiber and less sugar. Avoid processed foods and foods with added sugars. Meal planning Aim to have the same number of grams of carbohydrates at each meal and for each snack time. Plan to have regular, balanced meals and snacks. Where to find more information American Diabetes Association: diabetes.org Centers for Disease Control and Prevention: TonerPromos.no Academy of Nutrition and Dietetics: eatright.org Association of Diabetes Care & Education Specialists: diabeteseducator.org Summary Carbohydrate counting is a method of keeping track of how many carbohydrates you eat. Eating carbohydrates increases the amount of sugar (glucose) in your blood. Counting how many carbohydrates you eat improves how well you manage your blood glucose. This helps you manage your diabetes. A dietitian can help you make a meal plan and calculate how many carbohydrates you should have at each meal and snack. This information is not intended to replace advice given  to you by your health care provider. Make sure you discuss any questions you have with your health care provider. Document Revised: 06/03/2020 Document Reviewed: 06/04/2020 Elsevier Patient Education  2024 [employer].

## 2024-04-27 ENCOUNTER — Other Ambulatory Visit: Payer: Self-pay

## 2024-04-27 ENCOUNTER — Other Ambulatory Visit: Payer: Self-pay | Admitting: *Deleted

## 2024-04-27 NOTE — Patient Instructions (Signed)
 Visit Information  Thank you for taking time to visit with me today. Please don't hesitate to contact me if I can be of assistance to you before our next scheduled appointment.  Your next care management appointment is by telephone on 05/25/2024 at 10:00 am  Telephone follow-up in 1 month  Please call the care guide team at [phone] if you need to cancel, schedule, or reschedule an appointment.   Please call the Suicide and Crisis Lifeline: 988 call the [place]  National Suicide Prevention Lifeline: (581)215-2279 or TTY: 438-880-3359 TTY (437)364-4977) to talk to a trained counselor call 1-800-273-TALK (toll free, 24 hour hotline) call the [facility]: 908-720-2428 call 911 if you are experiencing a Mental Health or Behavioral Health Crisis or need someone to talk to.  Gilberto Labella, MSN, RN [facility], [facility] RN Care Manager Direct Dial: [phone] Fax: [phone]

## 2024-04-27 NOTE — Patient Outreach (Signed)
 Complex Care Management   Visit Note  04/27/2024  Name:  Erica Evans MRN: [id number] DOB: Nov 09, 1951  Situation: Referral received for Complex Care Management related to Diabetes with Complications and HTN I obtained verbal consent from Patient.  Visit completed with Patient  on the phone  Background:   Past Medical History:  Diagnosis Date   Arthritis    Coronary artery disease    COVID-19 2022   Diabetes mellitus without complication (HCC)    GERD (gastroesophageal reflux disease)    Hyperlipidemia    Hypertension    Palpitations    Vertigo     Assessment: Patient Reported Symptoms:  Cognitive Cognitive Status: Alert and oriented to person, place, and time, Insightful and able to interpret abstract concepts      Neurological Neurological Review of Symptoms: Dizziness Neurological Comment: had dizziness, believes related to sinuses - relieved with allegra  HEENT HEENT Symptoms Reported: No symptoms reported HEENT Comment: needs right eye cataract surgery    Cardiovascular Cardiovascular Symptoms Reported: Swelling in legs or feet Does patient have uncontrolled Hypertension?: No Cardiovascular Conditions: High blood cholesterol, Hypertension Cardiovascular Management Strategies: Adequate rest, Medication therapy, Routine screening, Weight management Do You Have a Working Readable Scale?: Yes Weight: 196 lb (88.9 kg)  Respiratory Respiratory Symptoms Reported: No symptoms reported Respiratory Conditions: Seasonal allergies Respiratory Comment: Allegra prn for allergies  Endocrine Patient reports the following symptoms related to hypoglycemia or hyperglycemia : No symptoms reported Is patient diabetic?: Yes Is patient checking blood sugars at home?: Yes Endocrine Conditions: Diabetes, Vitamin D  deficiency Endocrine Management Strategies: Medication therapy, Routine screening, Weight management  Gastrointestinal Gastrointestinal Symptoms Reported: No symptoms  reported Gastrointestinal Conditions: Constipation Gastrointestinal Management Strategies: Nutrition support, Medication therapy Nutrition Risk Screen (CP): No indicators present  Genitourinary Genitourinary Symptoms Reported: No symptoms reported    Integumentary Integumentary Symptoms Reported: No symptoms reported    Musculoskeletal Musculoskelatal Symptoms Reviewed: No symptoms reported, Muscle pain Musculoskeletal Conditions: Back pain Musculoskeletal Management Strategies: Adequate rest, Medication therapy, Routine screening, Weight management Falls in the past year?: No Number of falls in past year: 1 or less Was there an injury with Fall?: No Fall Risk Category Calculator: 0 Patient Fall Risk Level: Low Fall Risk Patient at Risk for Falls Due to: No Fall Risks Fall risk Follow up: Falls evaluation completed  Psychosocial Psychosocial Symptoms Reported: Anxiety - if selected complete GAD Additional Psychological Details: anxiety related to sister's unexected hospitalization Behavioral Health Conditions: Anxiety Behavioral Management Strategies: Adequate rest, Medication therapy Major Change/Loss/Stressor/Fears (CP): Medical condition, family Techniques to Cope with Loss/Stress/Change: Medication, Spiritual practice(s), Diversional activities Quality of Family Relationships: helpful, involved, supportive Do you feel physically threatened by others?: No      04/27/2024   10:47 AM  Depression screen PHQ 2/9  Decreased Interest 0  Down, Depressed, Hopeless 0  PHQ - 2 Score 0    Vitals:   04/27/24 1027  BP: (!) 152/76    Medications Reviewed Today     Reviewed by Gilberto Labella, RN (Registered Nurse) on 04/27/24 at 1033  Med List Status: <None>   Medication Order Taking? Sig Documenting Provider Last Dose Status Informant  amLODipine  (NORVASC ) 10 MG tablet [id number] Yes Take 1 tablet (10 mg total) by mouth daily. Vicky Grange M, DO  Active   ASPIRIN  EC PO  [id number] Yes Take 325 mg by mouth at bedtime. Swallow whole. [provider]  Active Self  blood glucose meter kit and supplies [id number] Yes Check BS once  daily Leoma Raja, MD  Active Self  busPIRone  (BUSPAR ) 15 MG tablet [id number] Yes TAKE 1 TABLET BY MOUTH THREE TIMES A DAY  Patient taking differently: Take 15 mg by mouth daily.   Vicky Grange M, DO  Active   Cholecalciferol (D-3-5) 125 MCG (5000 UT) capsule [id number] Yes Take 5,000 Units by mouth in the morning. [provider]  Active Self  cyclobenzaprine  (FLEXERIL ) 5 MG tablet [id number] Yes Take 1 tablet (5 mg total) by mouth at bedtime as needed for muscle spasms. Vicky Grange M, DO  Active   glipiZIDE  (GLUCOTROL  XL) 10 MG 24 hr tablet [id number] Yes Take 1 tablet (10 mg total) by mouth daily with breakfast. Vicky Grange M, DO  Active   glucose blood [facility] VERIO) test strip [id number] Yes TEST BLOOD SUGAR ONCE DAILY DX E11.9 Vicky Grange M, DO  Active   hydrochlorothiazide  (HYDRODIURIL ) 25 MG tablet [id number] Yes Take 1 tablet (25 mg total) by mouth daily. Vicky Grange M, DO  Active   lidocaine  (LIDODERM ) 5 % [id number] Yes PLACE 1 PATCH ONTO THE SKIN DAILY. REMOVE & DISCARD PATCH WITHIN 12 HOURS OR AS DIRECTED BY MD  Patient taking differently: Place 1 patch onto the skin as needed. Remove & Discard patch within 12 hours or as directed by MD   Eliodoro Guerin, DO  Active   metFORMIN  (GLUCOPHAGE -XR) 500 MG 24 hr tablet [id number] Yes Take 2 tablets (1,000 mg total) by mouth daily with supper. Vicky Grange M, DO  Active   oxyCODONE -acetaminophen  (PERCOCET) 5-325 MG tablet [id number]  Take 1 tablet by mouth every 6 (six) hours as needed for severe pain.  Patient not taking: Reported on 04/27/2024   Bonnye Butts, PA-C  Consider Medication Status and Discontinue   pravastatin  (PRAVACHOL ) 40 MG tablet [id number] Yes Take 1 tablet (40 mg total) by mouth daily. Vicky Grange M, DO   Active   Semaglutide , 2 MG/DOSE, 8 MG/3ML SOPN [id number] Yes Inject 2 mg as directed once a week. Patient assistance program Eliodoro Guerin, [place]  Active             Recommendation:   PCP Follow-up Continue Current Plan of Care  Follow Up Plan:   Telephone follow-up in 1 month  Gilberto Labella, MSN, RN [facility]  [facility], [facility] RN Care Manager Direct Dial: [phone] Fax: [phone]

## 2024-05-07 ENCOUNTER — Ambulatory Visit (INDEPENDENT_AMBULATORY_CARE_PROVIDER_SITE_OTHER): Admitting: Family Medicine

## 2024-05-07 ENCOUNTER — Encounter: Payer: Self-pay | Admitting: Family Medicine

## 2024-05-07 VITALS — BP 138/80 | HR 93 | Temp 97.9°F | Ht 67.0 in | Wt 199.0 lb

## 2024-05-07 DIAGNOSIS — Z7985 Long-term (current) use of injectable non-insulin antidiabetic drugs: Secondary | ICD-10-CM

## 2024-05-07 DIAGNOSIS — E119 Type 2 diabetes mellitus without complications: Secondary | ICD-10-CM

## 2024-05-07 DIAGNOSIS — E1169 Type 2 diabetes mellitus with other specified complication: Secondary | ICD-10-CM | POA: Diagnosis not present

## 2024-05-07 DIAGNOSIS — E785 Hyperlipidemia, unspecified: Secondary | ICD-10-CM

## 2024-05-07 DIAGNOSIS — E871 Hypo-osmolality and hyponatremia: Secondary | ICD-10-CM

## 2024-05-07 DIAGNOSIS — I152 Hypertension secondary to endocrine disorders: Secondary | ICD-10-CM

## 2024-05-07 DIAGNOSIS — E1159 Type 2 diabetes mellitus with other circulatory complications: Secondary | ICD-10-CM | POA: Diagnosis not present

## 2024-05-07 LAB — BASIC METABOLIC PANEL WITH GFR
BUN/Creatinine Ratio: 12 (ref 12–28)
BUN: 11 mg/dL (ref 8–27)
CO2: 21 mmol/L (ref 20–29)
Calcium: 10.1 mg/dL (ref 8.7–10.3)
Chloride: 91 mmol/L — ABNORMAL LOW (ref 96–106)
Creatinine, Ser: 0.92 mg/dL (ref 0.57–1.00)
Glucose: 121 mg/dL — ABNORMAL HIGH (ref 70–99)
Potassium: 4.9 mmol/L (ref 3.5–5.2)
Sodium: 134 mmol/L (ref 134–144)
eGFR: 67 mL/min/{1.73_m2} (ref >59)

## 2024-05-07 LAB — BAYER DCA HB A1C WAIVED: HB A1C (BAYER DCA - WAIVED): 6.9 % — ABNORMAL HIGH (ref 4.8–5.6)

## 2024-05-07 NOTE — Progress Notes (Signed)
 Subjective: CC:DM PCP: Erica Erica HERO, DO YEP:Inmpd [name] is a 73 y.o. female presenting to clinic today for:  1. Type 2 Diabetes with hypertension, hyperlipidemia:  Semaglutide  inj increased to 2mg  last OV.  Gets through PAP. BGs have been all doing really well.  The highest she seen was 150 and she took an extra metformin  that day.  Otherwise it has been running 110s to 120s.  No hypoglycemic episodes.  She does have some constipation but that is relieved by consumption of increased fiber/fruits.  She reports no nausea or vomiting or belly pain.  She is lost a couple of pounds since her last visit and she is thrilled about this.  She continues to take care of her husband, whose health is declining.  Diabetes Health Maintenance Due  Topic Date Due   OPHTHALMOLOGY EXAM  06/19/2024   HEMOGLOBIN A1C  08/05/2024   FOOT EXAM  02/02/2025    Last A1c:  Lab Results  Component Value Date   HGBA1C 7.0 (H) 02/03/2024    ROS: Per HPI  Allergies  Allergen Reactions   Jardiance  [Empagliflozin ]     Yeast infections   Lipitor [Atorvastatin]     Myopathy/fibromyalgia    Morphine And Codeine    Pioglitazone      Ankle swelling   Farxiga  [Dapagliflozin ]     UTI/CONSTIPATION   Past Medical History:  Diagnosis Date   Arthritis    Coronary artery disease    COVID-19 2022   Diabetes mellitus without complication (HCC)    GERD (gastroesophageal reflux disease)    Hyperlipidemia    Hypertension    Palpitations    Vertigo     Current Outpatient Medications:    amLODipine  (NORVASC ) 10 MG tablet, Take 1 tablet (10 mg total) by mouth daily., Disp: 90 tablet, Rfl: 4   ASPIRIN  EC PO, Take 325 mg by mouth at bedtime. Swallow whole., Disp: , Rfl:    blood glucose meter kit and supplies, Check BS once daily, Disp: 1 each, Rfl: 0   busPIRone  (BUSPAR ) 15 MG tablet, TAKE 1 TABLET BY MOUTH THREE TIMES A DAY (Patient taking differently: Take 15 mg by mouth daily.), Disp: 270 tablet,  Rfl: 0   Cholecalciferol (D-3-5) 125 MCG (5000 UT) capsule, Take 5,000 Units by mouth in the morning., Disp: , Rfl:    cyclobenzaprine  (FLEXERIL ) 5 MG tablet, Take 1 tablet (5 mg total) by mouth at bedtime as needed for muscle spasms., Disp: 90 tablet, Rfl: 3   fexofenadine (ALLEGRA) 180 MG tablet, Take 180 mg by mouth as needed for allergies or rhinitis (sinus and allergies)., Disp: , Rfl:    glipiZIDE  (GLUCOTROL  XL) 10 MG 24 hr tablet, Take 1 tablet (10 mg total) by mouth daily with breakfast., Disp: 90 tablet, Rfl: 4   glucose blood (ONETOUCH VERIO) test strip, TEST BLOOD SUGAR ONCE DAILY DX E11.9, Disp: 100 strip, Rfl: 3   hydrochlorothiazide  (HYDRODIURIL ) 25 MG tablet, Take 1 tablet (25 mg total) by mouth daily., Disp: 90 tablet, Rfl: 4   lidocaine  (LIDODERM ) 5 %, PLACE 1 PATCH ONTO THE SKIN DAILY. REMOVE & DISCARD PATCH WITHIN 12 HOURS OR AS DIRECTED BY MD (Patient taking differently: Place 1 patch onto the skin as needed. Remove & Discard patch within 12 hours or as directed by MD), Disp: 30 patch, Rfl: PRN   metFORMIN  (GLUCOPHAGE -XR) 500 MG 24 hr tablet, Take 2 tablets (1,000 mg total) by mouth daily with supper., Disp: 180 tablet, Rfl: 4  oxyCODONE -acetaminophen  (PERCOCET) 5-325 MG tablet, Take 1 tablet by mouth every 6 (six) hours as needed for severe pain., Disp: 8 tablet, Rfl: 0   pravastatin  (PRAVACHOL ) 40 MG tablet, Take 1 tablet (40 mg total) by mouth daily., Disp: 90 tablet, Rfl: 4   Semaglutide , 2 MG/DOSE, 8 MG/3ML SOPN, Inject 2 mg as directed once a week. Patient assistance program, Disp: , Rfl:  Social History   Socioeconomic History   Marital status: Married    Spouse name: [name]   Number of children: 2   Years of education: Not on file   Highest education level: Associate degree: academic program  Occupational History   Occupation: Geologist, engineering    Comment: retired  Tobacco Use   Smoking status: Former    Current packs/day: 0.00    Types: Cigarettes    Quit date:  1990    Years since quitting: 35.4   Smokeless tobacco: Never  Vaping Use   Vaping status: Never Used  Substance and Sexual Activity   Alcohol use: No   Drug use: No   Sexual activity: Not on file  Other Topics Concern   Not on file  Social History Narrative   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 She lives at home with her husband [name] and her grown son. She has 3 children. She enjoys bird watching and walking.    Social Drivers of Health   Financial Resource Strain: Medium Risk (04/13/2024)   Overall Financial Resource Strain (CARDIA)    Difficulty of Paying Living Expenses: Somewhat hard  Food Insecurity: No Food Insecurity (04/27/2024)   Hunger Vital Sign    Worried About Running Out of Food in the Last Year: Never true    Ran Out of Food in the Last Year: Never true  Transportation Needs: No Transportation Needs (04/27/2024)   PRAPARE - Administrator, Civil Service (Medical): No    Lack of Transportation (Non-Medical): No  Physical Activity: Sufficiently Active (04/13/2024)   Exercise Vital Sign    Days of Exercise per Week: 5 days    Minutes of Exercise per Session: 30 min  Stress: No Stress Concern Present (04/13/2024)   [employer] of Occupational Health - Occupational Stress Questionnaire    Feeling of Stress : Only a little  Social Connections: Moderately Integrated (04/13/2024)   Social Connection and Isolation Panel    Frequency of Communication with Friends and Family: More than three times a week    Frequency of Social Gatherings with Friends and Family: Once a week    Attends Religious Services: More than 4 times per year    Active Member of [employer] or Organizations: No    Attends Banker Meetings: Never    Marital Status: Married  Catering manager Violence: Not At Risk (04/27/2024)   Humiliation, Afraid, Rape, and Kick questionnaire    Fear of Current or Ex-Partner: No    Emotionally Abused: No    Physically Abused: No    Sexually  Abused: No   Family History  Problem Relation Age of Onset   Coronary artery disease Mother    Diabetes Mother    Heart disease Mother    Coronary artery disease Father    Heart disease Father    Diabetes Sister    Hypertension Sister    Heart disease Sister    Diabetes Brother    Diabetes Sister    Arthritis Sister    Hypertension Sister    Diabetes Sister  Hypertension Sister    Hypertension Sister     Objective: Office vital signs reviewed. BP 138/80   Pulse 93   Temp 97.9 F (36.6 C)   Ht 5' 7 (1.702 m)   Wt 199 lb (90.3 kg)   SpO2 96%   BMI 31.17 kg/m   Physical Examination:  General: Awake, alert, well nourished, No acute distress HEENT: sclera white, MMM Cardio: regular rate and rhythm, S1S2 heard, no murmurs appreciated Pulm: clear to auscultation bilaterally, no wheezes, rhonchi or rales; normal work of breathing on room air  Assessment/ Plan: 73 y.o. female   Diabetes mellitus treated with injections of non-insulin  medication (HCC) - Plan: Bayer DCA Hb A1c Waived  Hyperlipidemia associated with type 2 diabetes mellitus (HCC)  Hypertension associated with diabetes (HCC) - Plan: Basic Metabolic Panel  Hyponatremia - Plan: Basic Metabolic Panel  A1c down to 6.9.  Technically at goal now.  Continue Ozempic  as prescribed.  Not yet due for fasting lipid.  Continue statin  Blood pressure is controlled upon recheck.  Check renal function given hyponatremia on most recent lab draw  Erica CHRISTELLA Fielding, DO [facility] [phone]

## 2024-05-08 ENCOUNTER — Ambulatory Visit: Payer: Self-pay | Admitting: Family Medicine

## 2024-05-25 ENCOUNTER — Telehealth: Payer: Self-pay | Admitting: *Deleted

## 2024-05-25 ENCOUNTER — Encounter: Payer: Self-pay | Admitting: *Deleted

## 2024-06-07 ENCOUNTER — Telehealth: Payer: Self-pay

## 2024-06-07 NOTE — Telephone Encounter (Signed)
 [facility] [facility] refill form for Ozempic  2mg  pens emailed to [facility] for completion.

## 2024-06-12 NOTE — Telephone Encounter (Signed)
 Faxed completed refill form to [facility] [facility]

## 2024-08-15 ENCOUNTER — Ambulatory Visit (INDEPENDENT_AMBULATORY_CARE_PROVIDER_SITE_OTHER): Admitting: *Deleted

## 2024-08-15 DIAGNOSIS — Z23 Encounter for immunization: Secondary | ICD-10-CM

## 2024-08-28 ENCOUNTER — Other Ambulatory Visit: Payer: Self-pay | Admitting: Family Medicine

## 2024-09-11 LAB — LAB REPORT - SCANNED

## 2024-09-19 ENCOUNTER — Ambulatory Visit (INDEPENDENT_AMBULATORY_CARE_PROVIDER_SITE_OTHER): Admitting: Family Medicine

## 2024-10-01 ENCOUNTER — Other Ambulatory Visit: Payer: Self-pay | Admitting: Vascular Surgery

## 2024-10-01 DIAGNOSIS — I6523 Occlusion and stenosis of bilateral carotid arteries: Secondary | ICD-10-CM

## 2024-10-08 ENCOUNTER — Other Ambulatory Visit: Payer: Self-pay | Admitting: *Deleted

## 2024-10-08 NOTE — Patient Outreach (Signed)
 Complex Care Management   Visit Note  10/08/2024  Name:  Erica Evans MRN: [id number] DOB: 1951/10/26  Situation: Referral received for Complex Care Management related to Diabetes with Complications and HTN I obtained verbal consent from Patient.  Visit completed with Patient  on the phone  Background:   Past Medical History:  Diagnosis Date   Arthritis    Coronary artery disease    COVID-19 2022   Diabetes mellitus without complication (HCC)    GERD (gastroesophageal reflux disease)    Hyperlipidemia    Hypertension    Palpitations    Vertigo     Assessment: Patient Reported Symptoms:  Cognitive Cognitive Status: No symptoms reported Cognitive/Intellectual Conditions Management [RPT]: None reported or documented in medical history or problem list   Health Maintenance Behaviors: Annual physical exam Healing Pattern: Average Health Facilitated by: Rest  Neurological Neurological Review of Symptoms: No symptoms reported Neurological Management Strategies: Routine screening Neurological Self-Management Outcome: 4 (good)  HEENT HEENT Symptoms Reported: No symptoms reported HEENT Management Strategies: Routine screening HEENT Self-Management Outcome: 4 (good)    Cardiovascular Cardiovascular Symptoms Reported: No symptoms reported Does patient have uncontrolled Hypertension?: Yes Is patient checking Blood Pressure at home?: Yes Cardiovascular Management Strategies: Routine screening Cardiovascular Self-Management Outcome: 4 (good)  Respiratory   Respiratory Management Strategies: Routine screening Respiratory Self-Management Outcome: 4 (good)  Endocrine Endocrine Symptoms Reported: No symptoms reported Is patient diabetic?: Yes Is patient checking blood sugars at home?: Yes List most recent blood sugar readings, include date and time of day: 10-08-2024 139    Gastrointestinal Gastrointestinal Symptoms Reported: Constipation Gastrointestinal Management  Strategies: Medication therapy Gastrointestinal Self-Management Outcome: 4 (good) Nutrition Risk Screen (CP): No indicators present  Genitourinary Genitourinary Symptoms Reported: No symptoms reported Genitourinary Self-Management Outcome: 4 (good)  Integumentary Additional Integumentary Details: bruises easily Skin Self-Management Outcome: 4 (good)  Musculoskeletal Musculoskelatal Symptoms Reviewed: Back pain Musculoskeletal Management Strategies: Routine screening Musculoskeletal Self-Management Outcome: 4 (good) Falls in the past year?: No Number of falls in past year: 1 or less Was there an injury with Fall?: No Fall Risk Category Calculator: 0 Patient Fall Risk Level: Low Fall Risk Patient at Risk for Falls Due to: No Fall Risks Fall risk Follow up: Falls evaluation completed  Psychosocial Psychosocial Symptoms Reported: No symptoms reported Behavioral Management Strategies: Coping strategies Behavioral Health Self-Management Outcome: 4 (good) Major Change/Loss/Stressor/Fears (CP): Medical condition, self Techniques to Cope with Loss/Stress/Change: Diversional activities Quality of Family Relationships: involved, helpful, supportive Do you feel physically threatened by others?: No    10/08/2024    PHQ2-9 Depression Screening   Little interest or pleasure in doing things Not at all  Feeling down, depressed, or hopeless Not at all  PHQ-2 - Total Score 0  Trouble falling or staying asleep, or sleeping too much    Feeling tired or having little energy    Poor appetite or overeating     Feeling bad about yourself - or that you are a failure or have let yourself or your family down    Trouble concentrating on things, such as reading the newspaper or watching television    Moving or speaking so slowly that other people could have noticed.  Or the opposite - being so fidgety or restless that you have been moving around a lot more than usual    Thoughts that you would be better off  dead, or hurting yourself in some way    PHQ2-9 Total Score    If you checked off  any problems, how difficult have these problems made it for you to do your work, take care of things at home, or get along with other people    Depression Interventions/Treatment      Today's Vitals   10/08/24 1508  BP: (!) 141/68   Pain Scale: 0-10 Pain Score: 2  Pain Type: Chronic pain Pain Location: Back Pain Intervention(s): Medication (See eMAR), Relaxation  Medications Reviewed Today     Reviewed by Bertrum Rosina HERO, RN (Registered Nurse) on 10/08/24 at 1500  Med List Status: <None>   Medication Order Taking? Sig Documenting Provider Last Dose Status Informant  amLODipine  (NORVASC ) 10 MG tablet [id number] Yes Take 1 tablet (10 mg total) by mouth daily. Jolinda Norene HERO, DO  Active   ASPIRIN  EC PO [id number] Yes Take 325 mg by mouth at bedtime. Swallow whole. [provider]  Active Self  blood glucose meter kit and supplies [id number] Yes Check BS once daily Harl Jayson CROME, MD  Active Self  busPIRone  (BUSPAR ) 15 MG tablet [id number] Yes TAKE 1 TABLET BY MOUTH THREE TIMES A DAY Jolinda Norene M, DO  Active   Cholecalciferol (D-3-5) 125 MCG (5000 UT) capsule [id number] Yes Take 5,000 Units by mouth in the morning. [provider]  Active Self  cyclobenzaprine  (FLEXERIL ) 5 MG tablet [id number] Yes TAKE 1 TABLET BY MOUTH AT BEDTIME AS NEEDED FOR MUSCLE SPASMS. Jolinda Norene M, DO  Active   fexofenadine (ALLEGRA) 180 MG tablet [id number] Yes Take 180 mg by mouth as needed for allergies or rhinitis (sinus and allergies). [provider]  Active   glipiZIDE  (GLUCOTROL  XL) 10 MG 24 hr tablet [id number] Yes Take 1 tablet (10 mg total) by mouth daily with breakfast. Jolinda Norene M, DO  Active   glucose blood [facility] VERIO) test strip [id number] Yes TEST BLOOD SUGAR ONCE DAILY DX E11.9 Jolinda Norene M, DO  Active   hydrochlorothiazide  (HYDRODIURIL ) 25 MG tablet  [id number] Yes Take 1 tablet (25 mg total) by mouth daily. Jolinda Norene M, DO  Active   lidocaine  (LIDODERM ) 5 % [id number] Yes PLACE 1 PATCH ONTO THE SKIN DAILY. REMOVE & DISCARD PATCH WITHIN 12 HOURS OR AS DIRECTED BY MD  Patient taking differently: Place 1 patch onto the skin as needed. Remove & Discard patch within 12 hours or as directed by MD   Jolinda Norene HERO, DO  Active   metFORMIN  (GLUCOPHAGE -XR) 500 MG 24 hr tablet [id number] Yes Take 2 tablets (1,000 mg total) by mouth daily with supper. Jolinda Norene M, DO  Active   pravastatin  (PRAVACHOL ) 40 MG tablet [id number] Yes Take 1 tablet (40 mg total) by mouth daily. Jolinda Norene HERO, DO  Active   Semaglutide , 2 MG/DOSE, 8 MG/3ML SOPN [id number] Yes Inject 2 mg as directed once a week. Patient assistance program Jolinda Norene HERO, [place]  Active             Recommendation:   Continue Current Plan of Care  Follow Up Plan:   Telephone follow-up in 1 month  Rosina Bertrum, BSN RN [facility], [facility] RN Care Manager Direct Dial: [phone]  Fax: [phone]

## 2024-10-08 NOTE — Patient Instructions (Signed)
 Visit Information  Thank you for taking time to visit with me today. Please don't hesitate to contact me if I can be of assistance to you before our next scheduled appointment.  Your next care management appointment is by telephone on 11-05-2024 at 1:30 pm  Telephone follow-up in 1 month  Please call the care guide team at [phone] if you need to cancel, schedule, or reschedule an appointment.   Please call the Suicide and Crisis Lifeline: 988 call the [place]  National Suicide Prevention Lifeline: 367-022-0820 or TTY: 250-819-2792 TTY 812-887-5022) to talk to a trained counselor call 1-800-273-TALK (toll free, 24 hour hotline) if you are experiencing a Mental Health or Behavioral Health Crisis or need someone to talk to.  Rosina Forte, BSN RN [facility], [facility] RN Care Manager Direct Dial: [phone]  Fax: [phone]

## 2024-11-05 ENCOUNTER — Telehealth: Payer: Self-pay | Admitting: *Deleted

## 2024-11-05 ENCOUNTER — Encounter: Payer: Self-pay | Admitting: *Deleted

## 2024-11-05 NOTE — Patient Instructions (Signed)
 Romelia C Gusler Minter - I am sorry I was unable to reach you today for our scheduled appointment. I work with Jolinda Norene HERO, DO and am calling to support your healthcare needs. Please contact me at [phone] at your earliest convenience. I look forward to speaking with you soon.   Thank you,  Rosina Forte, BSN RN [facility], [facility] RN Care Manager Direct Dial: [phone]  Fax: [phone]

## 2024-11-16 ENCOUNTER — Other Ambulatory Visit: Payer: Self-pay | Admitting: *Deleted

## 2024-11-16 NOTE — Patient Outreach (Signed)
 Complex Care Management   Visit Note  11/16/2024  Name:  Erica Evans MRN: [id number] DOB: 05-Aug-1951  Situation: Referral received for Complex Care Management related to Diabetes with Complications and HTN I obtained verbal consent from Patient.  Visit completed with Patient  on the phone  Background:   Past Medical History:  Diagnosis Date   Arthritis    Coronary artery disease    COVID-19 2022   Diabetes mellitus without complication (HCC)    GERD (gastroesophageal reflux disease)    Hyperlipidemia    Hypertension    Palpitations    Vertigo     Assessment: Patient Reported Symptoms:  Cognitive Cognitive Status: No symptoms reported Cognitive/Intellectual Conditions Management [RPT]: None reported or documented in medical history or problem list   Health Maintenance Behaviors: Annual physical exam Healing Pattern: Average Health Facilitated by: Rest  Neurological Neurological Review of Symptoms: No symptoms reported Neurological Management Strategies: Routine screening Neurological Self-Management Outcome: 5 (very good)  HEENT HEENT Symptoms Reported: No symptoms reported HEENT Management Strategies: Routine screening HEENT Self-Management Outcome: 5 (very good)    Cardiovascular Cardiovascular Symptoms Reported: No symptoms reported Does patient have uncontrolled Hypertension?: Yes Is patient checking Blood Pressure at home?: Yes Patient's Recent BP reading at home: 140/73 (patient reported) Cardiovascular Management Strategies: Routine screening, Medication therapy Cardiovascular Self-Management Outcome: 4 (good)  Respiratory Respiratory Symptoms Reported: No symptoms reported Respiratory Management Strategies: Routine screening Respiratory Self-Management Outcome: 4 (good)  Endocrine Endocrine Symptoms Reported: No symptoms reported Is patient diabetic?: Yes Is patient checking blood sugars at home?: Yes List most recent blood sugar readings, include  date and time of day: 11-16-24 110 Endocrine Self-Management Outcome: 4 (good)  Gastrointestinal Gastrointestinal Symptoms Reported: No symptoms reported Gastrointestinal Management Strategies: Coping strategies Gastrointestinal Self-Management Outcome: 4 (good)    Genitourinary Genitourinary Symptoms Reported: No symptoms reported Genitourinary Self-Management Outcome: 4 (good)  Integumentary Integumentary Symptoms Reported: No symptoms reported Skin Management Strategies: Routine screening Skin Self-Management Outcome: 5 (very good)  Musculoskeletal Musculoskelatal Symptoms Reviewed: Back pain Musculoskeletal Management Strategies: Coping strategies, Medication therapy, Routine screening Musculoskeletal Self-Management Outcome: 4 (good) Falls in the past year?: No Number of falls in past year: 1 or less Was there an injury with Fall?: No Fall Risk Category Calculator: 0 Patient Fall Risk Level: Low Fall Risk Patient at Risk for Falls Due to: No Fall Risks Fall risk Follow up: Falls evaluation completed  Psychosocial Psychosocial Symptoms Reported: Anxiety - if selected complete GAD Behavioral Management Strategies: Coping strategies Behavioral Health Self-Management Outcome: 4 (good) Major Change/Loss/Stressor/Fears (CP): Medical condition, self Techniques to Cope with Loss/Stress/Change: Diversional activities      11/16/2024    PHQ2-9 Depression Screening   Little interest or pleasure in doing things Not at all  Feeling down, depressed, or hopeless Not at all  PHQ-2 - Total Score 0  Trouble falling or staying asleep, or sleeping too much    Feeling tired or having little energy    Poor appetite or overeating     Feeling bad about yourself - or that you are a failure or have let yourself or your family down    Trouble concentrating on things, such as reading the newspaper or watching television    Moving or speaking so slowly that other people could have noticed.  Or the  opposite - being so fidgety or restless that you have been moving around a lot more than usual    Thoughts that you would be better off dead, or  hurting yourself in some way    PHQ2-9 Total Score    If you checked off any problems, how difficult have these problems made it for you to do your work, take care of things at home, or get along with other people    Depression Interventions/Treatment      Today's Vitals   11/16/24 0914  BP: (!) 140/73   Pain Scale: 0-10 Pain Score: 3  Pain Type: Chronic pain Pain Location: Back Pain Intervention(s): Medication (See eMAR)  Medications Reviewed Today     Reviewed by Bertrum Rosina HERO, RN (Registered Nurse) on 11/16/24 at [phone]  Med List Status: <None>   Medication Order Taking? Sig Documenting Provider Last Dose Status Informant  amLODipine  (NORVASC ) 10 MG tablet [id number]  Take 1 tablet (10 mg total) by mouth daily. Jolinda Potter M, DO  Active   ASPIRIN  EC PO [id number]  Take 325 mg by mouth at bedtime. Swallow whole. [provider]  Active Self  blood glucose meter kit and supplies [id number]  Check BS once daily Harl Jayson CROME, MD  Active Self  busPIRone  (BUSPAR ) 15 MG tablet [id number]  TAKE 1 TABLET BY MOUTH THREE TIMES A DAY Jolinda Potter M, DO  Active   Cholecalciferol (D-3-5) 125 MCG (5000 UT) capsule [id number]  Take 5,000 Units by mouth in the morning. [provider]  Active Self  cyclobenzaprine  (FLEXERIL ) 5 MG tablet [id number]  TAKE 1 TABLET BY MOUTH AT BEDTIME AS NEEDED FOR MUSCLE SPASMS. Jolinda Potter M, DO  Active   fexofenadine (ALLEGRA) 180 MG tablet [id number]  Take 180 mg by mouth as needed for allergies or rhinitis (sinus and allergies). [provider]  Active   glipiZIDE  (GLUCOTROL  XL) 10 MG 24 hr tablet [id number]  Take 1 tablet (10 mg total) by mouth daily with breakfast. Jolinda Potter M, DO  Active   glucose blood [facility] VERIO) test strip [id number]  TEST BLOOD SUGAR ONCE DAILY  DX E11.9 Jolinda Potter M, DO  Active   hydrochlorothiazide  (HYDRODIURIL ) 25 MG tablet [id number]  Take 1 tablet (25 mg total) by mouth daily. Jolinda Potter M, DO  Active   lidocaine  (LIDODERM ) 5 % [id number]  PLACE 1 PATCH ONTO THE SKIN DAILY. REMOVE & DISCARD PATCH WITHIN 12 HOURS OR AS DIRECTED BY MD  Patient taking differently: Place 1 patch onto the skin as needed. Remove & Discard patch within 12 hours or as directed by MD   Jolinda Potter HERO, DO  Active   metFORMIN  (GLUCOPHAGE -XR) 500 MG 24 hr tablet [id number]  Take 2 tablets (1,000 mg total) by mouth daily with supper. Jolinda Potter M, DO  Active   pravastatin  (PRAVACHOL ) 40 MG tablet [id number]  Take 1 tablet (40 mg total) by mouth daily. Jolinda Potter M, DO  Active   Semaglutide , 2 MG/DOSE, 8 MG/3ML SOPN [id number]  Inject 2 mg as directed once a week. Patient assistance program Jolinda Potter HERO, [place]  Active             Recommendation:   Continue Current Plan of Care  Follow Up Plan:   Telephone follow-up in 1 month  Rosina Bertrum, BSN RN [facility], [facility] RN Care Manager Direct Dial: [phone]  Fax: [phone]

## 2024-11-16 NOTE — Patient Instructions (Signed)
 Visit Information  Thank you for taking time to visit with me today. Please don't hesitate to contact me if I can be of assistance to you before our next scheduled appointment.  Your next care management appointment is by telephone on 12-17-2024 at 9:00 am  Telephone follow-up in 1 month  Please call the care guide team at [phone] if you need to cancel, schedule, or reschedule an appointment.   Please call the Suicide and Crisis Lifeline: 988 call the [place]  National Suicide Prevention Lifeline: 403-022-4961 or TTY: (920)581-8106 TTY (661)801-8590) to talk to a trained counselor call 1-800-273-TALK (toll free, 24 hour hotline) if you are experiencing a Mental Health or Behavioral Health Crisis or need someone to talk to.  Rosina Forte, BSN RN [facility], [facility] RN Care Manager Direct Dial: [phone]  Fax: [phone]

## 2024-11-23 ENCOUNTER — Other Ambulatory Visit: Payer: Self-pay | Admitting: *Deleted

## 2024-11-23 DIAGNOSIS — I6523 Occlusion and stenosis of bilateral carotid arteries: Secondary | ICD-10-CM

## 2024-11-23 DIAGNOSIS — Z9889 Other specified postprocedural states: Secondary | ICD-10-CM

## 2024-11-26 ENCOUNTER — Encounter: Payer: Self-pay | Admitting: Family Medicine

## 2024-11-26 ENCOUNTER — Ambulatory Visit (INDEPENDENT_AMBULATORY_CARE_PROVIDER_SITE_OTHER): Payer: Self-pay | Admitting: Family Medicine

## 2024-11-26 ENCOUNTER — Ambulatory Visit (INDEPENDENT_AMBULATORY_CARE_PROVIDER_SITE_OTHER)

## 2024-11-26 ENCOUNTER — Ambulatory Visit: Payer: Self-pay | Admitting: Family Medicine

## 2024-11-26 VITALS — BP 125/79 | HR 89 | Temp 97.5°F | Ht 67.0 in | Wt 195.4 lb

## 2024-11-26 DIAGNOSIS — E1159 Type 2 diabetes mellitus with other circulatory complications: Secondary | ICD-10-CM

## 2024-11-26 DIAGNOSIS — E785 Hyperlipidemia, unspecified: Secondary | ICD-10-CM | POA: Diagnosis not present

## 2024-11-26 DIAGNOSIS — F411 Generalized anxiety disorder: Secondary | ICD-10-CM

## 2024-11-26 DIAGNOSIS — E1169 Type 2 diabetes mellitus with other specified complication: Secondary | ICD-10-CM | POA: Diagnosis not present

## 2024-11-26 DIAGNOSIS — E119 Type 2 diabetes mellitus without complications: Secondary | ICD-10-CM

## 2024-11-26 DIAGNOSIS — Z2882 Immunization not carried out because of caregiver refusal: Secondary | ICD-10-CM | POA: Diagnosis not present

## 2024-11-26 DIAGNOSIS — Z7985 Long-term (current) use of injectable non-insulin antidiabetic drugs: Secondary | ICD-10-CM

## 2024-11-26 DIAGNOSIS — Z0000 Encounter for general adult medical examination without abnormal findings: Secondary | ICD-10-CM | POA: Diagnosis not present

## 2024-11-26 DIAGNOSIS — M810 Age-related osteoporosis without current pathological fracture: Secondary | ICD-10-CM | POA: Diagnosis not present

## 2024-11-26 DIAGNOSIS — I152 Hypertension secondary to endocrine disorders: Secondary | ICD-10-CM

## 2024-11-26 LAB — BAYER DCA HB A1C WAIVED: HB A1C (BAYER DCA - WAIVED): 6.5 % — ABNORMAL HIGH (ref 4.8–5.6)

## 2024-11-26 MED ORDER — BUSPIRONE HCL 15 MG PO TABS: 15.0000 mg | ORAL_TABLET | Freq: Three times a day (TID) | ORAL | 3 refills | Status: AC

## 2024-11-26 MED ORDER — CYCLOBENZAPRINE HCL 5 MG PO TABS: 5.0000 mg | ORAL_TABLET | Freq: Every evening | ORAL | 3 refills | Status: AC | PRN

## 2024-11-26 MED ORDER — METFORMIN HCL ER 500 MG PO TB24: 1000.0000 mg | ORAL_TABLET | Freq: Every day | ORAL | 4 refills | Status: AC

## 2024-11-26 MED ORDER — SEMAGLUTIDE (2 MG/DOSE) 8 MG/3ML ~~LOC~~ SOPN: 2.0000 mg | PEN_INJECTOR | SUBCUTANEOUS | 3 refills | Status: AC

## 2024-11-26 MED ORDER — GLIPIZIDE ER 10 MG PO TB24: 10.0000 mg | ORAL_TABLET | Freq: Every day | ORAL | 4 refills | Status: AC

## 2024-11-26 MED ORDER — HYDROCHLOROTHIAZIDE 25 MG PO TABS: 25.0000 mg | ORAL_TABLET | Freq: Every day | ORAL | 4 refills | Status: AC

## 2024-11-26 MED ORDER — AMLODIPINE BESYLATE 10 MG PO TABS: 10.0000 mg | ORAL_TABLET | Freq: Every day | ORAL | 4 refills | Status: AC

## 2024-11-26 MED ORDER — PRAVASTATIN SODIUM 40 MG PO TABS: 40.0000 mg | ORAL_TABLET | Freq: Every day | ORAL | 4 refills | Status: AC

## 2024-11-26 NOTE — Patient Instructions (Signed)
 Get mammogram scheduled at [facility].  You are overdue for this

## 2024-11-26 NOTE — Progress Notes (Signed)
 "  Erica Evans is a 74 y.o. female presents to office today for annual physical exam examination.    Type 2 Diabetes with hypertension, hyperlipidemia:  Compliant with medications but notes that she is almost out of her semaglutide , which she was getting from a patient assistance program.  She has not yet checked with her new insurance company to see if this is something that is on the formulary.  She reports compliance with all medications as prescribed and reports no hypoglycemic episodes but does note that she has had some increased blood sugars over the holidays up as high as 160.  Lowest she has seen has been low 100s which is her average.  Last eye exam: Had done in [facility] 2025 Last foot exam: Up-to-date Last A1c:  Lab Results  Component Value Date   HGBA1C 6.9 (H) 05/07/2024   Nephropathy screen indicated?:  Today Last flu, zoster and/or pneumovax:  Immunization History  Administered Date(s) Administered   Fluad Quad(high Dose 65+) 08/15/2019, 08/29/2020, 08/17/2021, 09/08/2022   INFLUENZA, HIGH DOSE SEASONAL PF 08/24/2018, 08/15/2024   Influenza Inj Mdck Quad Pf 09/01/2017   Influenza Split 09/21/2013   Influenza,inj,Quad PF,6+ Mos 09/19/2014, 09/03/2015, 08/04/2016   Influenza-Unspecified 08/30/2017, 08/09/2023   Moderna Covid-19 Vaccine Bivalent Booster 59yrs & up 10/14/2021   Moderna SARS-COV2 Booster Vaccination 06/17/2021, 08/09/2023   Moderna Sars-Covid-2 Vaccination 01/09/2020, 02/06/2020, 10/01/2020   PNEUMOCOCCAL CONJUGATE-20 11/04/2023   Pneumococcal Conjugate-13 10/20/2012, 10/17/2018   Pneumococcal Polysaccharide-23 10/20/2012   Tdap 07/06/2011, 02/03/2024    ROS: No chest pain, shortness of breath, visual disturbance.  No nausea, vomiting, abdominal pain or diarrhea reported.  Mood has been stable.  She continues to have some back pain where she had the compression fracture but was told that there is nothing else that could be done.  She really notices  this when she is doing certain activities or prolonged activities but it is relieved by rest and lidocaine  patches.   Occupation: retired, Marital status: married, Substance use: none Health Maintenance Due  Topic Date Due   Bone Density Scan  05/14/2024   OPHTHALMOLOGY EXAM  06/19/2024   Diabetic kidney evaluation - Urine ACR  07/11/2024   Mammogram  11/01/2024   HEMOGLOBIN A1C  11/06/2024    Immunization History  Administered Date(s) Administered   Fluad Quad(high Dose 65+) 08/15/2019, 08/29/2020, 08/17/2021, 09/08/2022   INFLUENZA, HIGH DOSE SEASONAL PF 08/24/2018, 08/15/2024   Influenza Inj Mdck Quad Pf 09/01/2017   Influenza Split 09/21/2013   Influenza,inj,Quad PF,6+ Mos 09/19/2014, 09/03/2015, 08/04/2016   Influenza-Unspecified 08/30/2017, 08/09/2023   Moderna Covid-19 Vaccine Bivalent Booster 72yrs & up 10/14/2021   Moderna SARS-COV2 Booster Vaccination 06/17/2021, 08/09/2023   Moderna Sars-Covid-2 Vaccination 01/09/2020, 02/06/2020, 10/01/2020   PNEUMOCOCCAL CONJUGATE-20 11/04/2023   Pneumococcal Conjugate-13 10/20/2012, 10/17/2018   Pneumococcal Polysaccharide-23 10/20/2012   Tdap 07/06/2011, 02/03/2024   Past Medical History:  Diagnosis Date   Arthritis    Coronary artery disease    COVID-19 2022   Diabetes mellitus without complication (HCC)    GERD (gastroesophageal reflux disease)    Hyperlipidemia    Hypertension    Palpitations    Vertigo    Social History   Socioeconomic History   Marital status: Married    Spouse name: Erica Evans   Number of children: 2   Years of education: Not on file   Highest education level: Associate degree: academic program  Occupational History   Occupation: geologist, engineering    Comment: retired  Tobacco Use  Smoking status: Former    Current packs/day: 0.00    Types: Cigarettes    Quit date: 1990    Years since quitting: 36.0   Smokeless tobacco: Never  Vaping Use   Vaping status: Never Used  Substance and Sexual  Activity   Alcohol use: No   Drug use: No   Sexual activity: Not on file  Other Topics Concern   Not on file  Social History Narrative   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 She has 3 children. She enjoys bird watching and walking.    Social Drivers of Health   Tobacco Use: Medium Risk (11/26/2024)   Patient History    Smoking Tobacco Use: Former    Smokeless Tobacco Use: Never    Passive Exposure: Not on file  Financial Resource Strain: Medium Risk (04/13/2024)   Overall Financial Resource Strain (CARDIA)    Difficulty of Paying Living Expenses: Somewhat hard  Food Insecurity: No Food Insecurity (04/27/2024)   Epic    Worried About Programme Researcher, Broadcasting/film/video in the Last Year: Never true    Ran Out of Food in the Last Year: Never true  Transportation Needs: No Transportation Needs (04/27/2024)   Epic    Lack of Transportation (Medical): No    Lack of Transportation (Non-Medical): No  Physical Activity: Sufficiently Active (04/13/2024)   Exercise Vital Sign    Days of Exercise per Week: 5 days    Minutes of Exercise per Session: 30 min  Stress: No Stress Concern Present (04/13/2024)   [employer] of Occupational Health - Occupational Stress Questionnaire    Feeling of Stress : Only a little  Social Connections: Moderately Integrated (04/13/2024)   Social Connection and Isolation Panel    Frequency of Communication with Friends and Family: More than three times a week    Frequency of Social Gatherings with Friends and Family: Once a week    Attends Religious Services: More than 4 times per year    Active Member of [employer] or Organizations: No    Attends Banker Meetings: Never    Marital Status: Married  Catering Manager Violence: Not At Risk (04/27/2024)   Epic    Fear of Current or Ex-Partner: No    Emotionally Abused: No    Physically Abused: No    Sexually Abused: No  Depression (PHQ2-9): Low Risk  (11/26/2024)   Depression (PHQ2-9)    PHQ-2 Score: 0  Alcohol Screen: Low Risk (04/13/2024)   Alcohol Screen    Last Alcohol Screening Score (AUDIT): 0  Housing: Low Risk (04/27/2024)   Epic    Unable to Pay for Housing in the Last Year: No    Number of Times Moved in the Last Year: 0    Homeless in the Last Year: No  Utilities: Not At Risk (04/13/2024)   AHC Utilities    Threatened with loss of utilities: No  Health Literacy: Adequate Health Literacy (04/13/2024)   B1300 Health Literacy    Frequency of need for help with medical instructions: Never   Past Surgical History:  Procedure Laterality Date   CATARACT EXTRACTION W/PHACO Left 07/15/2023   Procedure: CATARACT EXTRACTION PHACO AND INTRAOCULAR LENS PLACEMENT (IOC);  Surgeon: Harrie Agent, MD;  Location: [facility];  Service: Ophthalmology;  Laterality: Left;  CDE 18.83   ENDARTERECTOMY Right 12/16/2022   Procedure: RIGHT CAROTID ENDARTERECTOMY;  Surgeon: Lanis Fonda BRAVO, MD;  Location: [facility];  Service: Vascular;  Laterality: Right;   Family History  Problem Relation Age of Onset   Coronary artery disease Mother    Diabetes Mother    Heart disease Mother    Coronary artery disease Father    Heart disease Father    Diabetes Sister    Hypertension Sister    Heart disease Sister    Diabetes Sister    Arthritis Sister    Hypertension Sister    Heart attack Sister    Congestive Heart Failure Sister    Vascular Disease Sister    Diabetes Sister    Hypertension Sister    Hypertension Sister    Diabetes Brother    Current Medications[1]  Allergies[2]   ROS: Review of Systems Pertinent items noted in HPI and remainder of comprehensive ROS otherwise negative.    Physical exam BP 125/79   Pulse 89   Temp (!) 97.5 F (36.4 C)   Ht 5' 7 (1.702 m)   Wt 195 lb 6 oz (88.6 kg)   SpO2 97%   BMI 30.60 kg/m  General appearance: alert, cooperative, appears stated age, no distress, and mildly obese Head: Normocephalic,  without obvious abnormality, atraumatic Eyes: negative findings: lids and lashes normal, conjunctivae and sclerae normal, corneas clear, and pupils equal, round, reactive to light and accomodation Ears: normal TM's and external ear canals both ears Nose: Nares normal. Septum midline. Mucosa normal. No drainage or sinus tenderness. Throat: lips, mucosa, and tongue normal; teeth and gums normal Neck: no adenopathy, no carotid bruit, supple, symmetrical, trachea midline, and thyroid  not enlarged, symmetric, no tenderness/mass/nodules Back: Increased kyphosis of the thoracic spine appreciated with limited extension of the cervical spine.  Increased tonicity of bilateral trapezius Lungs: clear to auscultation bilaterally Heart: regular rate and rhythm, S1, S2 normal, no murmur, click, rub or gallop Abdomen: soft, non-tender; bowel sounds normal; no masses,  no organomegaly Extremities: extremities normal, atraumatic, no cyanosis or edema Pulses: 2+ and symmetric Skin: Skin color, texture, turgor normal. No rashes or lesions Lymph nodes: No supraclavicular or anterior cervical lymphadenopathy Neurologic: Alert and oriented X 3, normal strength and tone. Normal symmetric reflexes. Normal coordination and gait      11/26/2024    9:10 AM 11/16/2024    9:17 AM 10/08/2024    3:12 PM  Depression screen PHQ 2/9  Decreased Interest 0 0 0  Down, Depressed, Hopeless 0 0 0  PHQ - 2 Score 0 0 0  Altered sleeping 0    Tired, decreased energy 0    Change in appetite 0    Feeling bad or failure about yourself  0    Trouble concentrating 0    Moving slowly or fidgety/restless 0    Suicidal thoughts 0    PHQ-9 Score 0    Difficult doing work/chores Not difficult at all        11/26/2024    9:11 AM 11/16/2024    9:17 AM 05/07/2024    9:20 AM 02/03/2024    9:07 AM  GAD 7 : Generalized Anxiety Score  Nervous, Anxious, on Edge 1 1 0 0  Control/stop worrying 0 0 0 0  Worry too much - different things 1 0 0 0   Trouble relaxing 0 0 0 0  Restless 0 0 0 0  Easily annoyed or irritable 0 0 0 0  Afraid - awful might happen 0 0 0 0  Total GAD 7 Score 2 1 0 0  Anxiety Difficulty Not difficult at all  Not difficult at all Not difficult at all     Recent Results (from the past 2160 hours)  Lab report - scanned     Status: None   Collection Time: 09/11/24 12:00 AM  Result Value Ref Range   EGFR      Comment: greater than/equal to 72ml-Abstracted by HIM   Microalb Creat Ratio      Comment: <30 mg/g-Abstracted by HIM     Assessment/ Plan: Donia JAYSON Rand Marny here for annual physical exam.   Annual physical exam  Diabetes mellitus treated with injections of non-insulin  medication (HCC) - Plan: Bayer DCA Hb A1c Waived, Microalbumin / creatinine urine ratio, CMP14+EGFR, CBC with Differential, glipiZIDE  (GLUCOTROL  XL) 10 MG 24 hr tablet, metFORMIN  (GLUCOPHAGE -XR) 500 MG 24 hr tablet, Semaglutide , 2 MG/DOSE, 8 MG/3ML SOPN  Hyperlipidemia associated with type 2 diabetes mellitus (HCC) - Plan: CMP14+EGFR, Lipid Panel, TSH, pravastatin  (PRAVACHOL ) 40 MG tablet  Hypertension associated with diabetes (HCC) - Plan: CMP14+EGFR, amLODipine  (NORVASC ) 10 MG tablet, hydrochlorothiazide  (HYDRODIURIL ) 25 MG tablet  Age-related osteoporosis without current pathological fracture - Plan: CMP14+EGFR, VITAMIN D  25 Hydroxy (Vit-D Deficiency, Fractures), DG [facility] DEXA  GAD (generalized anxiety disorder) - Plan: busPIRone  (BUSPAR ) 15 MG tablet  Vaccination not carried out because of parent refusal   Declined she will vaccination.  Release of information for her eye doctor from [facility] in [place].  Reinforced need to schedule mammogram at [facility] as she is overdue for this.  Fasting labs collected along with urine microalbumin today.  We will try and see if the semaglutide  is covered by her current insurance as this would greatly benefit her given family history of heart disease and her increased risk given  diabetes, hypertension and hyperlipidemia.  In the meantime, we will refill glipizide , metformin  and we will consider transition over to Januvia  if semaglutide  not affordable.  I gave her a sample box of this today just to get a few more weeks of coverage for her  Will continue statin, blood pressure medications.  DEXA scan performed today.  Check vitamin D  and calcium  levels  Mood stable.  Continues to care for her husband.  BuSpar  renewed  Counseled on healthy lifestyle choices, including diet (rich in fruits, vegetables and lean meats and low in salt and simple carbohydrates) and exercise (at least 30 minutes of moderate physical activity daily).  Patient to follow up 6 months and at that time we will perform diabetic foot exam  Mita Vallo M. Slaton Reaser, DO        [1]  Current Outpatient Medications:    ASPIRIN  EC PO, Take 325 mg by mouth at bedtime. Swallow whole., Disp: , Rfl:    Cholecalciferol (D-3-5) 125 MCG (5000 UT) capsule, Take 5,000 Units by mouth in the morning., Disp: , Rfl:    fexofenadine (ALLEGRA) 180 MG tablet, Take 180 mg by mouth as needed for allergies or rhinitis (sinus and allergies)., Disp: , Rfl:    glucose blood (ONETOUCH VERIO) test strip, TEST BLOOD SUGAR ONCE DAILY DX E11.9, Disp: 100 strip, Rfl: 3   lidocaine  (LIDODERM ) 5 %, PLACE 1 PATCH ONTO THE SKIN DAILY. REMOVE & DISCARD PATCH WITHIN 12 HOURS OR AS DIRECTED BY MD (Patient taking differently: Place 1 patch onto the skin as needed. Remove & Discard patch within 12 hours or as directed by MD), Disp: 30 patch, Rfl: PRN   amLODipine  (NORVASC ) 10 MG tablet, Take 1 tablet (10 mg total) by mouth daily., Disp: 90 tablet, Rfl: 4  busPIRone  (BUSPAR ) 15 MG tablet, Take 1 tablet (15 mg total) by mouth 3 (three) times daily., Disp: 270 tablet, Rfl: 3   cyclobenzaprine  (FLEXERIL ) 5 MG tablet, Take 1 tablet (5 mg total) by mouth at bedtime as needed for muscle spasms., Disp: 90 tablet, Rfl: 3   glipiZIDE  (GLUCOTROL  XL) 10  MG 24 hr tablet, Take 1 tablet (10 mg total) by mouth daily with breakfast., Disp: 90 tablet, Rfl: 4   hydrochlorothiazide  (HYDRODIURIL ) 25 MG tablet, Take 1 tablet (25 mg total) by mouth daily., Disp: 90 tablet, Rfl: 4   metFORMIN  (GLUCOPHAGE -XR) 500 MG 24 hr tablet, Take 2 tablets (1,000 mg total) by mouth daily with supper., Disp: 180 tablet, Rfl: 4   pravastatin  (PRAVACHOL ) 40 MG tablet, Take 1 tablet (40 mg total) by mouth daily., Disp: 90 tablet, Rfl: 4   Semaglutide , 2 MG/DOSE, 8 MG/3ML SOPN, Inject 2 mg as directed once a week., Disp: 6 mL, Rfl: 3 [2]  Allergies Allergen Reactions   Jardiance  [Empagliflozin ]     Yeast infections   Lipitor [Atorvastatin]     Myopathy/fibromyalgia    Morphine And Codeine    Pioglitazone      Ankle swelling   Farxiga  [Dapagliflozin ]     UTI/CONSTIPATION   "

## 2024-11-27 ENCOUNTER — Other Ambulatory Visit: Payer: Self-pay | Admitting: Family Medicine

## 2024-11-27 DIAGNOSIS — M810 Age-related osteoporosis without current pathological fracture: Secondary | ICD-10-CM

## 2024-11-27 LAB — CBC WITH DIFFERENTIAL/PLATELET
Basophils Absolute: 0.1 x10E3/uL (ref 0.0–0.2)
Basos: 1 %
EOS (ABSOLUTE): 0.3 x10E3/uL (ref 0.0–0.4)
Eos: 3 %
Hematocrit: 38.3 % (ref 34.0–46.6)
Hemoglobin: 12.1 g/dL (ref 11.1–15.9)
Immature Grans (Abs): 0 x10E3/uL (ref 0.0–0.1)
Immature Granulocytes: 0 %
Lymphocytes Absolute: 3.2 x10E3/uL — ABNORMAL HIGH (ref 0.7–3.1)
Lymphs: 28 %
MCH: 23.2 pg — ABNORMAL LOW (ref 26.6–33.0)
MCHC: 31.6 g/dL (ref 31.5–35.7)
MCV: 74 fL — ABNORMAL LOW (ref 79–97)
Monocytes Absolute: 0.9 x10E3/uL (ref 0.1–0.9)
Monocytes: 8 %
Neutrophils Absolute: 6.7 x10E3/uL (ref 1.4–7.0)
Neutrophils: 60 %
Platelets: 643 x10E3/uL — ABNORMAL HIGH (ref 150–450)
RBC: 5.21 x10E6/uL (ref 3.77–5.28)
RDW: 15.6 % — ABNORMAL HIGH (ref 11.7–15.4)
WBC: 11.2 x10E3/uL — ABNORMAL HIGH (ref 3.4–10.8)

## 2024-11-27 LAB — TSH: TSH: 3.65 u[IU]/mL (ref 0.450–4.500)

## 2024-11-27 LAB — CMP14+EGFR
ALT: 11 IU/L (ref 0–32)
AST: 19 IU/L (ref 0–40)
Albumin: 4.6 g/dL (ref 3.8–4.8)
Alkaline Phosphatase: 91 IU/L (ref 49–135)
BUN/Creatinine Ratio: 12 (ref 12–28)
BUN: 12 mg/dL (ref 8–27)
Bilirubin Total: 0.3 mg/dL (ref 0.0–1.2)
CO2: 25 mmol/L (ref 20–29)
Calcium: 10.1 mg/dL (ref 8.7–10.3)
Chloride: 94 mmol/L — ABNORMAL LOW (ref 96–106)
Creatinine, Ser: 1.03 mg/dL — ABNORMAL HIGH (ref 0.57–1.00)
Globulin, Total: 3 g/dL (ref 1.5–4.5)
Glucose: 119 mg/dL — ABNORMAL HIGH (ref 70–99)
Potassium: 5.3 mmol/L — ABNORMAL HIGH (ref 3.5–5.2)
Sodium: 139 mmol/L (ref 134–144)
Total Protein: 7.6 g/dL (ref 6.0–8.5)
eGFR: 58 mL/min/1.73 — ABNORMAL LOW

## 2024-11-27 LAB — MICROALBUMIN / CREATININE URINE RATIO
Creatinine, Urine: 162.4 mg/dL
Microalb/Creat Ratio: 27 mg/g{creat} (ref 0–29)
Microalbumin, Urine: 44.4 ug/mL

## 2024-11-27 LAB — LIPID PANEL
Chol/HDL Ratio: 2.6 ratio (ref 0.0–4.4)
Cholesterol, Total: 162 mg/dL (ref 100–199)
HDL: 63 mg/dL
LDL Chol Calc (NIH): 70 mg/dL (ref 0–99)
Triglycerides: 173 mg/dL — ABNORMAL HIGH (ref 0–149)
VLDL Cholesterol Cal: 29 mg/dL (ref 5–40)

## 2024-11-27 LAB — VITAMIN D 25 HYDROXY (VIT D DEFICIENCY, FRACTURES): Vit D, 25-Hydroxy: 62.8 ng/mL (ref 30.0–100.0)

## 2024-11-27 MED ORDER — RISEDRONATE SODIUM 150 MG PO TABS: 150.0000 mg | ORAL_TABLET | ORAL | 4 refills | Status: AC

## 2024-12-04 ENCOUNTER — Ambulatory Visit (INDEPENDENT_AMBULATORY_CARE_PROVIDER_SITE_OTHER)

## 2024-12-04 DIAGNOSIS — I6523 Occlusion and stenosis of bilateral carotid arteries: Secondary | ICD-10-CM | POA: Diagnosis not present

## 2024-12-04 DIAGNOSIS — Z9889 Other specified postprocedural states: Secondary | ICD-10-CM | POA: Diagnosis not present

## 2024-12-05 ENCOUNTER — Telehealth: Payer: Self-pay

## 2024-12-05 ENCOUNTER — Other Ambulatory Visit (HOSPITAL_COMMUNITY): Payer: Self-pay

## 2024-12-05 NOTE — Telephone Encounter (Signed)
 Pharmacy Patient Advocate Encounter   Received notification from PATIENTS INSURANCE that prior authorization for OZEMPIC  2MG  DOSE PENS is required/requested.   Insurance verification completed.   The patient is insured through [employer].   Per test claim: PA required; PA started via CoverMyMeds. KEY [id number] . Waiting for clinical questions to populate.

## 2024-12-06 NOTE — Telephone Encounter (Signed)
 Pharmacy Patient Advocate Encounter   PA required; PA submitted to above mentioned insurance via Latent Key/confirmation #/EOC [facility]. Status is pending

## 2024-12-10 NOTE — Telephone Encounter (Signed)
 Pharmacy Patient Advocate Encounter  Received notification from [facility] that Prior Authorization for OZEMPIC  2MG  DOSE has been APPROVED from 12/06/24 to 12/06/25   PA #/Case ID/Reference #: [id number]

## 2024-12-11 ENCOUNTER — Ambulatory Visit

## 2024-12-17 ENCOUNTER — Other Ambulatory Visit: Payer: Self-pay | Admitting: *Deleted

## 2024-12-17 NOTE — Patient Instructions (Signed)
 Visit Information  Thank you for taking time to visit with me today. Please don't hesitate to contact me if I can be of assistance to you before our next scheduled appointment.  Your next care management appointment is by telephone on 01-14-2025 at 9:00 am  Telephone follow-up in 1 month  Please call the care guide team at [phone] if you need to cancel, schedule, or reschedule an appointment.   Please call the Suicide and Crisis Lifeline: 988 call the [place]  National Suicide Prevention Lifeline: 208-038-4168 or TTY: 985-025-2018 TTY 365-851-4445) to talk to a trained counselor call 1-800-273-TALK (toll free, 24 hour hotline) if you are experiencing a Mental Health or Behavioral Health Crisis or need someone to talk to.  Rosina Forte, BSN RN [facility], [facility] RN Care Manager Direct Dial: [phone]  Fax: [phone]

## 2025-01-08 ENCOUNTER — Ambulatory Visit

## 2025-01-14 ENCOUNTER — Telehealth: Admitting: *Deleted

## 2025-05-27 ENCOUNTER — Ambulatory Visit: Admitting: Family Medicine
